# Patient Record
Sex: Female | Born: 1975 | Race: White | Hispanic: No | Smoking: Former smoker
Health system: Southern US, Community
[De-identification: ages and names within clinical notes are randomized; demographics above are authoritative.]

## PROBLEM LIST (undated history)

## (undated) DIAGNOSIS — F3130 Bipolar disorder, current episode depressed, mild or moderate severity, unspecified: Secondary | ICD-10-CM

## (undated) DIAGNOSIS — F329 Major depressive disorder, single episode, unspecified: Secondary | ICD-10-CM

## (undated) DIAGNOSIS — F419 Anxiety disorder, unspecified: Secondary | ICD-10-CM

## (undated) DIAGNOSIS — F32A Depression, unspecified: Secondary | ICD-10-CM

## (undated) HISTORY — PX: LAPAROSCOPIC GASTRIC SLEEVE RESECTION: SHX5895

## (undated) HISTORY — PX: TONSILLECTOMY: SUR1361

## (undated) HISTORY — PX: CHOLECYSTECTOMY: SHX55

---

## 2005-01-30 ENCOUNTER — Ambulatory Visit: Payer: Self-pay | Admitting: Family Medicine

## 2005-02-28 ENCOUNTER — Ambulatory Visit: Payer: Self-pay | Admitting: Family Medicine

## 2006-12-25 ENCOUNTER — Ambulatory Visit: Payer: Self-pay | Admitting: Family Medicine

## 2010-11-09 ENCOUNTER — Ambulatory Visit (HOSPITAL_COMMUNITY)
Admission: RE | Admit: 2010-11-09 | Discharge: 2010-11-09 | Disposition: A | Discharge status: Home / Self Care | Payer: BLUE CROSS/BLUE SHIELD | Source: Ambulatory Visit | Attending: Family Medicine | Admitting: Family Medicine

## 2010-11-09 ENCOUNTER — Other Ambulatory Visit (HOSPITAL_COMMUNITY): Payer: Self-pay | Admitting: Family Medicine

## 2010-11-09 DIAGNOSIS — R1032 Left lower quadrant pain: Secondary | ICD-10-CM

## 2010-11-09 DIAGNOSIS — K7689 Other specified diseases of liver: Secondary | ICD-10-CM | POA: Insufficient documentation

## 2010-11-09 DIAGNOSIS — K838 Other specified diseases of biliary tract: Secondary | ICD-10-CM | POA: Insufficient documentation

## 2010-11-12 ENCOUNTER — Other Ambulatory Visit (HOSPITAL_COMMUNITY): Payer: Self-pay | Admitting: Family Medicine

## 2010-11-12 DIAGNOSIS — K921 Melena: Secondary | ICD-10-CM

## 2010-11-20 ENCOUNTER — Encounter (HOSPITAL_COMMUNITY)
Admission: RE | Admit: 2010-11-20 | Discharge: 2010-11-20 | Disposition: A | Discharge status: Home / Self Care | Payer: BLUE CROSS/BLUE SHIELD | Source: Ambulatory Visit | Attending: Family Medicine | Admitting: Family Medicine

## 2010-11-20 DIAGNOSIS — K921 Melena: Secondary | ICD-10-CM

## 2010-11-20 DIAGNOSIS — K838 Other specified diseases of biliary tract: Secondary | ICD-10-CM | POA: Insufficient documentation

## 2010-11-20 DIAGNOSIS — R109 Unspecified abdominal pain: Secondary | ICD-10-CM | POA: Insufficient documentation

## 2010-11-20 MED ORDER — SINCALIDE 5 MCG IJ SOLR: 0.0200 ug/kg | Freq: Once | INTRAMUSCULAR | Status: DC

## 2010-11-20 MED ORDER — TECHNETIUM TC 99M MEBROFENIN IV KIT: 5.5000 | PACK | Freq: Once | INTRAVENOUS | Status: AC | PRN

## 2012-08-17 ENCOUNTER — Emergency Department: Payer: Self-pay | Admitting: Emergency Medicine

## 2012-08-17 LAB — COMPREHENSIVE METABOLIC PANEL
Albumin: 3 g/dL — ABNORMAL LOW (ref 3.4–5.0)
Bilirubin,Total: 0.8 mg/dL (ref 0.2–1.0)
Co2: 26 mmol/L (ref 21–32)
Creatinine: 0.69 mg/dL (ref 0.60–1.30)
EGFR (African American): >60
EGFR (Non-African Amer.): >60
Glucose: 97 mg/dL (ref 65–99)
Osmolality: 280 (ref 275–301)
SGOT(AST): 40 U/L — ABNORMAL HIGH (ref 15–37)
Sodium: 141 mmol/L (ref 136–145)
Total Protein: 7.3 g/dL (ref 6.4–8.2)

## 2012-08-17 LAB — URINALYSIS, COMPLETE
Bacteria: NONE SEEN
Bilirubin,UR: NEGATIVE
Ketone: NEGATIVE
Leukocyte Esterase: NEGATIVE
Nitrite: NEGATIVE
Protein: NEGATIVE
RBC,UR: 15 /HPF (ref 0–5)
Squamous Epithelial: <1

## 2012-08-17 LAB — CBC
HCT: 38.5 % (ref 35.0–47.0)
MCV: 87 fL (ref 80–100)
RDW: 12.9 % (ref 11.5–14.5)

## 2012-09-22 ENCOUNTER — Emergency Department: Payer: Self-pay

## 2012-09-22 LAB — CBC
HGB: 13.1 g/dL (ref 12.0–16.0)
MCH: 28.8 pg (ref 26.0–34.0)
MCHC: 32.5 g/dL (ref 32.0–36.0)
MCV: 89 fL (ref 80–100)
RBC: 4.56 10*6/uL (ref 3.80–5.20)
WBC: 7.2 10*3/uL (ref 3.6–11.0)

## 2012-09-22 LAB — COMPREHENSIVE METABOLIC PANEL
Albumin: 3.5 g/dL (ref 3.4–5.0)
Anion Gap: 5 — ABNORMAL LOW (ref 7–16)
BUN: 13 mg/dL (ref 7–18)
Bilirubin,Total: 1.3 mg/dL — ABNORMAL HIGH (ref 0.2–1.0)
Calcium, Total: 8.7 mg/dL (ref 8.5–10.1)
Co2: 29 mmol/L (ref 21–32)
EGFR (African American): >60
EGFR (Non-African Amer.): >60
Glucose: 132 mg/dL — ABNORMAL HIGH (ref 65–99)
Potassium: 3.4 mmol/L — ABNORMAL LOW (ref 3.5–5.1)
Sodium: 140 mmol/L (ref 136–145)

## 2012-09-22 LAB — URINALYSIS, COMPLETE
Glucose,UR: NEGATIVE mg/dL (ref 0–75)
Ketone: NEGATIVE
Nitrite: NEGATIVE
Ph: 5 (ref 4.5–8.0)
Protein: NEGATIVE
RBC,UR: 2 /HPF (ref 0–5)
Squamous Epithelial: 9

## 2012-09-22 LAB — LIPASE, BLOOD: Lipase: 84 U/L (ref 73–393)

## 2014-02-22 ENCOUNTER — Encounter: Payer: BC Managed Care – PPO | Admitting: Family Medicine

## 2014-09-19 ENCOUNTER — Emergency Department: Payer: Self-pay | Admitting: Emergency Medicine

## 2015-04-04 ENCOUNTER — Encounter: Payer: Self-pay | Admitting: Emergency Medicine

## 2015-04-04 ENCOUNTER — Emergency Department
Admission: EM | Admit: 2015-04-04 | Discharge: 2015-04-04 | Disposition: A | Discharge status: Home / Self Care | Payer: Self-pay | Attending: Emergency Medicine | Admitting: Emergency Medicine

## 2015-04-04 DIAGNOSIS — F1994 Other psychoactive substance use, unspecified with psychoactive substance-induced mood disorder: Secondary | ICD-10-CM

## 2015-04-04 DIAGNOSIS — F32A Depression, unspecified: Secondary | ICD-10-CM

## 2015-04-04 DIAGNOSIS — T424X1A Poisoning by benzodiazepines, accidental (unintentional), initial encounter: Secondary | ICD-10-CM

## 2015-04-04 DIAGNOSIS — F1310 Sedative, hypnotic or anxiolytic abuse, uncomplicated: Secondary | ICD-10-CM | POA: Insufficient documentation

## 2015-04-04 DIAGNOSIS — F10920 Alcohol use, unspecified with intoxication, uncomplicated: Secondary | ICD-10-CM

## 2015-04-04 DIAGNOSIS — F10120 Alcohol abuse with intoxication, uncomplicated: Secondary | ICD-10-CM | POA: Insufficient documentation

## 2015-04-04 DIAGNOSIS — Y998 Other external cause status: Secondary | ICD-10-CM | POA: Insufficient documentation

## 2015-04-04 DIAGNOSIS — Y9389 Activity, other specified: Secondary | ICD-10-CM | POA: Insufficient documentation

## 2015-04-04 DIAGNOSIS — N390 Urinary tract infection, site not specified: Secondary | ICD-10-CM | POA: Insufficient documentation

## 2015-04-04 DIAGNOSIS — F329 Major depressive disorder, single episode, unspecified: Secondary | ICD-10-CM

## 2015-04-04 DIAGNOSIS — Z3202 Encounter for pregnancy test, result negative: Secondary | ICD-10-CM | POA: Insufficient documentation

## 2015-04-04 DIAGNOSIS — F3130 Bipolar disorder, current episode depressed, mild or moderate severity, unspecified: Secondary | ICD-10-CM | POA: Insufficient documentation

## 2015-04-04 DIAGNOSIS — Z720 Tobacco use: Secondary | ICD-10-CM | POA: Insufficient documentation

## 2015-04-04 DIAGNOSIS — Z046 Encounter for general psychiatric examination, requested by authority: Secondary | ICD-10-CM

## 2015-04-04 DIAGNOSIS — Y9289 Other specified places as the place of occurrence of the external cause: Secondary | ICD-10-CM | POA: Insufficient documentation

## 2015-04-04 DIAGNOSIS — F1010 Alcohol abuse, uncomplicated: Secondary | ICD-10-CM

## 2015-04-04 DIAGNOSIS — T424X2A Poisoning by benzodiazepines, intentional self-harm, initial encounter: Secondary | ICD-10-CM | POA: Insufficient documentation

## 2015-04-04 HISTORY — DX: Major depressive disorder, single episode, unspecified: F32.9

## 2015-04-04 HISTORY — DX: Depression, unspecified: F32.A

## 2015-04-04 HISTORY — DX: Bipolar disorder, current episode depressed, mild or moderate severity, unspecified: F31.30

## 2015-04-04 HISTORY — DX: Anxiety disorder, unspecified: F41.9

## 2015-04-04 LAB — CBC WITH DIFFERENTIAL/PLATELET
Basophils Absolute: 0 10*3/uL (ref 0–0.1)
Basophils Relative: 1 %
EOS ABS: 0.1 10*3/uL (ref 0–0.7)
EOS PCT: 3 %
HCT: 42.3 % (ref 35.0–47.0)
Hemoglobin: 14.3 g/dL (ref 12.0–16.0)
LYMPHS ABS: 1.9 10*3/uL (ref 1.0–3.6)
Lymphocytes Relative: 42 %
MCH: 29.6 pg (ref 26.0–34.0)
MCHC: 33.7 g/dL (ref 32.0–36.0)
MCV: 87.9 fL (ref 80.0–100.0)
MONO ABS: 0.3 10*3/uL (ref 0.2–0.9)
MONOS PCT: 7 %
Neutro Abs: 2.1 10*3/uL (ref 1.4–6.5)
Neutrophils Relative %: 47 %
PLATELETS: 127 10*3/uL — AB (ref 150–440)
RBC: 4.81 MIL/uL (ref 3.80–5.20)
RDW: 15.4 % — ABNORMAL HIGH (ref 11.5–14.5)
WBC: 4.5 10*3/uL (ref 3.6–11.0)

## 2015-04-04 LAB — URINE DRUG SCREEN, QUALITATIVE (ARMC ONLY)
Amphetamines, Ur Screen: NOT DETECTED
Barbiturates, Ur Screen: NOT DETECTED
Benzodiazepine, Ur Scrn: POSITIVE — AB
Cannabinoid 50 Ng, Ur ~~LOC~~: NOT DETECTED
Cocaine Metabolite,Ur ~~LOC~~: NOT DETECTED
MDMA (Ecstasy)Ur Screen: NOT DETECTED
Methadone Scn, Ur: NOT DETECTED
Opiate, Ur Screen: NOT DETECTED
Phencyclidine (PCP) Ur S: NOT DETECTED
Tricyclic, Ur Screen: NOT DETECTED

## 2015-04-04 LAB — URINALYSIS COMPLETE WITH MICROSCOPIC (ARMC ONLY)
Bacteria, UA: NONE SEEN
Bilirubin Urine: NEGATIVE
Glucose, UA: NEGATIVE mg/dL
Ketones, ur: NEGATIVE mg/dL
Nitrite: NEGATIVE
Protein, ur: NEGATIVE mg/dL
Specific Gravity, Urine: 1.003 — ABNORMAL LOW (ref 1.005–1.030)
pH: 6 (ref 5.0–8.0)

## 2015-04-04 LAB — COMPREHENSIVE METABOLIC PANEL
ALT: 16 U/L (ref 14–54)
AST: 24 U/L (ref 15–41)
Albumin: 3.7 g/dL (ref 3.5–5.0)
Alkaline Phosphatase: 76 U/L (ref 38–126)
Anion gap: 6 (ref 5–15)
BUN: 9 mg/dL (ref 6–20)
CHLORIDE: 107 mmol/L (ref 101–111)
CO2: 27 mmol/L (ref 22–32)
CREATININE: 0.72 mg/dL (ref 0.44–1.00)
Calcium: 9.1 mg/dL (ref 8.9–10.3)
GFR calc Af Amer: >60 mL/min (ref >60)
GLUCOSE: 96 mg/dL (ref 65–99)
Potassium: 3.9 mmol/L (ref 3.5–5.1)
Sodium: 140 mmol/L (ref 135–145)
Total Bilirubin: 0.6 mg/dL (ref 0.3–1.2)
Total Protein: 7 g/dL (ref 6.5–8.1)

## 2015-04-04 LAB — PREGNANCY, URINE: Preg Test, Ur: NEGATIVE

## 2015-04-04 LAB — ACETAMINOPHEN LEVEL: Acetaminophen (Tylenol), Serum: <10 ug/mL — ABNORMAL LOW (ref 10–30)

## 2015-04-04 LAB — SALICYLATE LEVEL: Salicylate Lvl: <4 mg/dL (ref 2.8–30.0)

## 2015-04-04 LAB — ETHANOL: ALCOHOL ETHYL (B): 194 mg/dL — AB (ref <5)

## 2015-04-04 MED ORDER — SODIUM CHLORIDE 0.9 % IV SOLN
Freq: Once | INTRAVENOUS | Status: AC
  Administered 2015-04-04: 09:00:00 via INTRAVENOUS

## 2015-04-04 MED ORDER — SODIUM CHLORIDE 0.9 % IV BOLUS (SEPSIS)
1000.0000 mL | INTRAVENOUS | Status: AC
  Administered 2015-04-04: 1000 mL via INTRAVENOUS

## 2015-04-04 MED ORDER — CEPHALEXIN 500 MG PO CAPS: 500.0000 mg | ORAL_CAPSULE | Freq: Two times a day (BID) | ORAL | Status: AC

## 2015-04-04 MED ORDER — CEPHALEXIN 500 MG PO CAPS
500.0000 mg | ORAL_CAPSULE | Freq: Two times a day (BID) | ORAL | Status: DC
  Administered 2015-04-04: 500 mg via ORAL
  Filled 2015-04-04 (×2): qty 1

## 2015-04-04 NOTE — ED Notes (Signed)
Ok per dr Darnelle Catalan to remove pt from monitor and place in [place].

## 2015-04-04 NOTE — ED Notes (Signed)
BEHAVIORAL HEALTH ROUNDING Patient sleeping: No. Patient alert and oriented: yes Behavior appropriate: Yes.  ;  Nutrition and fluids offered: Yes  Toileting and hygiene offered: Yes  Sitter present: yes Law enforcement present: Yes  

## 2015-04-04 NOTE — ED Notes (Signed)
Pt resting quietly in stretcher. NAD noted. RR Even and nonlabored. Pt assisted on bedpan. Will continue to monitor.

## 2015-04-04 NOTE — ED Notes (Signed)
Pt resting in bed quietly in no distress 

## 2015-04-04 NOTE — BH Specialist Note (Signed)
TTS unable to complete triage assessment at this time. Patient is unable to stay awake to complete assessment.

## 2015-04-04 NOTE — ED Notes (Signed)
BEHAVIORAL HEALTH ROUNDING Patient sleeping: No. Patient alert and oriented: yes Behavior appropriate: Yes.   Nutrition and fluids offered: Yes  Toileting and hygiene offered: Yes  Sitter present: yes Law enforcement present: Yes   Pt resting in bed quietly, pt awake and alert  ENVIRONMENTAL ASSESSMENT Potentially harmful objects out of patient reach: Yes.   Personal belongings secured: Yes.   Patient dressed in hospital provided attire only: Yes.   Plastic bags out of patient reach: Yes.   Patient care equipment (cords, cables, call bells, lines, and drains) shortened, removed, or accounted for: Yes.   Equipment and supplies removed from bottom of stretcher: Yes.   Potentially toxic materials out of patient reach: Yes.   Sharps container removed or out of patient reach: Yes.

## 2015-04-04 NOTE — ED Notes (Signed)
Pt's husband, Genevie Cheshire, can be reached at [phone] or [phone].

## 2015-04-04 NOTE — ED Notes (Signed)
BEHAVIORAL HEALTH ROUNDING Patient sleeping: No. Patient alert and oriented: yes Behavior appropriate: Yes.  ; If no, describe:  Nutrition and fluids offered: Yes  Toileting and hygiene offered: No Sitter present: no Law enforcement present: Yes  

## 2015-04-04 NOTE — ED Notes (Signed)
Husband- Milady Fleener [phone]

## 2015-04-04 NOTE — ED Notes (Signed)
Report given to Mary, rN. 

## 2015-04-04 NOTE — Discharge Instructions (Signed)
Alcohol Use Disorder °Alcohol use disorder is a mental disorder. It is not a one-time incident of heavy drinking. Alcohol use disorder is the excessive and uncontrollable use of alcohol over time that leads to problems with functioning in one or more areas of daily living. People with this disorder risk harming themselves and others when they drink to excess. Alcohol use disorder also can cause other mental disorders, such as mood and anxiety disorders, and serious physical problems. People with alcohol use disorder often misuse other drugs.  °Alcohol use disorder is common and widespread. Some people with this disorder drink alcohol to cope with or escape from negative life events. Others drink to relieve chronic pain or symptoms of mental illness. People with a family history of alcohol use disorder are at higher risk of losing control and using alcohol to excess.  °SYMPTOMS  °Signs and symptoms of alcohol use disorder may include the following:  °· Consumption of alcohol in larger amounts or over a longer period of time than intended. °· Multiple unsuccessful attempts to cut down or control alcohol use.   °· A great deal of time spent obtaining alcohol, using alcohol, or recovering from the effects of alcohol (hangover). °· A strong desire or urge to use alcohol (cravings).   °· Continued use of alcohol despite problems at work, school, or home because of alcohol use.   °· Continued use of alcohol despite problems in relationships because of alcohol use. °· Continued use of alcohol in situations when it is physically hazardous, such as driving a car. °· Continued use of alcohol despite awareness of a physical or psychological problem that is likely related to alcohol use. Physical problems related to alcohol use can involve the brain, heart, liver, stomach, and intestines. Psychological problems related to alcohol use include intoxication, depression, anxiety, psychosis, delirium, and dementia.   °· The need for  increased amounts of alcohol to achieve the same desired effect, or a decreased effect from the consumption of the same amount of alcohol (tolerance). °· Withdrawal symptoms upon reducing or stopping alcohol use, or alcohol use to reduce or avoid withdrawal symptoms. Withdrawal symptoms include: °· Racing heart. °· Hand tremor. °· Difficulty sleeping. °· Nausea. °· Vomiting. °· Hallucinations. °· Restlessness. °· Seizures. °DIAGNOSIS °Alcohol use disorder is diagnosed through an assessment by your health care provider. Your health care provider may start by asking three or four questions to screen for excessive or problematic alcohol use. To confirm a diagnosis of alcohol use disorder, at least two symptoms must be present within a 12-month period. The severity of alcohol use disorder depends on the number of symptoms: °· Mild--two or three. °· Moderate--four or five. °· Severe--six or more. °Your health care provider may perform a physical exam or use results from lab tests to see if you have physical problems resulting from alcohol use. Your health care provider may refer you to a mental health professional for evaluation. °TREATMENT  °Some people with alcohol use disorder are able to reduce their alcohol use to low-risk levels. Some people with alcohol use disorder need to quit drinking alcohol. When necessary, mental health professionals with specialized training in substance use treatment can help. Your health care provider can help you decide how severe your alcohol use disorder is and what type of treatment you need. The following forms of treatment are available:  °· Detoxification. Detoxification involves the use of prescription medicines to prevent alcohol withdrawal symptoms in the first week after quitting. This is important for people with a history of symptoms   of withdrawal and for heavy drinkers who are likely to have withdrawal symptoms. Alcohol withdrawal can be dangerous and, in severe cases, cause  death. Detoxification is usually provided in a hospital or in-patient substance use treatment facility.  Counseling or talk therapy. Talk therapy is provided by substance use treatment counselors. It addresses the reasons people use alcohol and ways to keep them from drinking again. The goals of talk therapy are to help people with alcohol use disorder find healthy activities and ways to cope with life stress, to identify and avoid triggers for alcohol use, and to handle cravings, which can cause relapse.  Medicines.Different medicines can help treat alcohol use disorder through the following actions:  Decrease alcohol cravings.  Decrease the positive reward response felt from alcohol use.  Produce an uncomfortable physical reaction when alcohol is used (aversion therapy).  Support groups. Support groups are run by people who have quit drinking. They provide emotional support, advice, and guidance. These forms of treatment are often combined. Some people with alcohol use disorder benefit from intensive combination treatment provided by specialized substance use treatment centers. Both inpatient and outpatient treatment programs are available. Document Released: 08/21/2004 Document Revised: 11/28/2013 Document Reviewed: 10/21/2012 [facility] Patient Information 2015 [place], [place]. This information is not intended to replace advice given to you by your health care provider. Make sure you discuss any questions you have with your health care provider.  Depression Depression refers to feeling sad, low, down in the dumps, blue, gloomy, or empty. In general, there are two kinds of depression:  Normal sadness or normal grief. This kind of depression is one that we all feel from time to time after upsetting life experiences, such as the loss of a job or the ending of a relationship. This kind of depression is considered normal, is short lived, and resolves within a few days to 2 weeks. Depression  experienced after the loss of a loved one (bereavement) often lasts longer than 2 weeks but normally gets better with time.  Clinical depression. This kind of depression lasts longer than normal sadness or normal grief or interferes with your ability to function at home, at work, and in school. It also interferes with your personal relationships. It affects almost every aspect of your life. Clinical depression is an illness. Symptoms of depression can also be caused by conditions other than those mentioned above, such as:  Physical illness. Some physical illnesses, including underactive thyroid gland (hypothyroidism), severe anemia, specific types of cancer, diabetes, uncontrolled seizures, heart and lung problems, strokes, and chronic pain are commonly associated with symptoms of depression.  Side effects of some prescription medicine. In some people, certain types of medicine can cause symptoms of depression.  Substance abuse. Abuse of alcohol and illicit drugs can cause symptoms of depression. SYMPTOMS Symptoms of normal sadness and normal grief include the following:  Feeling sad or crying for short periods of time.  Not caring about anything (apathy).  Difficulty sleeping or sleeping too much.  No longer able to enjoy the things you used to enjoy.  Desire to be by oneself all the time (social isolation).  Lack of energy or motivation.  Difficulty concentrating or remembering.  Change in appetite or weight.  Restlessness or agitation. Symptoms of clinical depression include the same symptoms of normal sadness or normal grief and also the following symptoms:  Feeling sad or crying all the time.  Feelings of guilt or worthlessness.  Feelings of hopelessness or helplessness.  Thoughts of suicide or  the desire to harm yourself (suicidal ideation).  Loss of touch with reality (psychotic symptoms). Seeing or hearing things that are not real (hallucinations) or having false  beliefs about your life or the people around you (delusions and paranoia). DIAGNOSIS  The diagnosis of clinical depression is usually based on how bad the symptoms are and how long they have lasted. Your health care provider will also ask you questions about your medical history and substance use to find out if physical illness, use of prescription medicine, or substance abuse is causing your depression. Your health care provider may also order blood tests. TREATMENT  Often, normal sadness and normal grief do not require treatment. However, sometimes antidepressant medicine is given for bereavement to ease the depressive symptoms until they resolve. The treatment for clinical depression depends on how bad the symptoms are but often includes antidepressant medicine, counseling with a mental health professional, or both. Your health care provider will help to determine what treatment is best for you. Depression caused by physical illness usually goes away with appropriate medical treatment of the illness. If prescription medicine is causing depression, talk with your health care provider about stopping the medicine, decreasing the dose, or changing to another medicine. Depression caused by the abuse of alcohol or illicit drugs goes away when you stop using these substances. Some adults need professional help in order to stop drinking or using drugs. SEEK IMMEDIATE MEDICAL CARE IF:  You have thoughts about hurting yourself or others.  You lose touch with reality (have psychotic symptoms).  You are taking medicine for depression and have a serious side effect. FOR MORE INFORMATION  National Alliance on Mental Illness: www.nami.[employer] of Mental Health: http://www.maynard.net/ Document Released: 07/11/2000 Document Revised: 11/28/2013 Document Reviewed: 10/13/2011 [facility] Patient Information 2015 [place], [place]. This information is not intended to replace advice given to you by your  health care provider. Make sure you discuss any questions you have with your health care provider.  Urinary Tract Infection Urinary tract infections (UTIs) can develop anywhere along your urinary tract. Your urinary tract is your body's drainage system for removing wastes and extra water. Your urinary tract includes two kidneys, two ureters, a bladder, and a urethra. Your kidneys are a pair of bean-shaped organs. Each kidney is about the size of your fist. They are located below your ribs, one on each side of your spine. CAUSES Infections are caused by microbes, which are microscopic organisms, including fungi, viruses, and bacteria. These organisms are so small that they can only be seen through a microscope. Bacteria are the microbes that most commonly cause UTIs. SYMPTOMS  Symptoms of UTIs may vary by age and gender of the patient and by the location of the infection. Symptoms in young women typically include a frequent and intense urge to urinate and a painful, burning feeling in the bladder or urethra during urination. Older women and men are more likely to be tired, shaky, and weak and have muscle aches and abdominal pain. A fever may mean the infection is in your kidneys. Other symptoms of a kidney infection include pain in your back or sides below the ribs, nausea, and vomiting. DIAGNOSIS To diagnose a UTI, your caregiver will ask you about your symptoms. Your caregiver also will ask to provide a urine sample. The urine sample will be tested for bacteria and white blood cells. White blood cells are made by your body to help fight infection. TREATMENT  Typically, UTIs can be treated with medication.  Because most UTIs are caused by a bacterial infection, they usually can be treated with the use of antibiotics. The choice of antibiotic and length of treatment depend on your symptoms and the type of bacteria causing your infection. HOME CARE INSTRUCTIONS  If you were prescribed antibiotics, take  them exactly as your caregiver instructs you. Finish the medication even if you feel better after you have only taken some of the medication.  Drink enough water and fluids to keep your urine clear or pale yellow.  Avoid caffeine, tea, and carbonated beverages. They tend to irritate your bladder.  Empty your bladder often. Avoid holding urine for long periods of time.  Empty your bladder before and after sexual intercourse.  After a bowel movement, women should cleanse from front to back. Use each tissue only once. SEEK MEDICAL CARE IF:   You have back pain.  You develop a fever.  Your symptoms do not begin to resolve within 3 days. SEEK IMMEDIATE MEDICAL CARE IF:   You have severe back pain or lower abdominal pain.  You develop chills.  You have nausea or vomiting.  You have continued burning or discomfort with urination. MAKE SURE YOU:   Understand these instructions.  Will watch your condition.  Will get help right away if you are not doing well or get worse. Document Released: 04/23/2005 Document Revised: 01/13/2012 Document Reviewed: 08/22/2011 [facility] Patient Information 2015 [place], [place]. This information is not intended to replace advice given to you by your health care provider. Make sure you discuss any questions you have with your health care provider.

## 2015-04-04 NOTE — ED Notes (Signed)
BEHAVIORAL HEALTH ROUNDING Patient sleeping: Yes.   Patient alert and oriented: not applicable Behavior appropriate: Yes.  ; If no, describe:  Nutrition and fluids offered: Yes  and No Toileting and hygiene offered: No Sitter present: no Law enforcement present: Yes

## 2015-04-04 NOTE — ED Notes (Addendum)

## 2015-04-04 NOTE — ED Notes (Signed)
BEHAVIORAL HEALTH ROUNDING Patient sleeping: No. Patient alert and oriented: yes Behavior appropriate: Yes.  ; If no, describe:  Nutrition and fluids offered: Yes  Toileting and hygiene offered: Yes  Sitter present: no Law enforcement present: Yes  

## 2015-04-04 NOTE — ED Notes (Signed)
Pt given phone for husband's phone call.

## 2015-04-04 NOTE — ED Notes (Signed)
IV not removed; wrong pt

## 2015-04-04 NOTE — ED Notes (Signed)
Pt given phone to call her workplace.

## 2015-04-04 NOTE — ED Notes (Signed)
BEHAVIORAL HEALTH ROUNDING Patient sleeping: Yes.   Patient alert and oriented: not applicable Behavior appropriate: Yes.  ; If no, describe:  Nutrition and fluids offered: Yes  Toileting and hygiene offered: No Sitter present: no Law enforcement present: Yes  

## 2015-04-04 NOTE — Consult Note (Signed)
[facility] Consult   Reason for Consult:  This is a consult for this 39 year old woman who came in to the hospital after overdosing on Xanax Referring Physician:  Cinda Quest Patient Identification: Brandy Cooper MRN:  [id number] Principal Diagnosis: Substance induced mood disorder Diagnosis:   Patient Active Problem List   Diagnosis Date Noted  . Substance induced mood disorder [F19.94] 04/04/2015  . Alcohol abuse [F10.10] 04/04/2015  . Benzodiazepine overdose [T42.4X1A] 04/04/2015    Total Time spent with patient: 1 hour  Subjective:   Brandy Cooper is a 39 y.o. female patient admitted with "I just did something stupid last night".  HPI:  Patient came into the hospital after overdosing on Xanax. She states that she and her husband were drinking and he "dared her" to take her entire bottle of Xanax. She says that she thinks that he was just kidding about it but she was so drunk she impulsively agreed to do it. She says she swallowed her entire bottle of Xanax tablets and then went to try and throw them up. She was unable to get herself to vomit and eventually she passed out in the bathroom at which point it sounds like her husband called 911. Patient says that she drinks about one or 2 times a week and minimizes the amount of it. Claims that she only had 2 drinks last night. She denies that she abuses her Xanax or abuses any other drugs. She says that she has not been feeling depressed. Denies any suicidal thoughts. Denies any psychotic symptoms.  Past psychiatric history: No previous psychiatric hospitalization. No history of suicide attempts or violence. She says that she is treated for depression and anxiety by her primary care doctor. She takes Wellbutrin during the day and Xanax as needed at night to help her sleep. History of psychotic symptoms.  Substance abuse history: Patient minimizes the degree to this but obviously this degree of drinking last night was  more than what she thought. She had a blood alcohol level almost 200 even presumably a couple hours after stopping drinking. Patient denies any history of DTs or seizures. Denies any other substance abuse. Denies ever being in any kind of rehabilitation program.  Medical history: Takes metformin for hyperglycemia or mild diabetes. Denies any other active medical problems.  Social history: Patient is married lives with her husband. She works daytime hours. Does not feel like her life is particularly stressful. She feels like she has a good relationship with her husband.  Current medications Wellbutrin Xanax and metformin by her report  Family history: Denies any family history of substance abuse or mental health problems. HPI Elements:   Quality:  Overdose on Xanax and alcohol. Severity:  The overdoses potentially fatal in the right circumstance. Timing:  Happened last night. Duration:  Seems to be resolved at this point. Context:  Alcohol abuse and intoxication..  Past Medical History:  Past Medical History  Diagnosis Date  . Anxiety   . Depression   . Bipolar affect, depressed     Past Surgical History  Procedure Laterality Date  . Laparoscopic gastric sleeve resection     Family History: No family history on file. Social History:  History  Alcohol Use  . Yes     History  Drug Use No    Social History   Social History  . Marital Status: Married    Spouse Name: N/A  . Number of Children: N/A  . Years of Education: N/A   Social  History Main Topics  . Smoking status: Current Some Day Smoker    Types: Cigarettes  . Smokeless tobacco: None  . Alcohol Use: Yes  . Drug Use: No  . Sexual Activity: Not Asked   Other Topics Concern  . None   Social History Narrative  . None   Additional Social History:    Pain Medications: None Reported Prescriptions: None Reported Over the Counter: None Reported History of alcohol / drug use?: Yes Longest period of sobriety  (when/how long):  (Unknown) Negative Consequences of Use: Personal relationships Withdrawal Symptoms:  (None Reported) Name of Substance 1: Alcohol 1 - Age of First Use: Teenager 1 - Amount (size/oz): 5th of liquor 1 - Frequency: Once every other week 1 - Duration: Several Years 1 - Last Use / Amount: 04/04/2015                   Allergies:  No Known Allergies  Labs:  Results for orders placed or performed during the hospital encounter of 04/04/15 (from the past 48 hour(s))  Acetaminophen level     Status: Abnormal   Collection Time: 04/04/15 12:36 AM  Result Value Ref Range   Acetaminophen (Tylenol), Serum <10 (L) 10 - 30 ug/mL    Comment:        THERAPEUTIC CONCENTRATIONS VARY SIGNIFICANTLY. A RANGE OF 10-30 ug/mL MAY BE AN EFFECTIVE CONCENTRATION FOR MANY PATIENTS. HOWEVER, SOME ARE BEST TREATED AT CONCENTRATIONS OUTSIDE THIS RANGE. ACETAMINOPHEN CONCENTRATIONS >150 ug/mL AT 4 HOURS AFTER INGESTION AND >50 ug/mL AT 12 HOURS AFTER INGESTION ARE OFTEN ASSOCIATED WITH TOXIC REACTIONS.   Comprehensive metabolic panel     Status: None   Collection Time: 04/04/15 12:36 AM  Result Value Ref Range   Sodium 140 135 - 145 mmol/L   Potassium 3.9 3.5 - 5.1 mmol/L   Chloride 107 101 - 111 mmol/L   CO2 27 22 - 32 mmol/L   Glucose, Bld 96 65 - 99 mg/dL   BUN 9 6 - 20 mg/dL   Creatinine, Ser 0.72 0.44 - 1.00 mg/dL   Calcium 9.1 8.9 - 10.3 mg/dL   Total Protein 7.0 6.5 - 8.1 g/dL   Albumin 3.7 3.5 - 5.0 g/dL   AST 24 15 - 41 U/L   ALT 16 14 - 54 U/L   Alkaline Phosphatase 76 38 - 126 U/L   Total Bilirubin 0.6 0.3 - 1.2 mg/dL   GFR calc non Af Amer >60 >60 mL/min   GFR calc Af Amer >60 >60 mL/min    Comment: (NOTE) The eGFR has been calculated using the CKD EPI equation. This calculation has not been validated in all clinical situations. eGFR's persistently <60 mL/min signify possible Chronic Kidney Disease.    Anion gap 6 5 - 15  CBC WITH DIFFERENTIAL     Status:  Abnormal   Collection Time: 04/04/15 12:36 AM  Result Value Ref Range   WBC 4.5 3.6 - 11.0 K/uL   RBC 4.81 3.80 - 5.20 MIL/uL   Hemoglobin 14.3 12.0 - 16.0 g/dL   HCT 42.3 35.0 - 47.0 %   MCV 87.9 80.0 - 100.0 fL   MCH 29.6 26.0 - 34.0 pg   MCHC 33.7 32.0 - 36.0 g/dL   RDW 15.4 (H) 11.5 - 14.5 %   Platelets 127 (L) 150 - 440 K/uL   Neutrophils Relative % 47 %   Neutro Abs 2.1 1.4 - 6.5 K/uL   Lymphocytes Relative 42 %   Lymphs Abs 1.9  1.0 - 3.6 K/uL   Monocytes Relative 7 %   Monocytes Absolute 0.3 0.2 - 0.9 K/uL   Eosinophils Relative 3 %   Eosinophils Absolute 0.1 0 - 0.7 K/uL   Basophils Relative 1 %   Basophils Absolute 0.0 0 - 0.1 K/uL  Ethanol     Status: Abnormal   Collection Time: 04/04/15 12:36 AM  Result Value Ref Range   Alcohol, Ethyl (B) 194 (H) <5 mg/dL    Comment:        LOWEST DETECTABLE LIMIT FOR SERUM ALCOHOL IS 5 mg/dL FOR MEDICAL PURPOSES ONLY   Salicylate level     Status: None   Collection Time: 04/04/15 12:36 AM  Result Value Ref Range   Salicylate Lvl <9.7 2.8 - 30.0 mg/dL  Urine Drug Screen, Qualitative ([facility] only)     Status: Abnormal   Collection Time: 04/04/15  3:02 AM  Result Value Ref Range   Tricyclic, Ur Screen NONE DETECTED NONE DETECTED   Amphetamines, Ur Screen NONE DETECTED NONE DETECTED   MDMA (Ecstasy)Ur Screen NONE DETECTED NONE DETECTED   Cocaine Metabolite,Ur [place] NONE DETECTED NONE DETECTED   Opiate, Ur Screen NONE DETECTED NONE DETECTED   Phencyclidine (PCP) Ur S NONE DETECTED NONE DETECTED   Cannabinoid 50 Ng, Ur [place] NONE DETECTED NONE DETECTED   Barbiturates, Ur Screen NONE DETECTED NONE DETECTED   Benzodiazepine, Ur Scrn POSITIVE (A) NONE DETECTED   Methadone Scn, Ur NONE DETECTED NONE DETECTED    Comment: (NOTE) 948  Tricyclics, urine               Cutoff 1000 ng/mL 200  Amphetamines, urine             Cutoff 1000 ng/mL 300  MDMA (Ecstasy), urine           Cutoff 500 ng/mL 400  Cocaine Metabolite, urine       Cutoff 300  ng/mL 500  Opiate, urine                   Cutoff 300 ng/mL 600  Phencyclidine (PCP), urine      Cutoff 25 ng/mL 700  Cannabinoid, urine              Cutoff 50 ng/mL 800  Barbiturates, urine             Cutoff 200 ng/mL 900  Benzodiazepine, urine           Cutoff 200 ng/mL 1000 Methadone, urine                Cutoff 300 ng/mL 1100 1200 The urine drug screen provides only a preliminary, unconfirmed 1300 analytical test result and should not be used for non-medical 1400 purposes. Clinical consideration and professional judgment should 1500 be applied to any positive drug screen result due to possible 1600 interfering substances. A more specific alternate chemical method 1700 must be used in order to obtain a confirmed analytical result.  1800 Gas chromato graphy / mass spectrometry (GC/MS) is the preferred 1900 confirmatory method.   Urinalysis complete, with microscopic ([facility] only)     Status: Abnormal   Collection Time: 04/04/15  3:02 AM  Result Value Ref Range   Color, Urine STRAW (A) YELLOW   APPearance CLEAR (A) CLEAR   Glucose, UA NEGATIVE NEGATIVE mg/dL   Bilirubin Urine NEGATIVE NEGATIVE   Ketones, ur NEGATIVE NEGATIVE mg/dL   Specific Gravity, Urine 1.003 (L) 1.005 - 1.030   Hgb urine dipstick 2+ (A)  NEGATIVE   pH 6.0 5.0 - 8.0   Protein, ur NEGATIVE NEGATIVE mg/dL   Nitrite NEGATIVE NEGATIVE   Leukocytes, UA 2+ (A) NEGATIVE   RBC / HPF 0-5 0 - 5 RBC/hpf   WBC, UA 6-30 0 - 5 WBC/hpf   Bacteria, UA NONE SEEN NONE SEEN   Squamous Epithelial / LPF 0-5 (A) NONE SEEN  Pregnancy, urine     Status: None   Collection Time: 04/04/15  3:02 AM  Result Value Ref Range   Preg Test, Ur NEGATIVE NEGATIVE    Vitals: Blood pressure 101/48, pulse 98, temperature 97.8 F (36.6 C), temperature source Oral, resp. rate 18, height '5\' 6"'  (1.676 m), weight 131.543 kg (290 lb), last menstrual period 03/21/2015, SpO2 100 %.  Risk to Self: Suicidal Ideation: No (Currently denies any  SI) Suicidal Intent: No (pt. denies but she was an intentional overdose.) Is patient at risk for suicide?: Yes Suicidal Plan?: Yes-Currently Present Specify Current Suicidal Plan: Overdose of medications Access to Means: Yes Specify Access to Suicidal Means: Have Medications What has been your use of drugs/alcohol within the last 12 months?: Alcohol How many times?: 1 Other Self Harm Risks: None Reported Triggers for Past Attempts: Family contact, Spouse contact, Other (Comment) Intentional Self Injurious Behavior: None Risk to Others: Homicidal Ideation: No Thoughts of Harm to Others: No Current Homicidal Intent: No Current Homicidal Plan: No Access to Homicidal Means: No Identified Victim: None Reported History of harm to others?: No Assessment of Violence: None Noted Violent Behavior Description: None Reported Does patient have access to weapons?: No Criminal Charges Pending?: No Does patient have a court date: No Prior Inpatient Therapy: Prior Inpatient Therapy: No Prior Therapy Dates: n/a Prior Therapy Facilty/Provider(s): n/a Reason for Treatment: n/a Prior Outpatient Therapy: Prior Outpatient Therapy: Yes Prior Therapy Dates: 2012 Prior Therapy Facilty/Provider(s): [facility] Reason for Treatment: Was seen by Psych MD in order to get gastric bypass Does patient have an ACCT team?: No Does patient have Intensive In-House Services?  : No Does patient have [facility] services? : No Does patient have P4CC services?: No  Current Facility-Administered Medications  Medication Dose Route Frequency Provider Last Rate Last Dose  . cephALEXin (KEFLEX) capsule 500 mg  500 mg Oral Q12H Hinda Kehr, MD   500 mg at 04/04/15 1012   Current Outpatient Prescriptions  Medication Sig Dispense Refill  . ALPRAZolam (XANAX) 1 MG tablet Take 1 mg by mouth at bedtime.    Marland Kitchen buPROPion (WELLBUTRIN XL) 300 MG 24 hr tablet Take 300 mg by mouth daily.    . metFORMIN (GLUCOPHAGE) 500 MG  tablet Take 1,500 mg by mouth daily.     . valACYclovir (VALTREX) 500 MG tablet Take 500 mg by mouth once. As needed      Musculoskeletal: Strength & Muscle Tone: within normal limits Gait & Station: normal Patient leans: N/A  Psychiatric Specialty Exam: Physical Exam  Nursing note and vitals reviewed. Constitutional: She appears well-developed and well-nourished.  HENT:  Head: Normocephalic and atraumatic.  Eyes: Conjunctivae are normal. Pupils are equal, round, and reactive to light.  Neck: Normal range of motion.  Cardiovascular: Normal heart sounds.   Respiratory: Effort normal.  GI: Soft.  Musculoskeletal: Normal range of motion.  Neurological: She is alert.  Skin: Skin is warm and dry.  Psychiatric: She has a normal mood and affect. Her speech is normal and behavior is normal. Thought content normal. Cognition and memory are normal. She expresses impulsivity.    Review  of Systems  Constitutional: Negative.   HENT: Negative.   Eyes: Negative.   Respiratory: Negative.   Cardiovascular: Negative.   Gastrointestinal: Negative.   Musculoskeletal: Negative.   Skin: Negative.   Neurological: Negative.   Psychiatric/Behavioral: Positive for substance abuse. Negative for depression, suicidal ideas, hallucinations and memory loss. The patient is nervous/anxious. The patient does not have insomnia.     Blood pressure 101/48, pulse 98, temperature 97.8 F (36.6 C), temperature source Oral, resp. rate 18, height '5\' 6"'  (1.676 m), weight 131.543 kg (290 lb), last menstrual period 03/21/2015, SpO2 100 %.Body mass index is 46.83 kg/(m^2).  General Appearance: Disheveled  Eye Sport and exercise psychologist::  Fair  Speech:  Clear and Coherent  Volume:  Normal  Mood:  Euthymic  Affect:  Congruent  Thought Process:  Goal Directed  Orientation:  Full (Time, Place, and Person)  Thought Content:  Negative  Suicidal Thoughts:  No  Homicidal Thoughts:  No  Memory:  Immediate;   Good Recent;   Good Remote;    Fair  Judgement:  Impaired  Insight:  Shallow  Psychomotor Activity:  Normal  Concentration:  Fair  Recall:  [place]  Language: Fair  Akathisia:  No  Handed:  Right  AIMS (if indicated):     Assets:  Agricultural consultant Housing Physical Health Social Support  ADL's:  Intact  Cognition: WNL  Sleep:      Medical Decision Making: New problem, with additional work up planned, Review of Psycho-Social Stressors (1), Review or order clinical lab tests (1) and Review of Medication Regimen & Side Effects (2)  Treatment Plan Summary: Plan Patient currently appears to be euthymic and denies any suicidal thoughts. She will be taken off involuntary commitment. She has received some detox medicine but otherwise needs no new prescriptions. Counseling completed regarding the dangers of abuse of Xanax and alcohol together. She is strongly encouraged to consider whether her drinking may be a greater problem than she realizes. Follow-up mental health care otherwise with her primary care doctor. Case discussed with emergency room doctor.  Plan:  No evidence of imminent risk to self or others at present.   Patient does not meet criteria for psychiatric inpatient admission. Supportive therapy provided about ongoing stressors. Discussed crisis plan, support from social network, calling 911, coming to the Emergency Department, and calling Suicide Hotline. Disposition: Discharge at the discretion of emergency room  [place] 04/04/2015 3:53 PM

## 2015-04-04 NOTE — ED Notes (Signed)
Pt changed into burgandy paper scrubs and pt's tshirt, undergarments, bra, necklace and wedding rings placed in labeled belongings bag and placed at nurses station.

## 2015-04-04 NOTE — ED Notes (Signed)
Pt given her belongings, clothes and necklace

## 2015-04-04 NOTE — ED Notes (Signed)
When asked about SI or HI pt states "I cant sleep well at night, that's why I take xanax, and I haven't slept in several nights so my husband said just take them all, I am not a person  Who takes pills or xanax, I only started taking xanax a few years ago when I lost 200 lbs and my ankles hurt at night", pt states she wants to go home, pt denies SI or HI

## 2015-04-04 NOTE — ED Provider Notes (Signed)
-----------------------------------------   4:00 PM on 04/04/2015 -----------------------------------------  Patient has been seen by psychiatry and they believe the patient is safe for discharge home with primary care follow-up with whom she already follows regarding her depression. We will discharge the patient home at this time.  Minna Antis, MD 04/04/15 1600

## 2015-04-04 NOTE — ED Notes (Signed)
Pt arrives via EMS from home. Per EMS report, pt's husband stated pt just had Alprazolam  , 30 pills filled on the 1st. Pt states, "I have been drinking tonight and took some pills to sleep. After I took the pills, I went to the bathroom and threw them up." Pt drowsy and answering questions at this time. NAD noted. RR even and nonlabored.

## 2015-04-04 NOTE — ED Notes (Signed)
Pt assisted to bedside commode. Pt with unsteady gait. Pt assisted back to ED stretcher. Warm blanket provided to patient. Bed remains in lowest position and call bell left within reach.

## 2015-04-04 NOTE — ED Notes (Signed)
Pt with unwitnessed fall. Per staff member, heard a "thump" and saw patient sitting on the floor. NAD noted.

## 2015-04-04 NOTE — ED Notes (Signed)
POC Urine Pregnancy NEGATIVE

## 2015-04-04 NOTE — ED Provider Notes (Signed)
[facility] Emergency Department Provider Note  ____________________________________________  Time seen: Approximately 2:12 AM  I have reviewed the triage vital signs and the nursing notes.   HISTORY  Chief Complaint Drug Overdose  Limited by obtundation  HPI Brandy Cooper is a 39 y.o. female with a history of bipolar disorder/depression who presents by EMS from home.  The husband called EMS because the patient was not very responsive.  She arrived very somnolent but able to talk.  She states that she has been drinking and then took some pills to sleep.  However, during my evaluation of her, she states that she took an entire bottle of Xanax because "my husband wants me dead."  She is not able to provide any additional history due to her obtundation.   Past Medical History  Diagnosis Date  . Anxiety   . Depression   . Bipolar affect, depressed     There are no active problems to display for this patient.   Past Surgical History  Procedure Laterality Date  . Laparoscopic gastric sleeve resection      No current outpatient prescriptions on file.  Allergies Review of patient's allergies indicates no known allergies.  No family history on file.  Social History Social History  Substance Use Topics  . Smoking status: Current Some Day Smoker    Types: Cigarettes  . Smokeless tobacco: None  . Alcohol Use: Yes    Review of Systems Unable to obtain due to obtundation  ____________________________________________   PHYSICAL EXAM:  VITAL SIGNS: ED Triage Vitals  Enc Vitals Group     BP --      Pulse Rate 04/04/15 0040 81     Resp 04/04/15 0040 16     Temp 04/04/15 0040 97.8 F (36.6 C)     Temp Source 04/04/15 0040 Oral     SpO2 04/04/15 0040 97 %     Weight 04/04/15 0036 290 lb (131.543 kg)     Height 04/04/15 0036 5\' 6"  (1.676 m)     Head Cir --      Peak Flow --      Pain Score --      Pain Loc --      Pain Edu? --       Excl. in GC? --     Constitutional: Obtunded. Awakens and localizes to pain, GCS 10 Eyes: Conjunctivae are normal. PERRL. EOMI. Head: Atraumatic. Nose: No congestion/rhinnorhea. Mouth/Throat: Mucous membranes are moist.  Oropharynx non-erythematous. Neck: No stridor.  No cervical spine tenderness to palpation. Protecting airway. Cardiovascular: Normal rate, regular rhythm. Grossly normal heart sounds.  Good peripheral circulation. Respiratory: Normal respiratory effort.  No retractions. Lungs CTAB. Gastrointestinal: Soft and nontender. No distention. No abdominal bruits. No CVA tenderness. Musculoskeletal: No lower extremity tenderness nor edema.  No joint effusions. Neurologic:  Normal speech and language. No gross focal neurologic deficits are appreciated.  Skin:  Skin is warm, dry and intact. No rash noted. Psychiatric: Obtunded, but when she does "come around", she clearly stated that she intentionally took the entire bottle of Xanax in a suicide attempt  ____________________________________________   LABS (all labs ordered are listed, but only abnormal results are displayed)  Labs Reviewed  ACETAMINOPHEN LEVEL - Abnormal; Notable for the following:    Acetaminophen (Tylenol), Serum <10 (*)    All other components within normal limits  CBC WITH DIFFERENTIAL/PLATELET - Abnormal; Notable for the following:    RDW 15.4 (*)    Platelets 127 (*)  All other components within normal limits  URINE DRUG SCREEN, QUALITATIVE ([facility] ONLY) - Abnormal; Notable for the following:    Benzodiazepine, Ur Scrn POSITIVE (*)    All other components within normal limits  ETHANOL - Abnormal; Notable for the following:    Alcohol, Ethyl (B) 194 (*)    All other components within normal limits  URINALYSIS COMPLETEWITH MICROSCOPIC ([facility] ONLY) - Abnormal; Notable for the following:    Color, Urine STRAW (*)    APPearance CLEAR (*)    Specific Gravity, Urine 1.003 (*)    Hgb urine dipstick 2+ (*)     Leukocytes, UA 2+ (*)    Squamous Epithelial / LPF 0-5 (*)    All other components within normal limits  URINE CULTURE  COMPREHENSIVE METABOLIC PANEL  SALICYLATE LEVEL  PREGNANCY, URINE   ____________________________________________  EKG  ED ECG REPORT I, Chloie Loney, the attending physician, personally viewed and interpreted this ECG.  Date: 04/04/2015 EKG Time: 00:37 Rate: 82 Rhythm: normal sinus rhythm QRS Axis: normal Intervals: normal ST/T Wave abnormalities: normal Conduction Disutrbances: none Narrative Interpretation: unremarkable  ____________________________________________  RADIOLOGY   No results found.  ____________________________________________   PROCEDURES  Procedure(s) performed: None  Critical Care performed: No ____________________________________________   INITIAL IMPRESSION / ASSESSMENT AND PLAN / ED COURSE  Pertinent labs & imaging results that were available during my care of the patient were reviewed by me and considered in my medical decision making (see chart for details).  GCS 10, protecting airway, heavily intoxicated.  Will continue to monitor on the cardiac monitor and continuous pulse ox.   ----------------------------------------- 2:39 AM on 04/04/2015 -----------------------------------------  Both rails were up on the patient's bed.  I had just evaluated her and placed her on a nasal cannula.  She was protecting her airway and was localizing to pain, but is extremely obtunded.  I then was called in by her nurse, Luis.  Apparently the patient crawled out of the bed at the foot of the bed and then fell to the floor.  She is awake but not particularly alert.  She had urinated on the floor and was not able to get her legs under her due to the puddle of urine.  Luis and I assisted her back into the bed.  She has no acute complaints at this point and no deformities to her extremities.  She is moving all of her extremities  independently.  She has no obvious trauma to her head.  We will continue to monitor.  I do not feel that intubation is necessary at this time.  Once again purposefully endorsed taking an overdose of benzodiazepines.  IVC, needs psych consult. ____________________________________________  FINAL CLINICAL IMPRESSION(S) / ED DIAGNOSES  Final diagnoses:  Overdose of benzodiazepine, intentional self-harm, initial encounter  Alcohol intoxication, uncomplicated  Involuntary commitment  UTI (urinary tract infection), uncomplicated      NEW MEDICATIONS STARTED DURING THIS VISIT:  New Prescriptions   No medications on file     Loleta Rose, MD 04/04/15 [phone]

## 2015-04-04 NOTE — BH Assessment (Signed)
Assessment Note  Brandy Cooper is an 39 y.o. female who presents to the ER via EMS, due to ingesting approximately 30 pills of Xanax. According to the patient, she was drinking alcohol and her husband "dared me" to take the pills. Thus, she took the medications. She then attmpted to make her self vomit but was unsuccessful. That is when her husband call 911 and she was brought to the ER.  The main stressor the patient identified is problems in her marriage. She's been married for approximately one year but they have been together for two years. She states, the conflict is due to him not working and not helping with different chores around the house. She states, they both love each other and want the best for each other. She blames herself for the current state of the marriage and reports of having started the process of getting the help she needs to repair the marriage. When asked why she believes she is the cause of the problems, she states, she is "it's because of the way I say things." She further explains, she arguing too much and can be verbally abusive.  Concerning the overdose on medications, patient states, she was never suicidal and never had any intent to harm herself. She states, "this was really dumb. I got drunk and done something stupid."  She denies any past mental health and substance abuse treatment. 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  Upon arrival to the ER, the patient BAC was 194. Her UDS was positive for Benzo's.  Patient currently denies SI/HI and AV/H.   Axis I: Alcohol Abuse and Depressive Disorder NOS Axis III:  Past Medical History  Diagnosis Date  . Anxiety   . Depression   . Bipolar affect, depressed    Axis IV: economic problems, problems related to social environment and problems with primary support group  Past Medical History:  Past Medical History  Diagnosis Date  . Anxiety   .  Depression   . Bipolar affect, depressed     Past Surgical History  Procedure Laterality Date  . Laparoscopic gastric sleeve resection      Family History: No family history on file.  Social History:  reports that she has been smoking Cigarettes.  She does not have any smokeless tobacco history on file. She reports that she drinks alcohol. She reports that she does not use illicit drugs.  Additional Social History:  Alcohol / Drug Use Pain Medications: None Reported Prescriptions: None Reported Over the Counter: None Reported History of alcohol / drug use?: Yes Longest period of sobriety (when/how long):  (Unknown) Negative Consequences of Use: Personal relationships Withdrawal Symptoms:  (None Reported) Substance #1 Name of Substance 1: Alcohol 1 - Age of First Use: Teenager 1 - Amount (size/oz): 5th of liquor 1 - Frequency: Once every other week 1 - Duration: Several Years 1 - Last Use / Amount: 04/04/2015  CIWA: CIWA-Ar BP: (!) 101/48 mmHg Pulse Rate: 98 COWS:    Allergies: No Known Allergies  Home Medications:  (Not in a hospital admission)  OB/GYN Status:  Patient's last menstrual period was 03/21/2015.  General Assessment Data Location of Assessment: [facility] ED TTS Assessment: In system Is this a Tele or Face-to-Face Assessment?: Face-to-Face Is this an Initial Assessment or a Re-assessment for this encounter?: Initial Assessment Marital status: Single Maiden name: n/a Is patient pregnant?: No Pregnancy Status: No Living Arrangements: Spouse/significant  other Can pt return to current living arrangement?: No Admission Status: Involuntary Is patient capable of signing voluntary admission?: No Referral Source: Other Insurance type: Scientist, research (physical sciences) Exam [facility] Walk-in ONLY) Medical Exam completed: Yes  Crisis Care Plan Living Arrangements: Spouse/significant other Name of Psychiatrist: n/a Name of Therapist: n/a  Education Status Is patient  currently in school?: No Current Grade: n/a Highest grade of school patient has completed: Some [employer] Name of school: n/a Contact person: n/a  Risk to self with the past 6 months Suicidal Ideation: No (Currently denies any SI) Has patient been a risk to self within the past 6 months prior to admission? : Yes Suicidal Intent: No (pt. denies but she was an intentional overdose.) Has patient had any suicidal intent within the past 6 months prior to admission? : Yes Is patient at risk for suicide?: Yes Suicidal Plan?: Yes-Currently Present Has patient had any suicidal plan within the past 6 months prior to admission? : Other (comment) Specify Current Suicidal Plan: Overdose of medications Access to Means: Yes Specify Access to Suicidal Means: Have Medications What has been your use of drugs/alcohol within the last 12 months?: Alcohol Previous Attempts/Gestures: Yes How many times?: 1 Other Self Harm Risks: None Reported Triggers for Past Attempts: Family contact, Spouse contact, Other (Comment) Intentional Self Injurious Behavior: None Family Suicide History: Unknown Recent stressful life event(s): Other (Comment), Financial Problems, Conflict (Comment) (Martial Problems) Persecutory voices/beliefs?: No Depression: Yes Depression Symptoms: Feeling worthless/self pity Substance abuse history and/or treatment for substance abuse?: Yes Suicide prevention information given to non-admitted patients: Not applicable  Risk to Others within the past 6 months Homicidal Ideation: No Does patient have any lifetime risk of violence toward others beyond the six months prior to admission? : No Thoughts of Harm to Others: No Current Homicidal Intent: No Current Homicidal Plan: No Access to Homicidal Means: No Identified Victim: None Reported History of harm to others?: No Assessment of Violence: None Noted Violent Behavior Description: None Reported Does patient have access to weapons?:  No Criminal Charges Pending?: No Does patient have a court date: No Is patient on probation?: No  Psychosis Hallucinations: None noted Delusions: None noted  Mental Status Report Appearance/Hygiene: In scrubs, In hospital gown Eye Contact: Poor Motor Activity: Freedom of movement Speech: Logical/coherent, Unremarkable Level of Consciousness: Alert Mood: Depressed, Anxious, Pleasant Affect: Anxious, Appropriate to circumstance, Sad Anxiety Level: Minimal Thought Processes: Coherent, Relevant Judgement: Impaired Orientation: Person, Place, Time, Situation, Appropriate for developmental age Obsessive Compulsive Thoughts/Behaviors: Minimal  Cognitive Functioning Concentration: Normal Memory: Recent Impaired, Remote Intact IQ: Average Insight: Poor Impulse Control: Poor Appetite: Fair Weight Loss: 200 (Intentional Lost weight, Has had gastric bypass, 2012.) Weight Gain: 0 Sleep: No Change Total Hours of Sleep: 8 Vegetative Symptoms: None  ADLScreening [facility] Assessment Services) Patient's cognitive ability adequate to safely complete daily activities?: Yes Patient able to express need for assistance with ADLs?: Yes Independently performs ADLs?: Yes (appropriate for developmental age)  Prior Inpatient Therapy Prior Inpatient Therapy: No Prior Therapy Dates: n/a Prior Therapy Facilty/Provider(s): n/a Reason for Treatment: n/a  Prior Outpatient Therapy Prior Outpatient Therapy: Yes Prior Therapy Dates: 2012 Prior Therapy Facilty/Provider(s): [employer] [facility] Reason for Treatment: Was seen by Psych MD in order to get gastric bypass Does patient have an ACCT team?: No Does patient have Intensive In-House Services?  : No Does patient have [facility] services? : No Does patient have P4CC services?: No  ADL Screening (condition at time of admission) Patient's cognitive ability  adequate to safely complete daily activities?: Yes Patient able to express need for assistance  with ADLs?: Yes Independently performs ADLs?: Yes (appropriate for developmental age)       Abuse/Neglect Assessment (Assessment to be complete while patient is alone) Physical Abuse: Denies Verbal Abuse: Denies Sexual Abuse: Denies Exploitation of patient/patient's resources: Denies Self-Neglect: Denies Values / Beliefs Cultural Requests During Hospitalization: None Spiritual Requests During Hospitalization: None Consults Spiritual Care Consult Needed: No Social Work Consult Needed: No Merchant navy officer (For Healthcare) Does patient have an advance directive?: No Would patient like information on creating an advanced directive?: No - patient declined information    Additional Information 1:1 In Past 12 Months?: No CIRT Risk: No Elopement Risk: No Does patient have medical clearance?: Yes  Child/Adolescent Assessment Running Away Risk: Denies (Patient is an adult)  Disposition:  Disposition Initial Assessment Completed for this Encounter: Yes Disposition of Patient: Other dispositions (Psych MD to see) Other disposition(s): Other (Comment) (Psych MD to see)  On Site Evaluation by:   Reviewed with Physician:    Lilyan Gilford, MS, LCAS, LPC, NCC, CCSI 04/04/2015 1:15 PM

## 2015-04-05 LAB — URINE CULTURE: Culture: 4000

## 2015-05-14 ENCOUNTER — Other Ambulatory Visit: Payer: Self-pay | Admitting: Physician Assistant

## 2015-05-14 DIAGNOSIS — Z9884 Bariatric surgery status: Secondary | ICD-10-CM

## 2015-05-18 ENCOUNTER — Other Ambulatory Visit: Payer: Self-pay | Admitting: Physician Assistant

## 2015-05-18 ENCOUNTER — Ambulatory Visit
Admission: RE | Admit: 2015-05-18 | Discharge: 2015-05-18 | Disposition: A | Discharge status: Home / Self Care | Payer: BLUE CROSS/BLUE SHIELD | Source: Ambulatory Visit | Attending: Physician Assistant | Admitting: Physician Assistant

## 2015-05-18 DIAGNOSIS — Z9884 Bariatric surgery status: Secondary | ICD-10-CM

## 2015-07-09 ENCOUNTER — Other Ambulatory Visit: Payer: Self-pay | Admitting: Family Medicine

## 2015-07-09 ENCOUNTER — Ambulatory Visit
Admission: RE | Admit: 2015-07-09 | Discharge: 2015-07-09 | Disposition: A | Discharge status: Home / Self Care | Payer: BLUE CROSS/BLUE SHIELD | Source: Ambulatory Visit | Attending: Family Medicine | Admitting: Family Medicine

## 2015-07-09 DIAGNOSIS — M19072 Primary osteoarthritis, left ankle and foot: Secondary | ICD-10-CM | POA: Insufficient documentation

## 2015-07-09 DIAGNOSIS — S93402A Sprain of unspecified ligament of left ankle, initial encounter: Secondary | ICD-10-CM | POA: Insufficient documentation

## 2015-07-09 DIAGNOSIS — W19XXXA Unspecified fall, initial encounter: Secondary | ICD-10-CM | POA: Insufficient documentation

## 2017-05-20 ENCOUNTER — Emergency Department: Payer: BLUE CROSS/BLUE SHIELD

## 2017-05-20 ENCOUNTER — Encounter: Payer: Self-pay | Admitting: Emergency Medicine

## 2017-05-20 ENCOUNTER — Emergency Department
Admission: EM | Admit: 2017-05-20 | Discharge: 2017-05-20 | Disposition: A | Discharge status: Home / Self Care | Payer: BLUE CROSS/BLUE SHIELD | Attending: Emergency Medicine | Admitting: Emergency Medicine

## 2017-05-20 DIAGNOSIS — Z5321 Procedure and treatment not carried out due to patient leaving prior to being seen by health care provider: Secondary | ICD-10-CM | POA: Diagnosis not present

## 2017-05-20 DIAGNOSIS — R05 Cough: Secondary | ICD-10-CM | POA: Insufficient documentation

## 2017-05-20 DIAGNOSIS — R079 Chest pain, unspecified: Secondary | ICD-10-CM | POA: Diagnosis not present

## 2017-05-20 DIAGNOSIS — R071 Chest pain on breathing: Secondary | ICD-10-CM | POA: Diagnosis not present

## 2017-05-20 LAB — CBC
HCT: 45.8 % (ref 35.0–47.0)
HEMOGLOBIN: 15.4 g/dL (ref 12.0–16.0)
MCH: 34.8 pg — AB (ref 26.0–34.0)
MCHC: 33.7 g/dL (ref 32.0–36.0)
MCV: 103.3 fL — ABNORMAL HIGH (ref 80.0–100.0)
PLATELETS: 111 10*3/uL — AB (ref 150–440)
RBC: 4.44 MIL/uL (ref 3.80–5.20)
RDW: 18.1 % — AB (ref 11.5–14.5)
WBC: 3.7 10*3/uL (ref 3.6–11.0)

## 2017-05-20 LAB — BASIC METABOLIC PANEL
Anion gap: 9 (ref 5–15)
BUN: 12 mg/dL (ref 6–20)
CO2: 27 mmol/L (ref 22–32)
CREATININE: 0.68 mg/dL (ref 0.44–1.00)
Calcium: 9.1 mg/dL (ref 8.9–10.3)
Chloride: 101 mmol/L (ref 101–111)
GFR calc Af Amer: >60 mL/min (ref >60)
GFR calc non Af Amer: >60 mL/min (ref >60)
GLUCOSE: 104 mg/dL — AB (ref 65–99)
Potassium: 4.9 mmol/L (ref 3.5–5.1)
Sodium: 137 mmol/L (ref 135–145)

## 2017-05-20 LAB — TROPONIN I: Troponin I: <0.03 ng/mL (ref <0.03)

## 2017-05-20 NOTE — ED Triage Notes (Signed)
Pt to ED c/o CP x1 week with SOB.  States fell 1 week ago.  Pain is to mid right chest, sharp, worse with palpation and moving and deep breaths.  States productive cough, no color to sputum.

## 2017-05-20 NOTE — ED Notes (Addendum)
Pt up to first nurse desk asking how much longer for her xray.  This nurse called radiology and informed patient that she would be seen shortly.  Patient states I'm not staying and walked out.  Patient not listening to this nurse as she walked out.

## 2017-05-21 ENCOUNTER — Telehealth: Payer: Self-pay | Admitting: Emergency Medicine

## 2017-05-21 NOTE — Telephone Encounter (Signed)
Called patient due to lwot to inquire about condition and follow up plans. Number not in service. 

## 2017-10-20 ENCOUNTER — Other Ambulatory Visit: Payer: Self-pay

## 2017-10-20 ENCOUNTER — Emergency Department
Admission: EM | Admit: 2017-10-20 | Discharge: 2017-10-20 | Disposition: A | Discharge status: Home / Self Care | Payer: BLUE CROSS/BLUE SHIELD | Attending: Emergency Medicine | Admitting: Emergency Medicine

## 2017-10-20 ENCOUNTER — Encounter: Payer: Self-pay | Admitting: Emergency Medicine

## 2017-10-20 ENCOUNTER — Emergency Department: Payer: BLUE CROSS/BLUE SHIELD

## 2017-10-20 DIAGNOSIS — S7001XA Contusion of right hip, initial encounter: Secondary | ICD-10-CM | POA: Diagnosis not present

## 2017-10-20 DIAGNOSIS — Y998 Other external cause status: Secondary | ICD-10-CM | POA: Insufficient documentation

## 2017-10-20 DIAGNOSIS — Y9389 Activity, other specified: Secondary | ICD-10-CM | POA: Diagnosis not present

## 2017-10-20 DIAGNOSIS — Z87891 Personal history of nicotine dependence: Secondary | ICD-10-CM | POA: Diagnosis not present

## 2017-10-20 DIAGNOSIS — F419 Anxiety disorder, unspecified: Secondary | ICD-10-CM | POA: Insufficient documentation

## 2017-10-20 DIAGNOSIS — W0110XA Fall on same level from slipping, tripping and stumbling with subsequent striking against unspecified object, initial encounter: Secondary | ICD-10-CM | POA: Diagnosis not present

## 2017-10-20 DIAGNOSIS — Z9884 Bariatric surgery status: Secondary | ICD-10-CM | POA: Insufficient documentation

## 2017-10-20 DIAGNOSIS — Z9049 Acquired absence of other specified parts of digestive tract: Secondary | ICD-10-CM | POA: Diagnosis not present

## 2017-10-20 DIAGNOSIS — Y929 Unspecified place or not applicable: Secondary | ICD-10-CM | POA: Insufficient documentation

## 2017-10-20 DIAGNOSIS — S79911A Unspecified injury of right hip, initial encounter: Secondary | ICD-10-CM | POA: Diagnosis present

## 2017-10-20 DIAGNOSIS — S301XXA Contusion of abdominal wall, initial encounter: Secondary | ICD-10-CM

## 2017-10-20 DIAGNOSIS — F319 Bipolar disorder, unspecified: Secondary | ICD-10-CM | POA: Diagnosis not present

## 2017-10-20 MED ORDER — DIAZEPAM 5 MG PO TABS: 5.0000 mg | ORAL_TABLET | Freq: Three times a day (TID) | ORAL | 0 refills | Status: AC | PRN

## 2017-10-20 NOTE — ED Provider Notes (Signed)
[facility] Emergency Department Provider Note       Time seen: ----------------------------------------- 9:10 AM on 10/20/2017 -----------------------------------------   I have reviewed the triage vital signs and the nursing notes.  HISTORY   Chief Complaint Fall    HPI Brandy Cooper is a 42 y.o. female with a history of anxiety, bipolar, depression, alcohol abuse, substance abuse who presents to the ED for a fall.  Patient states she fell in December and has been having some difficulty walking with back pain.  She then fell again on Saturday and is having pain in the right hip area.  She also states she hit her head but denies any loss of consciousness at that time.  She was seen by her primary care doctor after the initial fall and has been referred to neurology but she missed that appointment.  Pain is moderate in the right hip.  Past Medical History:  Diagnosis Date  . Anxiety   . Bipolar affect, depressed (HCC)   . Depression     Patient Active Problem List   Diagnosis Date Noted  . Substance induced mood disorder (HCC) 04/04/2015  . Alcohol abuse 04/04/2015  . Benzodiazepine overdose 04/04/2015    Past Surgical History:  Procedure Laterality Date  . CHOLECYSTECTOMY    . LAPAROSCOPIC GASTRIC SLEEVE RESECTION    . TONSILLECTOMY      Allergies Celebrex [celecoxib] and Morphine and related  Social History Social History   Tobacco Use  . Smoking status: Former Smoker    Types: Cigarettes  . Smokeless tobacco: Never Used  Substance Use Topics  . Alcohol use: No  . Drug use: No   Review of Systems Constitutional: Negative for fever. Cardiovascular: Negative for chest pain. Respiratory: Negative for shortness of breath. Gastrointestinal: Negative for abdominal pain, vomiting and diarrhea. Genitourinary: Negative for dysuria. Musculoskeletal: Positive right hip pain Skin: Negative for rash. Neurological: Negative for headaches,  focal weakness or numbness.  All systems negative/normal/unremarkable except as stated in the HPI  ____________________________________________   PHYSICAL EXAM:  VITAL SIGNS: ED Triage Vitals  Enc Vitals Group     BP 10/20/17 0823 (!) 110/49     Pulse Rate 10/20/17 0823 (!) 112     Resp 10/20/17 0823 20     Temp 10/20/17 0823 98.4 F (36.9 C)     Temp Source 10/20/17 0823 Oral     SpO2 10/20/17 0823 100 %     Weight 10/20/17 0818 245 lb (111.1 kg)     Height 10/20/17 0818 5\' 6"  (1.676 m)     Head Circumference --      Peak Flow --      Pain Score 10/20/17 0817 10     Pain Loc --      Pain Edu? --      Excl. in GC? --    Constitutional: Alert and oriented. Well appearing and in no distress. Cardiovascular: Normal rate, regular rhythm. No murmurs, rubs, or gallops. Respiratory: Normal respiratory effort without tachypnea nor retractions. Breath sounds are clear and equal bilaterally. No wheezes/rales/rhonchi. Gastrointestinal: Soft and nontender. Normal bowel sounds Musculoskeletal: Pain with range of motion of the right hip.  Tenderness over the right iliac crest Neurologic:  Normal speech and language. No gross focal neurologic deficits are appreciated.  Skin:  Skin is warm, dry and intact. No rash noted. Psychiatric: Mood and affect are normal. Speech and behavior are normal.  ____________________________________________  ED COURSE:  As part of my medical decision  making, I reviewed the following data within the electronic MEDICAL RECORD NUMBER History obtained from family if available, nursing notes, old chart and ekg, as well as notes from prior ED visits. Patient presented for right hip pain after a fall, we will assess with imaging as indicated at this time.   Procedures ____________________________________________   RADIOLOGY Images were viewed by me  Right hip x-rays Are unremarkable ____________________________________________  DIFFERENTIAL DIAGNOSIS    Contusion, fracture, muscle strain  FINAL ASSESSMENT AND PLAN  Right hip contusion   Plan: The patient had presented for right hip pain after a fall. Patient's imaging was negative for any acute process.  All of her tenderness seems to be located over the right iliac crest suggesting a hip pointer type injury.  She will be discharged with pain medicine and is cleared for outpatient follow-up.   Ulice DashJohnathan E Eliga Arvie, MD   Note: This note was generated in part or whole with voice recognition software. Voice recognition is usually quite accurate but there are transcription errors that can and very often do occur. I apologize for any typographical errors that were not detected and corrected.     Emily FilbertWilliams, Rashi Granier E, MD 10/20/17 [phone]

## 2017-10-20 NOTE — ED Notes (Signed)
See triage note  States she fell in Dec and has been having some unsteady gait and back pain  Then fell again on Saturday   Having pain to right lateral hip area. Also states she hit her head and denies any LOC at that time  Has been seen by her PCP concerning the initial fall and has been referred to neuro in [place]  States she missed that appt  Also having some issues with her bowels

## 2017-10-20 NOTE — ED Notes (Signed)

## 2017-10-20 NOTE — ED Notes (Signed)
[facility]   Moved to room 11

## 2017-10-20 NOTE — ED Triage Notes (Signed)
Pt in via POV; reports mechanical fall from deck on Saturday, landing on back, reports hitting head on concrete, denies LOC.  Pt with complaints of lower back and right hip pain.  NAD noted at this time.

## 2017-11-06 ENCOUNTER — Emergency Department: Payer: BLUE CROSS/BLUE SHIELD

## 2017-11-06 ENCOUNTER — Inpatient Hospital Stay
Admission: EM | Admit: 2017-11-06 | Discharge: 2017-11-25 | DRG: 432 | Disposition: E | Payer: BLUE CROSS/BLUE SHIELD | Attending: Internal Medicine | Admitting: Internal Medicine

## 2017-11-06 ENCOUNTER — Other Ambulatory Visit: Payer: Self-pay

## 2017-11-06 ENCOUNTER — Encounter: Payer: Self-pay | Admitting: Emergency Medicine

## 2017-11-06 DIAGNOSIS — Z7189 Other specified counseling: Secondary | ICD-10-CM | POA: Diagnosis not present

## 2017-11-06 DIAGNOSIS — Z66 Do not resuscitate: Secondary | ICD-10-CM | POA: Diagnosis present

## 2017-11-06 DIAGNOSIS — I130 Hypertensive heart and chronic kidney disease with heart failure and stage 1 through stage 4 chronic kidney disease, or unspecified chronic kidney disease: Secondary | ICD-10-CM | POA: Diagnosis present

## 2017-11-06 DIAGNOSIS — F22 Delusional disorders: Secondary | ICD-10-CM | POA: Diagnosis not present

## 2017-11-06 DIAGNOSIS — R55 Syncope and collapse: Secondary | ICD-10-CM

## 2017-11-06 DIAGNOSIS — R05 Cough: Secondary | ICD-10-CM

## 2017-11-06 DIAGNOSIS — D688 Other specified coagulation defects: Secondary | ICD-10-CM | POA: Diagnosis not present

## 2017-11-06 DIAGNOSIS — K838 Other specified diseases of biliary tract: Secondary | ICD-10-CM | POA: Diagnosis present

## 2017-11-06 DIAGNOSIS — K7682 Hepatic encephalopathy: Secondary | ICD-10-CM

## 2017-11-06 DIAGNOSIS — N179 Acute kidney failure, unspecified: Secondary | ICD-10-CM | POA: Diagnosis present

## 2017-11-06 DIAGNOSIS — K5900 Constipation, unspecified: Secondary | ICD-10-CM

## 2017-11-06 DIAGNOSIS — Z5329 Procedure and treatment not carried out because of patient's decision for other reasons: Secondary | ICD-10-CM | POA: Diagnosis not present

## 2017-11-06 DIAGNOSIS — Z6841 Body Mass Index (BMI) 40.0 and over, adult: Secondary | ICD-10-CM | POA: Diagnosis not present

## 2017-11-06 DIAGNOSIS — L304 Erythema intertrigo: Secondary | ICD-10-CM | POA: Diagnosis not present

## 2017-11-06 DIAGNOSIS — B251 Cytomegaloviral hepatitis: Secondary | ICD-10-CM | POA: Diagnosis present

## 2017-11-06 DIAGNOSIS — F19950 Other psychoactive substance use, unspecified with psychoactive substance-induced psychotic disorder with delusions: Secondary | ICD-10-CM | POA: Diagnosis not present

## 2017-11-06 DIAGNOSIS — N760 Acute vaginitis: Secondary | ICD-10-CM | POA: Diagnosis present

## 2017-11-06 DIAGNOSIS — E876 Hypokalemia: Secondary | ICD-10-CM | POA: Diagnosis present

## 2017-11-06 DIAGNOSIS — F1010 Alcohol abuse, uncomplicated: Secondary | ICD-10-CM | POA: Diagnosis not present

## 2017-11-06 DIAGNOSIS — Z9049 Acquired absence of other specified parts of digestive tract: Secondary | ICD-10-CM

## 2017-11-06 DIAGNOSIS — K7040 Alcoholic hepatic failure without coma: Secondary | ICD-10-CM | POA: Diagnosis present

## 2017-11-06 DIAGNOSIS — K7031 Alcoholic cirrhosis of liver with ascites: Principal | ICD-10-CM | POA: Diagnosis present

## 2017-11-06 DIAGNOSIS — Y92230 Patient room in hospital as the place of occurrence of the external cause: Secondary | ICD-10-CM

## 2017-11-06 DIAGNOSIS — R748 Abnormal levels of other serum enzymes: Secondary | ICD-10-CM | POA: Diagnosis not present

## 2017-11-06 DIAGNOSIS — Z7984 Long term (current) use of oral hypoglycemic drugs: Secondary | ICD-10-CM

## 2017-11-06 DIAGNOSIS — Z9089 Acquired absence of other organs: Secondary | ICD-10-CM

## 2017-11-06 DIAGNOSIS — Z886 Allergy status to analgesic agent status: Secondary | ICD-10-CM

## 2017-11-06 DIAGNOSIS — K7460 Unspecified cirrhosis of liver: Secondary | ICD-10-CM

## 2017-11-06 DIAGNOSIS — R410 Disorientation, unspecified: Secondary | ICD-10-CM

## 2017-11-06 DIAGNOSIS — R627 Adult failure to thrive: Secondary | ICD-10-CM | POA: Diagnosis present

## 2017-11-06 DIAGNOSIS — K7011 Alcoholic hepatitis with ascites: Secondary | ICD-10-CM | POA: Diagnosis present

## 2017-11-06 DIAGNOSIS — N189 Chronic kidney disease, unspecified: Secondary | ICD-10-CM | POA: Diagnosis present

## 2017-11-06 DIAGNOSIS — E1122 Type 2 diabetes mellitus with diabetic chronic kidney disease: Secondary | ICD-10-CM | POA: Diagnosis present

## 2017-11-06 DIAGNOSIS — D520 Dietary folate deficiency anemia: Secondary | ICD-10-CM | POA: Diagnosis present

## 2017-11-06 DIAGNOSIS — D696 Thrombocytopenia, unspecified: Secondary | ICD-10-CM | POA: Diagnosis not present

## 2017-11-06 DIAGNOSIS — Z885 Allergy status to narcotic agent status: Secondary | ICD-10-CM

## 2017-11-06 DIAGNOSIS — Z9884 Bariatric surgery status: Secondary | ICD-10-CM

## 2017-11-06 DIAGNOSIS — T380X5A Adverse effect of glucocorticoids and synthetic analogues, initial encounter: Secondary | ICD-10-CM | POA: Diagnosis not present

## 2017-11-06 DIAGNOSIS — I5033 Acute on chronic diastolic (congestive) heart failure: Secondary | ICD-10-CM | POA: Diagnosis present

## 2017-11-06 DIAGNOSIS — J9601 Acute respiratory failure with hypoxia: Secondary | ICD-10-CM | POA: Diagnosis not present

## 2017-11-06 DIAGNOSIS — R7989 Other specified abnormal findings of blood chemistry: Secondary | ICD-10-CM

## 2017-11-06 DIAGNOSIS — R059 Cough, unspecified: Secondary | ICD-10-CM

## 2017-11-06 DIAGNOSIS — K7290 Hepatic failure, unspecified without coma: Secondary | ICD-10-CM | POA: Diagnosis not present

## 2017-11-06 DIAGNOSIS — R2681 Unsteadiness on feet: Secondary | ICD-10-CM | POA: Diagnosis present

## 2017-11-06 DIAGNOSIS — R319 Hematuria, unspecified: Secondary | ICD-10-CM | POA: Diagnosis present

## 2017-11-06 DIAGNOSIS — E282 Polycystic ovarian syndrome: Secondary | ICD-10-CM | POA: Diagnosis present

## 2017-11-06 DIAGNOSIS — K7030 Alcoholic cirrhosis of liver without ascites: Secondary | ICD-10-CM | POA: Diagnosis not present

## 2017-11-06 DIAGNOSIS — E669 Obesity, unspecified: Secondary | ICD-10-CM | POA: Diagnosis present

## 2017-11-06 DIAGNOSIS — Z975 Presence of (intrauterine) contraceptive device: Secondary | ICD-10-CM

## 2017-11-06 DIAGNOSIS — N939 Abnormal uterine and vaginal bleeding, unspecified: Secondary | ICD-10-CM | POA: Diagnosis not present

## 2017-11-06 DIAGNOSIS — B9689 Other specified bacterial agents as the cause of diseases classified elsewhere: Secondary | ICD-10-CM

## 2017-11-06 DIAGNOSIS — I959 Hypotension, unspecified: Secondary | ICD-10-CM | POA: Diagnosis present

## 2017-11-06 DIAGNOSIS — K7200 Acute and subacute hepatic failure without coma: Secondary | ICD-10-CM | POA: Diagnosis not present

## 2017-11-06 DIAGNOSIS — F319 Bipolar disorder, unspecified: Secondary | ICD-10-CM | POA: Diagnosis present

## 2017-11-06 DIAGNOSIS — Z560 Unemployment, unspecified: Secondary | ICD-10-CM

## 2017-11-06 DIAGNOSIS — M545 Low back pain: Secondary | ICD-10-CM | POA: Diagnosis present

## 2017-11-06 DIAGNOSIS — F05 Delirium due to known physiological condition: Secondary | ICD-10-CM

## 2017-11-06 DIAGNOSIS — Z515 Encounter for palliative care: Secondary | ICD-10-CM | POA: Diagnosis present

## 2017-11-06 DIAGNOSIS — Z79899 Other long term (current) drug therapy: Secondary | ICD-10-CM

## 2017-11-06 DIAGNOSIS — M549 Dorsalgia, unspecified: Secondary | ICD-10-CM

## 2017-11-06 DIAGNOSIS — G4700 Insomnia, unspecified: Secondary | ICD-10-CM | POA: Diagnosis present

## 2017-11-06 DIAGNOSIS — E60 Dietary zinc deficiency: Secondary | ICD-10-CM | POA: Diagnosis present

## 2017-11-06 DIAGNOSIS — R945 Abnormal results of liver function studies: Secondary | ICD-10-CM

## 2017-11-06 DIAGNOSIS — Z635 Disruption of family by separation and divorce: Secondary | ICD-10-CM

## 2017-11-06 DIAGNOSIS — F17211 Nicotine dependence, cigarettes, in remission: Secondary | ICD-10-CM | POA: Diagnosis present

## 2017-11-06 DIAGNOSIS — D539 Nutritional anemia, unspecified: Secondary | ICD-10-CM | POA: Diagnosis present

## 2017-11-06 DIAGNOSIS — Z9181 History of falling: Secondary | ICD-10-CM

## 2017-11-06 DIAGNOSIS — F418 Other specified anxiety disorders: Secondary | ICD-10-CM | POA: Diagnosis present

## 2017-11-06 DIAGNOSIS — R000 Tachycardia, unspecified: Secondary | ICD-10-CM | POA: Diagnosis present

## 2017-11-06 DIAGNOSIS — K7010 Alcoholic hepatitis without ascites: Secondary | ICD-10-CM | POA: Diagnosis not present

## 2017-11-06 DIAGNOSIS — E559 Vitamin D deficiency, unspecified: Secondary | ICD-10-CM | POA: Diagnosis present

## 2017-11-06 DIAGNOSIS — R0602 Shortness of breath: Secondary | ICD-10-CM

## 2017-11-06 DIAGNOSIS — E610 Copper deficiency: Secondary | ICD-10-CM | POA: Diagnosis not present

## 2017-11-06 DIAGNOSIS — K709 Alcoholic liver disease, unspecified: Secondary | ICD-10-CM | POA: Diagnosis not present

## 2017-11-06 LAB — URINALYSIS, COMPLETE (UACMP) WITH MICROSCOPIC
Bacteria, UA: NONE SEEN
GLUCOSE, UA: NEGATIVE mg/dL
HGB URINE DIPSTICK: NEGATIVE
Ketones, ur: NEGATIVE mg/dL
LEUKOCYTES UA: NEGATIVE
NITRITE: NEGATIVE
PH: 5 (ref 5.0–8.0)
Protein, ur: NEGATIVE mg/dL
Specific Gravity, Urine: 1.025 (ref 1.005–1.030)

## 2017-11-06 LAB — CHLAMYDIA/NGC RT PCR (ARMC ONLY)
Chlamydia Tr: NOT DETECTED
N GONORRHOEAE: NOT DETECTED

## 2017-11-06 LAB — COMPREHENSIVE METABOLIC PANEL
ALBUMIN: 2.5 g/dL — AB (ref 3.5–5.0)
ALK PHOS: 88 U/L (ref 38–126)
ALT: 23 U/L (ref 14–54)
ANION GAP: 10 (ref 5–15)
AST: 95 U/L — AB (ref 15–41)
BILIRUBIN TOTAL: 6.6 mg/dL — AB (ref 0.3–1.2)
BUN: 16 mg/dL (ref 6–20)
CALCIUM: 8.1 mg/dL — AB (ref 8.9–10.3)
CO2: 22 mmol/L (ref 22–32)
Chloride: 99 mmol/L — ABNORMAL LOW (ref 101–111)
Creatinine, Ser: 1.75 mg/dL — ABNORMAL HIGH (ref 0.44–1.00)
GFR calc Af Amer: 41 mL/min — ABNORMAL LOW (ref >60)
GFR, EST NON AFRICAN AMERICAN: 35 mL/min — AB (ref >60)
GLUCOSE: 126 mg/dL — AB (ref 65–99)
Potassium: 2.9 mmol/L — ABNORMAL LOW (ref 3.5–5.1)
Sodium: 131 mmol/L — ABNORMAL LOW (ref 135–145)
TOTAL PROTEIN: 6.6 g/dL (ref 6.5–8.1)

## 2017-11-06 LAB — RETICULOCYTES
RBC.: 3.2 MIL/uL — AB (ref 3.80–5.20)
Retic Count, Absolute: 73.6 10*3/uL (ref 19.0–183.0)
Retic Ct Pct: 2.3 % (ref 0.4–3.1)

## 2017-11-06 LAB — CBC
HCT: 32.9 % — ABNORMAL LOW (ref 35.0–47.0)
Hemoglobin: 11.7 g/dL — ABNORMAL LOW (ref 12.0–16.0)
MCH: 36.5 pg — ABNORMAL HIGH (ref 26.0–34.0)
MCHC: 35.5 g/dL (ref 32.0–36.0)
MCV: 102.9 fL — ABNORMAL HIGH (ref 80.0–100.0)
Platelets: 124 10*3/uL — ABNORMAL LOW (ref 150–440)
RBC: 3.2 MIL/uL — ABNORMAL LOW (ref 3.80–5.20)
RDW: 25.8 % — AB (ref 11.5–14.5)
WBC: 4.3 10*3/uL (ref 3.6–11.0)

## 2017-11-06 LAB — FERRITIN: Ferritin: 718 ng/mL — ABNORMAL HIGH (ref 11–307)

## 2017-11-06 LAB — IRON AND TIBC
IRON: 94 ug/dL (ref 28–170)
SATURATION RATIOS: 92 % — AB (ref 10.4–31.8)
TIBC: 102 ug/dL — AB (ref 250–450)
UIBC: 9 ug/dL

## 2017-11-06 LAB — VITAMIN B12: Vitamin B-12: 3810 pg/mL — ABNORMAL HIGH (ref 180–914)

## 2017-11-06 LAB — WET PREP, GENITAL
SPERM: NONE SEEN
TRICH WET PREP: NONE SEEN
YEAST WET PREP: NONE SEEN

## 2017-11-06 LAB — HCG, QUANTITATIVE, PREGNANCY: hCG, Beta Chain, Quant, S: <1 m[IU]/mL (ref <5)

## 2017-11-06 LAB — FOLATE: Folate: 2.5 ng/mL — ABNORMAL LOW (ref >5.9)

## 2017-11-06 LAB — POCT PREGNANCY, URINE: Preg Test, Ur: NEGATIVE

## 2017-11-06 MED ORDER — BUPROPION HCL ER (XL) 300 MG PO TB24
300.0000 mg | ORAL_TABLET | Freq: Every day | ORAL | Status: DC
  Administered 2017-11-08 – 2017-11-21 (×14): 300 mg via ORAL
  Filled 2017-11-06: qty 2
  Filled 2017-11-06: qty 1
  Filled 2017-11-06 (×15): qty 2

## 2017-11-06 MED ORDER — OXYCODONE-ACETAMINOPHEN 5-325 MG PO TABS
1.0000 | ORAL_TABLET | Freq: Four times a day (QID) | ORAL | Status: DC | PRN
  Administered 2017-11-06: 1 via ORAL
  Administered 2017-11-06 – 2017-11-08 (×3): 2 via ORAL
  Filled 2017-11-06 (×3): qty 2
  Filled 2017-11-06: qty 1

## 2017-11-06 MED ORDER — DIAZEPAM 5 MG PO TABS
5.0000 mg | ORAL_TABLET | Freq: Three times a day (TID) | ORAL | Status: DC | PRN
  Administered 2017-11-09 – 2017-11-14 (×4): 5 mg via ORAL
  Filled 2017-11-06 (×5): qty 1

## 2017-11-06 MED ORDER — SODIUM CHLORIDE 0.9 % IV BOLUS
1000.0000 mL | Freq: Once | INTRAVENOUS | Status: AC
  Administered 2017-11-06: 1000 mL via INTRAVENOUS

## 2017-11-06 MED ORDER — ACETAMINOPHEN 650 MG RE SUPP
650.0000 mg | Freq: Four times a day (QID) | RECTAL | Status: DC | PRN
Start: 2017-11-06 — End: 2017-11-13

## 2017-11-06 MED ORDER — METRONIDAZOLE 500 MG PO TABS
500.0000 mg | ORAL_TABLET | Freq: Two times a day (BID) | ORAL | Status: AC
  Administered 2017-11-06 – 2017-11-12 (×13): 500 mg via ORAL
  Filled 2017-11-06 (×14): qty 1

## 2017-11-06 MED ORDER — ONDANSETRON HCL 4 MG/2ML IJ SOLN: 4.0000 mg | Freq: Four times a day (QID) | INTRAMUSCULAR | Status: DC | PRN

## 2017-11-06 MED ORDER — POTASSIUM CHLORIDE CRYS ER 20 MEQ PO TBCR
40.0000 meq | EXTENDED_RELEASE_TABLET | Freq: Once | ORAL | Status: AC
  Administered 2017-11-06: 40 meq via ORAL
  Filled 2017-11-06: qty 2

## 2017-11-06 MED ORDER — ALPRAZOLAM 1 MG PO TABS
2.0000 mg | ORAL_TABLET | Freq: Every day | ORAL | Status: DC
  Administered 2017-11-06 – 2017-11-21 (×16): 2 mg via ORAL
  Filled 2017-11-06 (×17): qty 2

## 2017-11-06 MED ORDER — ONDANSETRON HCL 4 MG PO TABS
4.0000 mg | ORAL_TABLET | Freq: Four times a day (QID) | ORAL | Status: DC | PRN
Start: 2017-11-06 — End: 2017-11-22

## 2017-11-06 MED ORDER — METRONIDAZOLE 500 MG PO TABS
500.0000 mg | ORAL_TABLET | Freq: Once | ORAL | Status: AC
  Administered 2017-11-06: 500 mg via ORAL
  Filled 2017-11-06: qty 1

## 2017-11-06 MED ORDER — POTASSIUM CHLORIDE IN NACL 20-0.9 MEQ/L-% IV SOLN
INTRAVENOUS | Status: DC
  Administered 2017-11-06 – 2017-11-07 (×2): via INTRAVENOUS
  Filled 2017-11-06 (×3): qty 1000

## 2017-11-06 MED ORDER — ACETAMINOPHEN 325 MG PO TABS
650.0000 mg | ORAL_TABLET | Freq: Four times a day (QID) | ORAL | Status: DC | PRN
Start: 2017-11-06 — End: 2017-11-13
  Administered 2017-11-08 – 2017-11-12 (×2): 650 mg via ORAL
  Filled 2017-11-06 (×2): qty 2

## 2017-11-06 NOTE — ED Triage Notes (Signed)
Patient reports vaginal bleeding since November and recently reports severe pelvic pain that has caused patient to "double over".  Patient reports soaking through pads faster than once per hour.  Patient appears pale and has a flat affect.  Patient is in no obvious distress at this time.

## 2017-11-06 NOTE — H&P (Addendum)
[employer] - [place] [facility]   PATIENT NAME: Brandy Cooper    MR#:  [id number]  DATE OF BIRTH:  September 30, 1975  DATE OF ADMISSION:  11/24/2017  PRIMARY CARE PHYSICIAN: Dortha KernBliss, Laura K, MD   REQUESTING/REFERRING PHYSICIAN: Dr. Nita Sicklearolina Veronese  CHIEF COMPLAINT:   Chief Complaint  Patient presents with  . Vaginal Bleeding    HISTORY OF PRESENT ILLNESS:  Brandy Cooper  is a 42 y.o. female with a known history of anxiety and depression, status post laparoscopic gastric sleeve resection, dysfunctional uterine bleeding presents to hospital secondary to a fall. Patient has an IUD in place.  For the last 6 months she has been having increased intermittent vaginal bleeding.  Seen at [facility] 3 months ago and supposed to have a follow-up visit last month.  She states she has intermittent episodes of increased bleeding.  Also has PCOS.  She has had a couple of syncopal episodes associated with the bleeding as well.  But today she felt dizzy and lightheaded, has an unsteady gait.  Bleeding was worse over the last couple of days, was hypotensive and brought to the emergency room.  Very flat affect on exam.  Hemoglobin is at 11 compared to 15,  6 months ago.  Labs indicate hypokalemia and acute renal failure.  PAST MEDICAL HISTORY:   Past Medical History:  Diagnosis Date  . Anxiety   . Bipolar affect, depressed (HCC)   . Depression     PAST SURGICAL HISTORY:   Past Surgical History:  Procedure Laterality Date  . CHOLECYSTECTOMY    . LAPAROSCOPIC GASTRIC SLEEVE RESECTION    . TONSILLECTOMY      SOCIAL HISTORY:   Social History   Tobacco Use  . Smoking status: Former Smoker    Types: Cigarettes  . Smokeless tobacco: Never Used  . Tobacco comment: quit since 2 months  Substance Use Topics  . Alcohol use: No    FAMILY HISTORY:   Family History  Family history unknown: Yes    DRUG ALLERGIES:   Allergies  Allergen Reactions  . Celebrex  [Celecoxib] Hives  . Morphine And Related Itching    REVIEW OF SYSTEMS:   Review of Systems  Constitutional: Positive for malaise/fatigue. Negative for chills, fever and weight loss.  HENT: Negative for ear discharge, ear pain, hearing loss and nosebleeds.   Eyes: Negative for blurred vision, double vision and photophobia.  Respiratory: Negative for cough, hemoptysis, shortness of breath and wheezing.   Cardiovascular: Negative for chest pain, palpitations, orthopnea and leg swelling.  Gastrointestinal: Positive for abdominal pain. Negative for constipation, diarrhea, heartburn, melena, nausea and vomiting.  Genitourinary: Negative for dysuria, frequency and urgency.       Vaginal bleeding  Musculoskeletal: Negative for back pain, myalgias and neck pain.  Skin: Negative for rash.  Neurological: Positive for dizziness and weakness. Negative for tingling, tremors, sensory change, speech change, focal weakness and headaches.  Endo/Heme/Allergies: Does not bruise/bleed easily.  Psychiatric/Behavioral: Negative for depression.       Flat affect    MEDICATIONS AT HOME:   Prior to Admission medications   Medication Sig Start Date End Date Taking? Authorizing Provider  ALPRAZolam Prudy Feeler(XANAX) 1 MG tablet Take 2 mg by mouth at bedtime.    Yes [provider]  buPROPion (WELLBUTRIN XL) 300 MG 24 hr tablet Take 300 mg by mouth daily.   Yes [provider]  metFORMIN (GLUCOPHAGE) 500 MG tablet Take 1,500 mg by mouth daily.  Yes [provider]  valACYclovir (VALTREX) 500 MG tablet Take 500 mg by mouth once. As needed   Yes [provider]  cephALEXin (KEFLEX) 500 MG capsule Take 1 capsule (500 mg total) by mouth every 12 (twelve) hours. Patient not taking: Reported on 11/20/17 04/04/15   Minna Antis, MD  diazepam (VALIUM) 5 MG tablet Take 1 tablet (5 mg total) by mouth every 8 (eight) hours as needed for muscle spasms. 10/20/17   Emily Filbert, MD       VITAL SIGNS:  Blood pressure (!) 94/43, pulse (!) 102, temperature 98.1 F (36.7 C), temperature source Oral, resp. rate 18, height 5\' 6"  (1.676 m), weight 113.4 kg (250 lb), SpO2 100 %.  PHYSICAL EXAMINATION:   Physical Exam  GENERAL:  42 y.o.-year-old patient lying in the bed with no acute distress.  EYES: Pupils equal, round, reactive to light and accommodation. No scleral icterus. Extraocular muscles intact.  HEENT: Head atraumatic, normocephalic. Oropharynx and nasopharynx clear.  NECK:  Supple, no jugular venous distention. No thyroid enlargement, no tenderness.  LUNGS: Normal breath sounds bilaterally, no wheezing, rales,rhonchi or crepitation. No use of accessory muscles of respiration. Decreased bibasilar breath sounds CARDIOVASCULAR: S1, S2 normal. No murmurs, rubs, or gallops.  ABDOMEN: Soft, obese, nontender, nondistended. Bowel sounds present. No organomegaly or mass.  EXTREMITIES: No pedal edema, cyanosis, or clubbing.  NEUROLOGIC: Cranial nerves II through XII are intact. Muscle strength 5/5 in all extremities. Sensation intact. Gait not checked.  PSYCHIATRIC: The patient is alert and oriented x 3. FLAT AFFECT SKIN: No obvious rash, lesion, or ulcer.   LABORATORY PANEL:   CBC Recent Labs  Lab 11/20/2017 0756  WBC 4.3  HGB 11.7*  HCT 32.9*  PLT 124*   ------------------------------------------------------------------------------------------------------------------  Chemistries  Recent Labs  Lab 20-Nov-2017 0756  NA 131*  K 2.9*  CL 99*  CO2 22  GLUCOSE 126*  BUN 16  CREATININE 1.75*  CALCIUM 8.1*  AST 95*  ALT 23  ALKPHOS 88  BILITOT 6.6*   ------------------------------------------------------------------------------------------------------------------  Cardiac Enzymes No results for input(s): TROPONINI in the last 168  hours. ------------------------------------------------------------------------------------------------------------------  RADIOLOGY:  US Pelvis Transvanginal Non-ob (tv Only)  Result Date: November 20, 2017 CLINICAL DATA:  Vaginal bleeding for several months EXAM: TRANSABDOMINAL AND TRANSVAGINAL ULTRASOUND OF PELVIS TECHNIQUE: Both transabdominal and transvaginal ultrasound examinations of the pelvis were performed. Transabdominal technique was performed for global imaging of the pelvis including uterus, ovaries, adnexal regions, and pelvic cul-de-sac. It was necessary to proceed with endovaginal exam following the transabdominal exam to visualize the ovaries. COMPARISON:  09/22/2012 CT of the abdomen pelvis. FINDINGS: Uterus Measurements: 7.4 x 4.3 x 5.1 cm. No fibroids or other mass visualized. Nabothian cysts are noted. Endometrium Thickness: 2.8 mm.  IUD is noted in place. Right ovary Measurements: 4.0 x 2.6 x 3.4 cm. Normal appearance/no adnexal mass. Left ovary Measurements: 3.0 x 2.3 x 2.1 cm. Normal appearance/no adnexal mass. Other findings Moderate free pelvic fluid is noted. This may be physiologic in nature. IMPRESSION: IUD in place. Moderate free pelvic fluid. This may be physiologic in nature. Possibility of recent ovarian cyst rupture could not be totally excluded. No findings of ovarian cysts are seen. Electronically Signed   By: Alcide Clever M.D.   On: 11-20-17 10:10   US Pelvis Complete  Result Date: 20-Nov-2017 CLINICAL DATA:  Vaginal bleeding for several months EXAM: TRANSABDOMINAL AND TRANSVAGINAL ULTRASOUND OF PELVIS TECHNIQUE: Both transabdominal and transvaginal ultrasound examinations of the pelvis were performed. Transabdominal technique  was performed for global imaging of the pelvis including uterus, ovaries, adnexal regions, and pelvic cul-de-sac. It was necessary to proceed with endovaginal exam following the transabdominal exam to visualize the ovaries. COMPARISON:  09/22/2012 CT  of the abdomen pelvis. FINDINGS: Uterus Measurements: 7.4 x 4.3 x 5.1 cm. No fibroids or other mass visualized. Nabothian cysts are noted. Endometrium Thickness: 2.8 mm.  IUD is noted in place. Right ovary Measurements: 4.0 x 2.6 x 3.4 cm. Normal appearance/no adnexal mass. Left ovary Measurements: 3.0 x 2.3 x 2.1 cm. Normal appearance/no adnexal mass. Other findings Moderate free pelvic fluid is noted. This may be physiologic in nature. IMPRESSION: IUD in place. Moderate free pelvic fluid. This may be physiologic in nature. Possibility of recent ovarian cyst rupture could not be totally excluded. No findings of ovarian cysts are seen. Electronically Signed   By: Alcide Clever M.D.   On: 11-25-17 10:10    EKG:   Orders placed or performed during the hospital encounter of 25-Nov-2017  . ED EKG  . ED EKG    IMPRESSION AND PLAN:   Shelma Eiben  is a 42 y.o. female with a known history of anxiety and depression, status post laparoscopic gastric sleeve resection, dysfunctional uterine bleeding presents to hospital secondary to a fall.  1.  Acute renal failure-likely prerenal, however her BUN is not significantly increased. -Gentle IV fluids and monitor.  If needed will order renal ultrasound -Hold metformin and other nephrotoxins  2.  Hypokalemia-secondary to poor oral intake.  Being replaced  3.  Dysfunctional uterine bleeding-going on for a few months, had a left ovarian cyst based on pelvic ultrasound 2 months ago. -Missed recent follow-up visits and increased bleeding with worsening anemia. -We will get OB/GYN consult inpatient  4.  Depression anxiety-continue home medications.  Has a flat affect  5.  DVT prophylaxis-teds and SCDs.  Encourage ambulation.  Especially due to vaginal bleeding.  6. Bacterial vaginosis- flagyl 500mg  bid for 7 days   All the records are reviewed and case discussed with ED provider. Management plans discussed with the patient, family and they are in  agreement.  CODE STATUS: Full Code  TOTAL TIME TAKING CARE OF THIS PATIENT: 50 minutes.    Enid Baas M.D on 2017-11-25 at 11:39 AM  Between 7am to 6pm - Pager - [phone]  After 6pm go to [email or web address] - password [email or web address]  [facility]  Office  [phone]  CC: Primary care physician; Dortha Kern, MD

## 2017-11-06 NOTE — ED Notes (Signed)
Pt reports her pain is back, but pt is sleepy and is having to be woken up at this time to answer questions.

## 2017-11-06 NOTE — Consult Note (Signed)
Consult History and Physical   SERVICE: Gynecology   Patient Name: Brandy Cooper Patient MRN:   [id number]  CC: vaginal bleeding  HPI: Brandy Cooper is a 42 y.o. with reports of intermittent vaginal bleeding starting in November. She reports intermittent bleeding on a daily basis. She reports feeling dizzy and having syncopal episodes along with bleeding. She reports worsening bleeding for the past couple of days, soaking 4 pads this morning. She reports soaking through pads faster than once per hour at times. Per other provider notes, which she did not report to me, she has also had episodes of pelvic pain that have caused her to "double over." She reports that she was seen at [facility] for these same concerns as well as by Dr. Quillian Quince in [place]. During our interview, she was a poor historian, with a very flat affect, and difficult to get details from. Please see ED Provider and H&P notes for further details.   Nursing reports no vaginal bleeding since the patient has been in the hospital.   Review of Systems: positives in bold GEN:   fevers, chills, weight changes, appetite changes, fatigue, night sweats CV:   CP, palpitations PULM:  SOB, cough GI:  abd pain, N/V/D/C GU:  dysuria, urgency, frequency, vaginal bleeding MSK:  arthralgias, myalgias, back pain, swelling SKIN:  rashes, color changes, pallor NEURO:  numbness, weakness, tingling, seizures, dizziness, tremors, syncope PSYCH:  depression, anxiety, confusion    Past Obstetrical History: OB History   None     Past Gynecologic History: Past diagnosis of PCOS, Kyleena IUD placed Summer 2017 for contraception  Past Medical History: Past Medical History:  Diagnosis Date  . Anxiety   . Bipolar affect, depressed (HCC)   . Depression     Past Surgical History:   Past Surgical History:  Procedure Laterality Date  . CHOLECYSTECTOMY    . LAPAROSCOPIC GASTRIC SLEEVE RESECTION    . TONSILLECTOMY      Family History:   Family history is unknown by patient.  Social History:  Social History   Socioeconomic History  . Marital status: Legally Separated    Spouse name: Not on file  . Number of children: Not on file  . Years of education: Not on file  . Highest education level: Not on file  Occupational History  . Not on file  Social Needs  . Financial resource strain: Not on file  . Food insecurity:    Worry: Not on file    Inability: Not on file  . Transportation needs:    Medical: Not on file    Non-medical: Not on file  Tobacco Use  . Smoking status: Former Smoker    Types: Cigarettes  . Smokeless tobacco: Never Used  . Tobacco comment: quit since 2 months  Substance and Sexual Activity  . Alcohol use: No  . Drug use: No  . Sexual activity: Not on file  Lifestyle  . Physical activity:    Days per week: Not on file    Minutes per session: Not on file  . Stress: Not on file  Relationships  . Social connections:    Talks on phone: Not on file    Gets together: Not on file    Attends religious service: Not on file    Active member of club or organization: Not on file    Attends meetings of clubs or organizations: Not on file    Relationship status: Not on file  . Intimate partner violence:  Fear of current or ex partner: Not on file    Emotionally abused: Not on file    Physically abused: Not on file    Forced sexual activity: Not on file  Other Topics Concern  . Not on file  Social History Narrative   Lives by herself, unsteady gait, ambulates without any assisted device    Home Medications:  Medications reconciled in EPIC  No current facility-administered medications on file prior to encounter.    Current Outpatient Medications on File Prior to Encounter  Medication Sig Dispense Refill  . ALPRAZolam (XANAX) 1 MG tablet Take 2 mg by mouth at bedtime.     Marland Kitchen. buPROPion (WELLBUTRIN XL) 300 MG 24 hr tablet Take 300 mg by mouth daily.    . metFORMIN (GLUCOPHAGE) 500 MG tablet  Take 1,500 mg by mouth daily.     . valACYclovir (VALTREX) 500 MG tablet Take 500 mg by mouth once. As needed    . cephALEXin (KEFLEX) 500 MG capsule Take 1 capsule (500 mg total) by mouth every 12 (twelve) hours. (Patient not taking: Reported on 01/22/2018) 10 capsule 0  . diazepam (VALIUM) 5 MG tablet Take 1 tablet (5 mg total) by mouth every 8 (eight) hours as needed for muscle spasms. 20 tablet 0    Allergies:  Allergies  Allergen Reactions  . Celebrex [Celecoxib] Hives  . Morphine And Related Itching    Physical Exam:  Temp:  [97.6 F (36.4 C)-98.1 F (36.7 C)] 97.6 F (36.4 C) (04/12 1426) Pulse Rate:  [87-102] 87 (04/12 1426) Resp:  [15-21] 18 (04/12 1426) BP: (82-102)/(33-52) 96/51 (04/12 1426) SpO2:  [100 %] 100 % (04/12 1426) Weight:  [113.4 kg (250 lb)-116 kg (255 lb 11.7 oz)] 116 kg (255 lb 11.7 oz) (04/12 1426)   General Appearance:  Well developed, overweight, no acute distress, alert and oriented, and appears stated age Cardiovascular:  Normal S1/S2, regular rate and rhythm, no murmurs, 2+ distal pulses Pulmonary:  clear to auscultation, no wheezes, rales or rhonchi, symmetric air entry, good air exchange Abdomen:  Bowel sounds present, soft, mild b/l UQ tenderness, nondistended, no abnormal masses or organomegaly, no epigastric pain Back: inspection of back is normal Extremities:  extremities normal, no tenderness, atraumatic, no cyanosis or edema Skin:  normal coloration and turgor, no rashes, no suspicious skin lesions noted  Psychiatric:  Flat affect, appropriate, no AH/VH Pelvic:  Deferred  Labs/Studies:  CBC and Coags:  Lab Results  Component Value Date   WBC 4.3 006/28/2019   NEUTOPHILPCT 47 04/04/2015   EOSPCT 3 04/04/2015   BASOPCT 1 04/04/2015   LYMPHOPCT 42 04/04/2015   HGB 11.7 (L) 006/28/2019   HCT 32.9 (L) 006/28/2019   MCV 102.9 (H) 006/28/2019   PLT 124 (L) 006/28/2019   CMP:  Lab Results  Component Value Date   NA 131 (L) 006/28/2019   K  2.9 (L) 006/28/2019   CL 99 (L) 006/28/2019   CO2 22 006/28/2019   BUN 16 006/28/2019   CREATININE 1.75 (H) 006/28/2019   CREATININE 0.68 05/20/2017   CREATININE 0.72 04/04/2015   PROT 6.6 006/28/2019   BILITOT 6.6 (H) 006/28/2019   ALT 23 006/28/2019   AST 95 (H) 006/28/2019   ALKPHOS 88 006/28/2019   TVUS:  [place] Pelvis Complete  Result Date: 01/22/2018 CLINICAL DATA:  Vaginal bleeding for several months EXAM: TRANSABDOMINAL AND TRANSVAGINAL ULTRASOUND OF PELVIS TECHNIQUE: Both transabdominal and transvaginal ultrasound examinations of the pelvis were performed. Transabdominal technique was performed for global  imaging of the pelvis including uterus, ovaries, adnexal regions, and pelvic cul-de-sac. It was necessary to proceed with endovaginal exam following the transabdominal exam to visualize the ovaries. COMPARISON:  09/22/2012 CT of the abdomen pelvis. FINDINGS: Uterus Measurements: 7.4 x 4.3 x 5.1 cm. No fibroids or other mass visualized. Nabothian cysts are noted. Endometrium Thickness: 2.8 mm.  IUD is noted in place. Right ovary Measurements: 4.0 x 2.6 x 3.4 cm. Normal appearance/no adnexal mass. Left ovary Measurements: 3.0 x 2.3 x 2.1 cm. Normal appearance/no adnexal mass. Other findings Moderate free pelvic fluid is noted. This may be physiologic in nature. IMPRESSION: IUD in place. Moderate free pelvic fluid. This may be physiologic in nature. Possibility of recent ovarian cyst rupture could not be totally excluded. No findings of ovarian cysts are seen. Electronically Signed   By: Brandy Cooper M.D.   On: 11/03/2017 10:10   Dg Hip Unilat  With Pelvis 2-3 Views Right  Result Date: 10/20/2017 CLINICAL DATA:  Fall, hip pain EXAM: DG HIP (WITH OR WITHOUT PELVIS) 2-3V RIGHT COMPARISON:  None. FINDINGS: No fracture or dislocation is seen. Bilateral hip joint spaces are preserved. Visualized bony pelvis appears intact. Degenerative changes at L4-5. IUD overlying the pelvis. IMPRESSION: Negative.  Electronically Signed   By: Charline Bills M.D.   On: 10/20/2017 09:14    Assessment / Plan:   NASTEHO GLANTZ is a 42 y.o.  who presents with vaginal bleeding.   1. TVUS within normal limits. IUD in proper position. No evidence of structural abnormality, thickened endometrial stripe, or any other cause for increased or abnormal uterine bleeding.  2. No bleeding since she has been admitted.  3. No lower abdominal/pelvic tenderness on exam. Pelvic exam deferred due to resolution of bleeding and normal ultrasound.  4. I recommend outpatient follow-up with the gynecologist of the patient's choice. Reviewed my recommendations with the patient, who verbalized understanding. All questions answered to patient satisfaction.   Thank you for the opportunity to be involved with this patient's care.   Brandy Cooper 11/03/2017 9:42 PM  ----- Brandy Cooper, CNM Midwife [facility], Department of OB/GYN [facility]

## 2017-11-06 NOTE — ED Notes (Signed)
Per pt, tramadol is ineffective for her. Admitting MD aware. This RN asked pt was is effective for pain control. Pt replied that she has taken percocet before with success.

## 2017-11-06 NOTE — ED Notes (Signed)
Attempted to call report x 1  

## 2017-11-06 NOTE — Progress Notes (Signed)
Pt is refusing IV fluids at this time. Dr. Nemiah Commanderkalisetti made aware. Per MD educate pt on eating and drinking plenty and try to restart fluids in a few hours if pt allows. Pt is now refuses bed alarm also. Educated pt on safety and continues to refuse.

## 2017-11-06 NOTE — Progress Notes (Signed)
Chaplain received a OR for AD from ED. Pt had been moved to 2C. Pt did not want AD. Prayer was offerd but rejected.    11/05/2017 1400  Clinical Encounter Type  Visited With Patient  Visit Type Initial  Referral From Nurse  Spiritual Encounters  Spiritual Needs Brochure

## 2017-11-06 NOTE — ED Notes (Signed)
Here via EMS with c/o vaginal bleeding for 2 days now, hypotensive at 90/40, IV initiated by EMS, 100cc NS already infused. Pt appears pale, NAD. GYN and primary Dr aware of bleeding issues. Soaked through 4 pads this am already. Intermittent bleeding since Nov.

## 2017-11-06 NOTE — ED Notes (Signed)
Pt c./o vaginal bleeding for the past 3 months, states it stopped and now having it for the past 2 days. No bleeding noted with pelvic exam performed by EDP.Marland Kitchen. Some redness of the skin folds of the thighs noted..Marland Kitchen

## 2017-11-06 NOTE — ED Provider Notes (Signed)
[facility] Emergency Department Provider Note  ____________________________________________  Time seen: Approximately 9:20 AM  I have reviewed the triage vital signs and the nursing notes.   HISTORY  Chief Complaint Vaginal Bleeding  Level 5 caveat:  Portions of the history and physical were unable to be obtained due to poor historian, flat affect   HPI Brandy Cooper is a 42 y.o. female with a history of bipolar disorder, polysubstance abuse, and anxiety who presents for evaluation of vaginal bleeding.  Patient reports that she has had vaginal bleeding since November.  She is bleeding every day.  She reports that she changes her pads twice every hour.  Patient reports that she has been feeling dizzy.  She reports having a syncopal episode 2 days ago.  She denies chest pain or shortness of breath.  She reports that she did hit her head but denies any headaches.  She is also complaining of burning sensation in her vagina that has been present for 3 days constant and nonradiating.  She is not currently sexually active.  She denies any prior history of STDs.  Patient reports that she has seen OB/GYN at [facility] for these complaints however I do not see any visit for this patient on epic.  Patient has a very flat affect and is very slow to answer questions.  Past Medical History:  Diagnosis Date  . Anxiety   . Bipolar affect, depressed (HCC)   . Depression     Patient Active Problem List   Diagnosis Date Noted  . ARF (acute renal failure) (HCC) 11/14/2017  . Substance induced mood disorder (HCC) 04/04/2015  . Alcohol abuse 04/04/2015  . Benzodiazepine overdose 04/04/2015    Past Surgical History:  Procedure Laterality Date  . CHOLECYSTECTOMY    . LAPAROSCOPIC GASTRIC SLEEVE RESECTION    . TONSILLECTOMY      Prior to Admission medications   Medication Sig Start Date End Date Taking? Authorizing Provider  ALPRAZolam Prudy Feeler) 1 MG tablet Take 2 mg by  mouth at bedtime.    Yes [provider]  buPROPion (WELLBUTRIN XL) 300 MG 24 hr tablet Take 300 mg by mouth daily.   Yes [provider]  metFORMIN (GLUCOPHAGE) 500 MG tablet Take 1,500 mg by mouth daily.    Yes [provider]  valACYclovir (VALTREX) 500 MG tablet Take 500 mg by mouth once. As needed   Yes [provider]  cephALEXin (KEFLEX) 500 MG capsule Take 1 capsule (500 mg total) by mouth every 12 (twelve) hours. Patient not taking: Reported on Nov 14, 2017 04/04/15   Minna Antis, MD  diazepam (VALIUM) 5 MG tablet Take 1 tablet (5 mg total) by mouth every 8 (eight) hours as needed for muscle spasms. 10/20/17   Emily Filbert, MD    Allergies Celebrex [celecoxib] and Morphine and related  Family History  Family history unknown: Yes    Social History Social History   Tobacco Use  . Smoking status: Former Smoker    Types: Cigarettes  . Smokeless tobacco: Never Used  . Tobacco comment: quit since 2 months  Substance Use Topics  . Alcohol use: No  . Drug use: No    Review of Systems  Constitutional: Negative for fever. + syncope Eyes: Negative for visual changes. ENT: Negative for sore throat. Neck: No neck pain  Cardiovascular: Negative for chest pain. Respiratory: Negative for shortness of breath. Gastrointestinal: Negative for abdominal pain, vomiting or diarrhea. Genitourinary: Negative for dysuria. + vaginal bleeding  and burning pain in the vagina Musculoskeletal: Negative for back pain. Skin: Negative for rash. Neurological: Negative for headaches, weakness or numbness. Psych: No SI or HI  ____________________________________________   PHYSICAL EXAM:  VITAL SIGNS: ED Triage Vitals  Enc Vitals Group     BP 12-03-17 0750 (!) 94/43     Pulse Rate 2017-12-03 0750 (!) 102     Resp 12-03-17 0750 18     Temp Dec 03, 2017 0750 98.1 F (36.7 C)     Temp Source Dec 03, 2017 0750 Oral     SpO2 12-03-17 0750 100 %     Weight  12/03/2017 0751 250 lb (113.4 kg)     Height Dec 03, 2017 0751 5\' 6"  (1.676 m)     Head Circumference --      Peak Flow --      Pain Score Dec 03, 2017 0750 6     Pain Loc --      Pain Edu? --      Excl. in GC? --     Constitutional: Alert and oriented, flat affect, patient will hardly make eye contact, slow to answer questions, no distress.  HEENT:      Head: Normocephalic and atraumatic.         Eyes: Conjunctivae are normal. Sclera is non-icteric.       Mouth/Throat: Mucous membranes are moist.       Neck: Supple with no signs of meningismus. Cardiovascular: Tachycardic with regular rhythm. No murmurs, gallops, or rubs. 2+ symmetrical distal pulses are present in all extremities. No JVD. Respiratory: Normal respiratory effort. Lungs are clear to auscultation bilaterally. No wheezes, crackles, or rhonchi.  Gastrointestinal: Soft, non tender, and non distended with positive bowel sounds. No rebound or guarding. Pelvic exam: Normal external genitalia, no rashes or lesions. Moderate white discharge, no blood in the vaginal vault. Os closed. No cervical motion tenderness.  No uterine or adnexal tenderness.   Musculoskeletal: Nontender with normal range of motion in all extremities. No edema, cyanosis, or erythema of extremities. Neurologic: Normal speech and language. Face is symmetric. Moving all extremities. No gross focal neurologic deficits are appreciated. Skin: Skin is warm, dry and intact. No rash noted. Psychiatric: Mood and affect are normal. Speech and behavior are normal.  ____________________________________________   LABS (all labs ordered are listed, but only abnormal results are displayed)  Labs Reviewed  WET PREP, GENITAL - Abnormal; Notable for the following components:      Result Value   Clue Cells Wet Prep HPF POC PRESENT (*)    WBC, Wet Prep HPF POC MODERATE (*)    All other components within normal limits  COMPREHENSIVE METABOLIC PANEL - Abnormal; Notable for the  following components:   Sodium 131 (*)    Potassium 2.9 (*)    Chloride 99 (*)    Glucose, Bld 126 (*)    Creatinine, Ser 1.75 (*)    Calcium 8.1 (*)    Albumin 2.5 (*)    AST 95 (*)    Total Bilirubin 6.6 (*)    GFR calc non Af Amer 35 (*)    GFR calc Af Amer 41 (*)    All other components within normal limits  CBC - Abnormal; Notable for the following components:   RBC 3.20 (*)    Hemoglobin 11.7 (*)    HCT 32.9 (*)    MCV 102.9 (*)    MCH 36.5 (*)    RDW 25.8 (*)    Platelets 124 (*)    All other  components within normal limits  URINALYSIS, COMPLETE (UACMP) WITH MICROSCOPIC - Abnormal; Notable for the following components:   Color, Urine AMBER (*)    APPearance CLOUDY (*)    Bilirubin Urine SMALL (*)    Squamous Epithelial / LPF 0-5 (*)    All other components within normal limits  RETICULOCYTES - Abnormal; Notable for the following components:   RBC. 3.20 (*)    All other components within normal limits  CHLAMYDIA/NGC RT PCR ([facility] ONLY)  HCG, QUANTITATIVE, PREGNANCY  VITAMIN B12  FOLATE  IRON AND TIBC  FERRITIN  POCT PREGNANCY, URINE   ____________________________________________  EKG  ED ECG REPORT I, Nita Sicklearolina Jeny Nield, the attending physician, personally viewed and interpreted this ECG.  Normal sinus rhythm, rate of 82, normal intervals, normal axis, low voltage QRS, diffuse T wave flattening, no ST elevations or depressions. ____________________________________________  RADIOLOGY  I have personally reviewed the images performed during this visit and I agree with the Radiologist's read.   Interpretation by Radiologist:  [place] Pelvis Transvanginal Non-ob (tv Only)  Result Date: Apr 07, 2018 CLINICAL DATA:  Vaginal bleeding for several months EXAM: TRANSABDOMINAL AND TRANSVAGINAL ULTRASOUND OF PELVIS TECHNIQUE: Both transabdominal and transvaginal ultrasound examinations of the pelvis were performed. Transabdominal technique was performed for global  imaging of the pelvis including uterus, ovaries, adnexal regions, and pelvic cul-de-sac. It was necessary to proceed with endovaginal exam following the transabdominal exam to visualize the ovaries. COMPARISON:  09/22/2012 CT of the abdomen pelvis. FINDINGS: Uterus Measurements: 7.4 x 4.3 x 5.1 cm. No fibroids or other mass visualized. Nabothian cysts are noted. Endometrium Thickness: 2.8 mm.  IUD is noted in place. Right ovary Measurements: 4.0 x 2.6 x 3.4 cm. Normal appearance/no adnexal mass. Left ovary Measurements: 3.0 x 2.3 x 2.1 cm. Normal appearance/no adnexal mass. Other findings Moderate free pelvic fluid is noted. This may be physiologic in nature. IMPRESSION: IUD in place. Moderate free pelvic fluid. This may be physiologic in nature. Possibility of recent ovarian cyst rupture could not be totally excluded. No findings of ovarian cysts are seen. Electronically Signed   By: Alcide CleverMark  Lukens M.D.   On: 0Sep 11, 2019 10:10   [place] Pelvis Complete  Result Date: Apr 07, 2018 CLINICAL DATA:  Vaginal bleeding for several months EXAM: TRANSABDOMINAL AND TRANSVAGINAL ULTRASOUND OF PELVIS TECHNIQUE: Both transabdominal and transvaginal ultrasound examinations of the pelvis were performed. Transabdominal technique was performed for global imaging of the pelvis including uterus, ovaries, adnexal regions, and pelvic cul-de-sac. It was necessary to proceed with endovaginal exam following the transabdominal exam to visualize the ovaries. COMPARISON:  09/22/2012 CT of the abdomen pelvis. FINDINGS: Uterus Measurements: 7.4 x 4.3 x 5.1 cm. No fibroids or other mass visualized. Nabothian cysts are noted. Endometrium Thickness: 2.8 mm.  IUD is noted in place. Right ovary Measurements: 4.0 x 2.6 x 3.4 cm. Normal appearance/no adnexal mass. Left ovary Measurements: 3.0 x 2.3 x 2.1 cm. Normal appearance/no adnexal mass. Other findings Moderate free pelvic fluid is noted. This may be physiologic in nature. IMPRESSION: IUD in place.  Moderate free pelvic fluid. This may be physiologic in nature. Possibility of recent ovarian cyst rupture could not be totally excluded. No findings of ovarian cysts are seen. Electronically Signed   By: Alcide CleverMark  Lukens M.D.   On: 0Sep 11, 2019 10:10     ____________________________________________   PROCEDURES  Procedure(s) performed: None Procedures Critical Care performed:  None ____________________________________________   INITIAL IMPRESSION / ASSESSMENT AND PLAN / ED COURSE   42 y.o. female with a history  of bipolar disorder, polysubstance abuse, and anxiety who presents for evaluation of vaginal bleeding for 6 months, 3 days of burning in her vagina and a syncopal episode 2 days ago.  Patient has a very flat affect, poor eye contact, slow to answer questions, and sometimes contradicts herself.  Reports that she has been seen for this issue at [facility] however I do not see any notes from OB/GYN on epic for this patient.  She has no evidence of vaginal bleeding on pelvic exam.  She is tachycardic and slightly hypotensive.  Her abdomen is soft.  She has no CMT or adnexal tenderness.  She does have moderate amount of discharge which was sent for wet prep and GC chlamydia.  Pregnancy test is negative.  Urinalysis with no evidence of dehydration or infection. CMP showing acute kidney injury with a creatinine of 1.75. Hgb is 11.7 (15.4 in 04/2017). IVF ordered. TVUS ordered.  Plan for admission for AKI and syncope. No evidence of active vaginal bleeding today. Patient will need Obgyn eval.    _________________________ 11:11 AM on 10/30/2017 -----------------------------------------  [place] with no findings that could justify it abnormal uterine bleeding.  They did see moderate free fluid in the pelvis possibly from a ovarian cyst rupture although no cyst is visualized on this exam.  Patient has no abdominal tenderness on exam.  Will admit to the hospitalist for IV fluids.   As part of my medical  decision making, I reviewed the following data within the electronic MEDICAL RECORD NUMBER Nursing notes reviewed and incorporated, Labs reviewed , EKG interpreted , Radiograph reviewed , Discussed with admitting physician , Notes from prior ED visits and [place] Controlled Substance Database    Pertinent labs & imaging results that were available during my care of the patient were reviewed by me and considered in my medical decision making (see chart for details).    ____________________________________________   FINAL CLINICAL IMPRESSION(S) / ED DIAGNOSES  Final diagnoses:  Vaginal bleeding  AKI (acute kidney injury) (HCC)  Syncope, unspecified syncope type  BV (bacterial vaginosis)      NEW MEDICATIONS STARTED DURING THIS VISIT:  ED Discharge Orders    None       Note:  This document was prepared using Dragon voice recognition software and may include unintentional dictation errors.    Don Perking, [place], MD 11/05/2017 1351

## 2017-11-07 ENCOUNTER — Inpatient Hospital Stay: Payer: BLUE CROSS/BLUE SHIELD

## 2017-11-07 LAB — BASIC METABOLIC PANEL
ANION GAP: 8 (ref 5–15)
BUN: 17 mg/dL (ref 6–20)
CALCIUM: 8.3 mg/dL — AB (ref 8.9–10.3)
CHLORIDE: 99 mmol/L — AB (ref 101–111)
CO2: 24 mmol/L (ref 22–32)
CREATININE: 1.47 mg/dL — AB (ref 0.44–1.00)
GFR calc non Af Amer: 43 mL/min — ABNORMAL LOW (ref >60)
GFR, EST AFRICAN AMERICAN: 50 mL/min — AB (ref >60)
Glucose, Bld: 95 mg/dL (ref 65–99)
Potassium: 2.8 mmol/L — ABNORMAL LOW (ref 3.5–5.1)
Sodium: 131 mmol/L — ABNORMAL LOW (ref 135–145)

## 2017-11-07 LAB — CBC
HCT: 35.7 % (ref 35.0–47.0)
HEMOGLOBIN: 12.6 g/dL (ref 12.0–16.0)
MCH: 36.9 pg — AB (ref 26.0–34.0)
MCHC: 35.2 g/dL (ref 32.0–36.0)
MCV: 104.9 fL — ABNORMAL HIGH (ref 80.0–100.0)
PLATELETS: 115 10*3/uL — AB (ref 150–440)
RBC: 3.4 MIL/uL — AB (ref 3.80–5.20)
RDW: 25.8 % — ABNORMAL HIGH (ref 11.5–14.5)
WBC: 3.8 10*3/uL (ref 3.6–11.0)

## 2017-11-07 MED ORDER — FOLIC ACID 1 MG PO TABS
1.0000 mg | ORAL_TABLET | Freq: Every day | ORAL | Status: DC
  Administered 2017-11-07 – 2017-11-21 (×15): 1 mg via ORAL
  Filled 2017-11-07 (×15): qty 1

## 2017-11-07 MED ORDER — ALUM & MAG HYDROXIDE-SIMETH 200-200-20 MG/5ML PO SUSP
30.0000 mL | Freq: Four times a day (QID) | ORAL | Status: DC | PRN
  Administered 2017-11-07 – 2017-11-08 (×3): 30 mL via ORAL
  Filled 2017-11-07 (×4): qty 30

## 2017-11-07 MED ORDER — SODIUM CHLORIDE 0.9 % IV SOLN: 250.0000 mL | INTRAVENOUS | Status: DC | PRN

## 2017-11-07 MED ORDER — POTASSIUM CHLORIDE CRYS ER 20 MEQ PO TBCR: 40.0000 meq | EXTENDED_RELEASE_TABLET | ORAL | Status: DC

## 2017-11-07 MED ORDER — BISACODYL 10 MG RE SUPP: 10.0000 mg | Freq: Every day | RECTAL | Status: DC | PRN

## 2017-11-07 MED ORDER — SODIUM CHLORIDE 0.9% FLUSH: 3.0000 mL | INTRAVENOUS | Status: DC | PRN

## 2017-11-07 MED ORDER — POTASSIUM CHLORIDE 20 MEQ PO PACK
40.0000 meq | PACK | ORAL | Status: AC
  Administered 2017-11-07 (×2): 40 meq via ORAL
  Filled 2017-11-07 (×2): qty 2

## 2017-11-07 MED ORDER — BISACODYL 10 MG RE SUPP
RECTAL | Status: AC
  Administered 2017-11-07: 10 mg
  Filled 2017-11-07: qty 1

## 2017-11-07 MED ORDER — SODIUM CHLORIDE 0.9% FLUSH
3.0000 mL | Freq: Two times a day (BID) | INTRAVENOUS | Status: DC
  Administered 2017-11-07 – 2017-11-21 (×22): 3 mL via INTRAVENOUS

## 2017-11-07 MED ORDER — POTASSIUM CHLORIDE CRYS ER 20 MEQ PO TBCR
40.0000 meq | EXTENDED_RELEASE_TABLET | ORAL | Status: DC
  Administered 2017-11-07: 40 meq via ORAL
  Filled 2017-11-07: qty 2

## 2017-11-07 NOTE — Progress Notes (Signed)
PT Cancellation Note  Patient Details Name: PAITON BOULTINGHOUSE MRN: [id number] DOB: 27-Oct-1975   Cancelled Treatment:    Reason Eval/Treat Not Completed: Other (comment).  Pt is walking fine per her report and declines PT intervention.  Will ck tomorrow if pt still here to see if she is still confident about mobility.   Ivar Drape 11/07/2017, 4:41 PM   Samul Dada, PT MS Acute Rehab Dept. Number: [facility] [id number] and [facility] [phone]

## 2017-11-07 NOTE — Clinical Social Work Note (Addendum)
CSW received consult for possible needs for transportation in the community. CSW will assess when able.  UPDATE: [facility] should be for imminent transportation for discharge. The patient has supports listed in her demographics. Should the attending RN be unable to reach anyone, then the CSW will become involved. The CSW is signing off. Please consult should needs arise.  Brandy PonderKaren Martha Tayonna Cooper, MSW, Theresia MajorsLCSWA [phone]

## 2017-11-07 NOTE — Care Management (Signed)
RNCM stopped by RN stating that patient does not have transportation to home today.She claimed that she was "told by case manager that we would assist her".  I spoke with CSW and she has  community resources for patient however she has not talked with patient.  I explained to patient that she would need to make arrangements for transportation. She believes her neighbor could help but does not have her contact.

## 2017-11-07 NOTE — Progress Notes (Signed)
Walked in hall to Nurses station independently.   NPO now for Abd US due in  Few hours.

## 2017-11-07 NOTE — Progress Notes (Signed)
Lab stuck pt twice, unable to draw blood, another girl from lab came to try, but pt refused to allow her try "I am not going to let you do me like a pin cushion." Text sent to Dr Nemiah CommanderKalisetti to make her aware.

## 2017-11-07 NOTE — Progress Notes (Signed)
Pt says she doesn't have  A ride today to take her home, just spoke w/Jadira [place], CM and she said she would speak w/pt.

## 2017-11-07 NOTE — Progress Notes (Signed)
[employer] - [place] [facility]   PATIENT NAME: Brandy Cooper    MR#:  [id number]  DATE OF BIRTH:  02/22/1976  SUBJECTIVE:  CHIEF COMPLAINT:   Chief Complaint  Patient presents with  . Vaginal Bleeding   -Complaints of  tiredness.  Did not sleep last night due to low back pain. -Potassium is still low  REVIEW OF SYSTEMS:  Review of Systems  Constitutional: Positive for malaise/fatigue. Negative for chills and fever.  HENT: Negative for congestion, ear discharge, hearing loss and nosebleeds.   Eyes: Negative for blurred vision and double vision.  Respiratory: Negative for cough, shortness of breath and wheezing.   Cardiovascular: Negative for chest pain and palpitations.  Gastrointestinal: Negative for abdominal pain, constipation, diarrhea, nausea and vomiting.  Genitourinary: Negative for dysuria.  Musculoskeletal: Positive for back pain and myalgias.  Neurological: Positive for weakness. Negative for dizziness, speech change, focal weakness, seizures and headaches.    DRUG ALLERGIES:   Allergies  Allergen Reactions  . Celebrex [Celecoxib] Hives  . Morphine And Related Itching    VITALS:  Blood pressure (!) 91/54, pulse 84, temperature 98.3 F (36.8 C), resp. rate 18, height 5\' 6"  (1.676 m), weight 116 kg (255 lb 11.7 oz), SpO2 97 %.  PHYSICAL EXAMINATION:  Physical Exam  GENERAL:  42 y.o.-year-old patient lying in the bed with no acute distress.  EYES: Pupils equal, round, reactive to light and accommodation. No scleral icterus. Extraocular muscles intact. Icteric sclerae HEENT: Head atraumatic, normocephalic. Oropharynx and nasopharynx clear.  NECK:  Supple, no jugular venous distention. No thyroid enlargement, no tenderness.  LUNGS: Normal breath sounds bilaterally, no wheezing, rales,rhonchi or crepitation. No use of accessory muscles of respiration. Decreased bibasilar breath sounds CARDIOVASCULAR: S1, S2 normal. No murmurs, rubs, or  gallops.  ABDOMEN: Soft, obese, nontender, nondistended. Bowel sounds present. No organomegaly or mass.  EXTREMITIES: No pedal edema, cyanosis, or clubbing.  NEUROLOGIC: Cranial nerves II through XII are intact. Muscle strength 5/5 in all extremities. Sensation intact. Gait not checked.  PSYCHIATRIC: The patient is alert and oriented x 3. SKIN: No obvious rash, lesion, or ulcer.    LABORATORY PANEL:   CBC Recent Labs  Lab 11/07/17 0526  WBC 3.8  HGB 12.6  HCT 35.7  PLT 115*   ------------------------------------------------------------------------------------------------------------------  Chemistries  Recent Labs  Lab 12-01-17 0756 11/07/17 0526  NA 131* 131*  K 2.9* 2.8*  CL 99* 99*  CO2 22 24  GLUCOSE 126* 95  BUN 16 17  CREATININE 1.75* 1.47*  CALCIUM 8.1* 8.3*  AST 95*  --   ALT 23  --   ALKPHOS 88  --   BILITOT 6.6*  --    ------------------------------------------------------------------------------------------------------------------  Cardiac Enzymes No results for input(s): TROPONINI in the last 168 hours. ------------------------------------------------------------------------------------------------------------------  RADIOLOGY:  US Pelvis Transvanginal Non-ob (tv Only)  Result Date: 12-01-17 CLINICAL DATA:  Vaginal bleeding for several months EXAM: TRANSABDOMINAL AND TRANSVAGINAL ULTRASOUND OF PELVIS TECHNIQUE: Both transabdominal and transvaginal ultrasound examinations of the pelvis were performed. Transabdominal technique was performed for global imaging of the pelvis including uterus, ovaries, adnexal regions, and pelvic cul-de-sac. It was necessary to proceed with endovaginal exam following the transabdominal exam to visualize the ovaries. COMPARISON:  09/22/2012 CT of the abdomen pelvis. FINDINGS: Uterus Measurements: 7.4 x 4.3 x 5.1 cm. No fibroids or other mass visualized. Nabothian cysts are noted. Endometrium Thickness: 2.8 mm.  IUD is noted in  place. Right ovary Measurements: 4.0 x 2.6 x 3.4  cm. Normal appearance/no adnexal mass. Left ovary Measurements: 3.0 x 2.3 x 2.1 cm. Normal appearance/no adnexal mass. Other findings Moderate free pelvic fluid is noted. This may be physiologic in nature. IMPRESSION: IUD in place. Moderate free pelvic fluid. This may be physiologic in nature. Possibility of recent ovarian cyst rupture could not be totally excluded. No findings of ovarian cysts are seen. Electronically Signed   By: Alcide CleverMark  Lukens M.D.   On: 07/23/2018 10:10   [place] Pelvis Complete  Result Date: 30-Nov-2017 CLINICAL DATA:  Vaginal bleeding for several months EXAM: TRANSABDOMINAL AND TRANSVAGINAL ULTRASOUND OF PELVIS TECHNIQUE: Both transabdominal and transvaginal ultrasound examinations of the pelvis were performed. Transabdominal technique was performed for global imaging of the pelvis including uterus, ovaries, adnexal regions, and pelvic cul-de-sac. It was necessary to proceed with endovaginal exam following the transabdominal exam to visualize the ovaries. COMPARISON:  09/22/2012 CT of the abdomen pelvis. FINDINGS: Uterus Measurements: 7.4 x 4.3 x 5.1 cm. No fibroids or other mass visualized. Nabothian cysts are noted. Endometrium Thickness: 2.8 mm.  IUD is noted in place. Right ovary Measurements: 4.0 x 2.6 x 3.4 cm. Normal appearance/no adnexal mass. Left ovary Measurements: 3.0 x 2.3 x 2.1 cm. Normal appearance/no adnexal mass. Other findings Moderate free pelvic fluid is noted. This may be physiologic in nature. IMPRESSION: IUD in place. Moderate free pelvic fluid. This may be physiologic in nature. Possibility of recent ovarian cyst rupture could not be totally excluded. No findings of ovarian cysts are seen. Electronically Signed   By: Alcide CleverMark  Lukens M.D.   On: 07/23/2018 10:10    EKG:   Orders placed or performed during the hospital encounter of 26-Sep-2017  . ED EKG  . ED EKG  . EKG 12-Lead  . EKG 12-Lead    ASSESSMENT AND PLAN:    Brandy Cooper  is a 42 y.o. female with a known history of anxiety and depression, status post laparoscopic gastric sleeve resection, dysfunctional uterine bleeding presents to hospital secondary to a fall.  1.  Acute renal failure-likely prerenal, however her BUN is not significantly increased. -on IV fluids and monitor-With minimal improvement -If needed we will get a renal ultrasound -Hold metformin and other nephrotoxins  2.  Hypokalemia-secondary to poor oral intake.  Being replaced  3.  Dysfunctional uterine bleeding-going on for a few months, had a left ovarian cyst based on pelvic ultrasound 2 months ago. -Missed recent follow-up visits and increased bleeding with worsening anemia. -Appreciate OB/GYN consult-they have recommended outpatient follow-up since no active bleeding at this time  4.  Depression anxiety-continue home medications.  Has a flat affect  5. Bacterial vaginosis- flagyl 500mg  bid for 7 days  6.  Weakness-looks like she probably has underlying liver disease.  She denies any alcohol use, AST is elevated. -Also has elevated bilirubin.  Will get a right upper quadrant ultrasound and check ammonia level  7.  Encourage ambulation  8.  DVT prophylaxis-teds and SCDs.  Hold off on heparin products due to vaginal bleeding     All the records are reviewed and case discussed with Care Management/Social Workerr. Management plans discussed with the patient, family and they are in agreement.  CODE STATUS: Full Code  TOTAL TIME TAKING CARE OF THIS PATIENT: 39 minutes.   POSSIBLE D/C IN 1-2 DAYS, DEPENDING ON CLINICAL CONDITION.   Enid BaasKALISETTI,Jalisia Puchalski M.D on 11/07/2017 at 11:52 AM  Between 7am to 6pm - Pager - [phone]  After 6pm go to [email or web address] - password [email or web address]  [facility]  Office  [phone]  CC: Primary care physician; Dortha Kern, MD

## 2017-11-07 NOTE — Progress Notes (Signed)
Pt. Refused Bed alarm. Education given regarding safety precautions but insisted not to have turn on bed alarm on.

## 2017-11-07 NOTE — Progress Notes (Signed)
I told pt that she had d/c home orders for later today, aftr abd US. She says she has no ride home. Just spoke

## 2017-11-07 NOTE — Clinical Social Work Note (Signed)
The CSW followed up with the patient's RN to discuss transportation. The RN stated that the patient's family is not able to transport her. The patient is not discharging today. The CSW advised the attending RN that if the patient discharges tomorrow, the CSW will provide a taxi voucher. Should the patient discharge M-F, the CSW department may be able to assist with a [employer] ticket. CSW will follow-up tomorrow for transportation needs.  [place] PonderKaren Martha Gillian Meeuwsen, MSW, Theresia MajorsLCSWA [phone]

## 2017-11-08 LAB — BASIC METABOLIC PANEL
ANION GAP: 6 (ref 5–15)
BUN: 17 mg/dL (ref 6–20)
CHLORIDE: 102 mmol/L (ref 101–111)
CO2: 23 mmol/L (ref 22–32)
Calcium: 8.4 mg/dL — ABNORMAL LOW (ref 8.9–10.3)
Creatinine, Ser: 1.42 mg/dL — ABNORMAL HIGH (ref 0.44–1.00)
GFR calc Af Amer: 52 mL/min — ABNORMAL LOW (ref >60)
GFR, EST NON AFRICAN AMERICAN: 45 mL/min — AB (ref >60)
Glucose, Bld: 92 mg/dL (ref 65–99)
POTASSIUM: 4.4 mmol/L (ref 3.5–5.1)
SODIUM: 131 mmol/L — AB (ref 135–145)

## 2017-11-08 LAB — HEPATIC FUNCTION PANEL
ALT: 26 U/L (ref 14–54)
AST: 108 U/L — ABNORMAL HIGH (ref 15–41)
Albumin: 2.5 g/dL — ABNORMAL LOW (ref 3.5–5.0)
Alkaline Phosphatase: 97 U/L (ref 38–126)
BILIRUBIN DIRECT: 3.6 mg/dL — AB (ref 0.1–0.5)
BILIRUBIN INDIRECT: 3.8 mg/dL — AB (ref 0.3–0.9)
BILIRUBIN TOTAL: 7.4 mg/dL — AB (ref 0.3–1.2)
Total Protein: 6.9 g/dL (ref 6.5–8.1)

## 2017-11-08 LAB — AMMONIA: Ammonia: 113 umol/L — ABNORMAL HIGH (ref 9–35)

## 2017-11-08 MED ORDER — RIFAXIMIN 550 MG PO TABS
550.0000 mg | ORAL_TABLET | Freq: Two times a day (BID) | ORAL | Status: DC
  Administered 2017-11-08 – 2017-11-21 (×26): 550 mg via ORAL
  Filled 2017-11-08 (×28): qty 1

## 2017-11-08 MED ORDER — LACTULOSE 10 GM/15ML PO SOLN: 20.0000 g | Freq: Three times a day (TID) | ORAL | Status: DC

## 2017-11-08 MED ORDER — ALBUMIN HUMAN 25 % IV SOLN
25.0000 g | Freq: Every day | INTRAVENOUS | Status: AC
  Administered 2017-11-08 – 2017-11-10 (×3): 25 g via INTRAVENOUS
  Filled 2017-11-08 (×3): qty 100

## 2017-11-08 MED ORDER — OXYCODONE-ACETAMINOPHEN 5-325 MG PO TABS
1.0000 | ORAL_TABLET | Freq: Four times a day (QID) | ORAL | Status: DC | PRN
  Administered 2017-11-08 – 2017-11-10 (×5): 1 via ORAL
  Filled 2017-11-08 (×5): qty 1

## 2017-11-08 MED ORDER — LIDOCAINE 5 % EX PTCH
1.0000 | MEDICATED_PATCH | CUTANEOUS | Status: DC
  Administered 2017-11-08 – 2017-11-21 (×14): 1 via TRANSDERMAL
  Filled 2017-11-08 (×15): qty 1

## 2017-11-08 MED ORDER — LACTULOSE 10 GM/15ML PO SOLN
20.0000 g | Freq: Every day | ORAL | Status: DC
  Administered 2017-11-08: 20 g via ORAL
  Filled 2017-11-08: qty 30

## 2017-11-08 MED ORDER — LACTULOSE 10 GM/15ML PO SOLN
20.0000 g | Freq: Three times a day (TID) | ORAL | Status: DC
  Administered 2017-11-08 (×2): 20 g via ORAL
  Filled 2017-11-08 (×2): qty 30

## 2017-11-08 NOTE — Progress Notes (Signed)
PT Cancellation Note  Patient Details Name: Brandy Cooper MRN: [id number] DOB: 1976-01-19   Cancelled Treatment:    Reason Eval/Treat Not Completed: Other (comment). PT entered room and explained role to patient. Patient appeared confused and stated she was waiting for something to be placed. Per RN staff she fell in the ED, likely from history of alcohol abuse, she has been ambulatory on the floors and has refused bed alarms. PT will  re-attempt at a later date/time.   Alva GarnetPatrick Armando Bukhari PT, DPT, CSCS    11/08/2017, 12:11 PM

## 2017-11-08 NOTE — Progress Notes (Signed)
[employer] -  at [facility]   PATIENT NAME: Brandy Cooper    MR#:  [id number]  DATE OF BIRTH:  [birth year]  SUBJECTIVE:  CHIEF COMPLAINT:   Chief Complaint  Patient presents with  . Vaginal Bleeding   -Due to elevated bilirubin and AST, liver ultrasound ordered.  Shows liver cirrhosis.  When confronted, patient says that she has been drinking lots of alcohol almost  fifth every day for 5 years now. -ammonia level ordered and that came up elevated   REVIEW OF SYSTEMS:  Review of Systems  Constitutional: Positive for malaise/fatigue. Negative for chills and fever.  HENT: Negative for congestion, ear discharge, hearing loss and nosebleeds.   Eyes: Negative for blurred vision and double vision.  Respiratory: Negative for cough, shortness of breath and wheezing.   Cardiovascular: Negative for chest pain and palpitations.  Gastrointestinal: Negative for abdominal pain, constipation, diarrhea, nausea and vomiting.  Genitourinary: Negative for dysuria.  Musculoskeletal: Positive for back pain and myalgias.  Neurological: Positive for weakness. Negative for dizziness, speech change, focal weakness, seizures and headaches.    DRUG ALLERGIES:   Allergies  Allergen Reactions  . Celebrex [Celecoxib] Hives  . Morphine And Related Itching    VITALS:  Blood pressure (!) 98/52, pulse (!) 109, temperature 98.2 F (36.8 C), temperature source Oral, resp. rate 18, height 5\' 6"  (1.676 m), weight 116 kg (255 lb 11.7 oz), SpO2 100 %.  PHYSICAL EXAMINATION:  Physical Exam  GENERAL:  42 y.o.-year-old patient lying in the bed with no acute distress.  EYES: Pupils equal, round, reactive to light and accommodation. No scleral icterus. Extraocular muscles intact. Icteric sclerae HEENT: Head atraumatic, normocephalic. Oropharynx and nasopharynx clear.  NECK:  Supple, no jugular venous distention. No thyroid enlargement, no tenderness.  LUNGS: Normal breath sounds  bilaterally, no wheezing, rales,rhonchi or crepitation. No use of accessory muscles of respiration. Decreased bibasilar breath sounds CARDIOVASCULAR: S1, S2 normal. No murmurs, rubs, or gallops.  ABDOMEN: Soft, obese, nontender, nondistended. Bowel sounds present. No organomegaly or mass.  EXTREMITIES: No pedal edema, cyanosis, or clubbing.  NEUROLOGIC: Cranial nerves II through XII are intact. Muscle strength 5/5 in all extremities. Sensation intact. Gait not checked.  Global weakness noted PSYCHIATRIC: The patient is alert and oriented x 2-3.  Sleepy SKIN: No obvious rash, lesion, or ulcer.    LABORATORY PANEL:   CBC Recent Labs  Lab 11/07/17 0526  WBC 3.8  HGB 12.6  HCT 35.7  PLT 115*   ------------------------------------------------------------------------------------------------------------------  Chemistries  Recent Labs  Lab 11/08/17 0517 11/08/17 0742  NA 131*  --   K 4.4  --   CL 102  --   CO2 23  --   GLUCOSE 92  --   BUN 17  --   CREATININE 1.42*  --   CALCIUM 8.4*  --   AST  --  108*  ALT  --  26  ALKPHOS  --  97  BILITOT  --  7.4*   ------------------------------------------------------------------------------------------------------------------  Cardiac Enzymes No results for input(s): TROPONINI in the last 168 hours. ------------------------------------------------------------------------------------------------------------------  RADIOLOGY:  US Abdomen Limited Ruq  Result Date: 11/07/2017 CLINICAL DATA:  Hepatic cirrhosis EXAM: ULTRASOUND ABDOMEN LIMITED RIGHT UPPER QUADRANT COMPARISON:  CT abdomen and pelvis September 22, 2012 FINDINGS: Gallbladder: Surgically absent. Common bile duct: Diameter: 12 mm proximally. Distal common bile duct could not be visualized due to overlying gas. Liver: No focal lesion identified. Liver echogenicity is diffusely increased. Portal vein is patent on  color Doppler imaging with normal direction of blood flow towards the  liver. Other: There is trace ascites. IMPRESSION: 1. Gallbladder absent. Visualized common bile duct is dilated to 12 mm. More distal common bile duct could not be visualized due to overlying gas. This finding may warrant MRCP to further evaluate given the dilatation proximally. 2. Diffuse increase in liver echogenicity, a finding indicative of hepatic steatosis, potentially with underlying parenchymal liver disease. While no focal liver lesions are identified on this study, it must be cautioned that the sensitivity of ultrasound for detection of focal liver lesions is diminished significantly in this circumstance. 3.  Trace ascites. Electronically Signed   By: Bretta BangWilliam  Woodruff III M.D.   On: 11/07/2017 17:09    EKG:   Orders placed or performed during the hospital encounter of 2017-09-09  . ED EKG  . ED EKG  . EKG 12-Lead  . EKG 12-Lead  . EKG    ASSESSMENT AND PLAN:   Aniceto Bossngela Kallenbach  is a 42 y.o. female with a known history of anxiety and depression, status post laparoscopic gastric sleeve resection, dysfunctional uterine bleeding presents to hospital secondary to a fall.  1.  Acute renal failure-likely prerenal, however her BUN is not significantly increased. -Discontinue IV fluids and monitor.  Improved to 1.4 and stable -Hold metformin and other nephrotoxins  2.  Hypokalemia-secondary to poor oral intake.  replaced  3.  Dysfunctional uterine bleeding-going on for a few months, had a left ovarian cyst based on pelvic ultrasound 2 months ago. -Missed recent follow-up visits and increased bleeding with worsening anemia. -Appreciate OB/GYN consult-they have recommended outpatient follow-up since no active bleeding at this time  4.  Depression anxiety-continue home medications.     5. Bacterial vaginosis- flagyl 500mg  bid for 7 days  6.  Altered mental status-likely hepatic encephalopathy. 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 -Started  on lactulose.  Monitor ammonia in a.m.  7.  Encourage ambulation  8.  DVT prophylaxis-teds and SCDs.  Hold off on heparin products due to vaginal bleeding     All the records are reviewed and case discussed with Care Management/Social Workerr. Management plans discussed with the patient, family and they are in agreement.  CODE STATUS: Full Code  TOTAL TIME TAKING CARE OF THIS PATIENT: 42 minutes.   POSSIBLE D/C IN 1-2 DAYS, DEPENDING ON CLINICAL CONDITION.   Enid BaasKALISETTI,Embrie Mikkelsen M.D on 11/08/2017 at 10:55 AM  Between 7am to 6pm - Pager - [phone]  After 6pm go to [email or web address] - password [email or web address] [email or web address]  [facility] [place] [facility]  Office  [phone]  CC: Primary care physician; Dortha KernBliss, Laura K, MD

## 2017-11-09 ENCOUNTER — Inpatient Hospital Stay: Payer: BLUE CROSS/BLUE SHIELD

## 2017-11-09 DIAGNOSIS — R748 Abnormal levels of other serum enzymes: Secondary | ICD-10-CM

## 2017-11-09 DIAGNOSIS — K7031 Alcoholic cirrhosis of liver with ascites: Principal | ICD-10-CM

## 2017-11-09 LAB — BASIC METABOLIC PANEL
Anion gap: 8 (ref 5–15)
BUN: 19 mg/dL (ref 6–20)
CALCIUM: 8.4 mg/dL — AB (ref 8.9–10.3)
CO2: 21 mmol/L — AB (ref 22–32)
CREATININE: 1.7 mg/dL — AB (ref 0.44–1.00)
Chloride: 101 mmol/L (ref 101–111)
GFR calc Af Amer: 42 mL/min — ABNORMAL LOW (ref >60)
GFR calc non Af Amer: 36 mL/min — ABNORMAL LOW (ref >60)
GLUCOSE: 111 mg/dL — AB (ref 65–99)
Potassium: 4.2 mmol/L (ref 3.5–5.1)
Sodium: 130 mmol/L — ABNORMAL LOW (ref 135–145)

## 2017-11-09 LAB — AMMONIA: AMMONIA: 114 umol/L — AB (ref 9–35)

## 2017-11-09 LAB — HIV ANTIBODY (ROUTINE TESTING W REFLEX): HIV SCREEN 4TH GENERATION: NONREACTIVE

## 2017-11-09 LAB — PROTIME-INR
INR: 2.33
PROTHROMBIN TIME: 25.4 s — AB (ref 11.4–15.2)

## 2017-11-09 MED ORDER — LACTULOSE 10 GM/15ML PO SOLN
30.0000 g | Freq: Three times a day (TID) | ORAL | Status: DC
  Administered 2017-11-09 – 2017-11-14 (×16): 30 g via ORAL
  Filled 2017-11-09 (×17): qty 60

## 2017-11-09 NOTE — Progress Notes (Signed)
Patient sent to US.

## 2017-11-09 NOTE — Evaluation (Signed)
Physical Therapy Evaluation Patient Details Name: CYDNEE FUQUAY MRN: [id number] DOB: 08/28/1975 Today's Date: 11/09/2017   History of Present Illness  presented to ER secondary to vaginal bleeding, dizziness, lightheadedness and fall; admitted wiht acute renal failure, dysfunctional uterine bleeding (non-acute) and AMS (likely related to elevated ammonia, hepatic encephalopathy).  Clinical Impression  Upon evaluation, patient alert and oriented to basic information; follows simple commands, but with noted decrease in processing and initiation.  Generally disinterested in PT evaluation and recommendations, appears to simply "go through the motions" throughout session.  Bilat UE/LE strength and ROM grossly symmetrical and WFL for basic transfers and gait (4-/5); formal MMT difficult due to limited cooperation.  Currently able to complete bed mobility, sit/stand, basic transfers and gait (100') without assist device, sup.  Increased sway with short, choppy steps, but no overt buckling or LOB.  Limited receptiveness to safety cuing, mobility assessment. Will plan to see 1-2 additional visits to ensure continued mobility and balance improvement throughout remaining hospitalization.  May benefit from HHPT at discharge for home safety assessment and general balance/mobility progression.    Follow Up Recommendations Home health PT    Equipment Recommendations       Recommendations for Other Services       Precautions / Restrictions Precautions Precautions: Fall Restrictions Weight Bearing Restrictions: No      Mobility  Bed Mobility Overal bed mobility: Modified Independent             General bed mobility comments: prefers to 'crawl' into bed when approaching after gait trial  Transfers Overall transfer level: Needs assistance   Transfers: Sit to/from Stand Sit to Stand: Supervision            Ambulation/Gait Ambulation/Gait assistance: Supervision Ambulation Distance  (Feet): 100 Feet Assistive device: None       General Gait Details: broad BOS, increased lateral sway (likely due to body habitus); slightly staggered, but no buckling or LOB.  Broad turning radius with decreased higher-level balance reactions  Stairs            Wheelchair Mobility    Modified Rankin (Stroke Patients Only)       Balance Overall balance assessment: Needs assistance Sitting-balance support: No upper extremity supported;Feet supported Sitting balance-Leahy Scale: Good     Standing balance support: No upper extremity supported Standing balance-Leahy Scale: Fair                               Pertinent Vitals/Pain Pain Assessment: No/denies pain    Home Living Family/patient expects to be discharged to:: Private residence Living Arrangements: Alone   Type of Home: House Home Access: Stairs to enter   [employer] of Steps: 1 step onto deck Home Layout: One level Home Equipment: None      Prior Function Level of Independence: Independent         Comments: Indep with ADLs, household and community mobilization; endorses at least 3 falls within previous 6 months (attributes to ETOH consumption)     Hand Dominance        Extremity/Trunk Assessment   Upper Extremity Assessment Upper Extremity Assessment: Overall WFL for tasks assessed    Lower Extremity Assessment Lower Extremity Assessment: Overall WFL for tasks assessed(grossly at least 4-/5 throughout; limited participation with formal MMT)       Communication   Communication: No difficulties  Cognition Arousal/Alertness: Awake/alert Behavior During Therapy: Flat affect Overall Cognitive Status: Difficult  to assess                                 General Comments: delayed processing, initiation and overall interaction; limited interest, engagement with session      General Comments      Exercises     Assessment/Plan    PT Assessment  Patient needs continued PT services  PT Problem List Decreased strength;Decreased activity tolerance;Decreased balance;Decreased mobility;Decreased cognition;Cardiopulmonary status limiting activity;Obesity       PT Treatment Interventions DME instruction;Gait training;Stair training;Functional mobility training;Balance training;Therapeutic exercise;Therapeutic activities;Patient/family education    PT Goals (Current goals can be found in the Care Plan section)  Acute Rehab PT Goals Patient Stated Goal: to eat my breakfast and get my bath taken care of PT Goal Formulation: With patient Time For Goal Achievement: 11/23/17 Potential to Achieve Goals: Good    Frequency Min 2X/week   Barriers to discharge Decreased caregiver support      Co-evaluation               AM-PAC PT "6 Clicks" Daily Activity  Outcome Measure Difficulty turning over in bed (including adjusting bedclothes, sheets and blankets)?: None Difficulty moving from lying on back to sitting on the side of the bed? : None Difficulty sitting down on and standing up from a chair with arms (e.g., wheelchair, bedside commode, etc,.)?: None Help needed moving to and from a bed to chair (including a wheelchair)?: A Little Help needed walking in hospital room?: A Little Help needed climbing 3-5 steps with a railing? : A Little 6 Click Score: 21    End of Session   Activity Tolerance: Patient tolerated treatment well Patient left: in bed;with call bell/phone within reach Nurse Communication: Mobility status PT Visit Diagnosis: Difficulty in walking, not elsewhere classified (R26.2);Muscle weakness (generalized) (M62.81)    Time: [id number] PT Time Calculation (min) (ACUTE ONLY): 12 min   Charges:   PT Evaluation $PT Eval Low Complexity: 1 Low     PT G Codes:       Casara Perrier H. Manson PasseyBrown, PT, DPT, NCS 11/09/17, 10:06 AM [phone]

## 2017-11-09 NOTE — Progress Notes (Signed)
[employer] - [place] at [facility]   PATIENT NAME: Brandy Cooper    MR#:  [id number]  DATE OF BIRTH:  [birth year]  SUBJECTIVE:  CHIEF COMPLAINT:   Chief Complaint  Patient presents with  . Vaginal Bleeding   - sleepy today - ammonia is still elevated, hasn't had a bowel movement yet - complains of back pain  REVIEW OF SYSTEMS:  Review of Systems  Constitutional: Positive for malaise/fatigue. Negative for chills and fever.  HENT: Negative for congestion, ear discharge, hearing loss and nosebleeds.   Eyes: Negative for blurred vision and double vision.  Respiratory: Negative for cough, shortness of breath and wheezing.   Cardiovascular: Negative for chest pain and palpitations.  Gastrointestinal: Negative for abdominal pain, constipation, diarrhea, nausea and vomiting.  Genitourinary: Negative for dysuria.  Musculoskeletal: Positive for back pain and myalgias.  Neurological: Positive for weakness. Negative for dizziness, speech change, focal weakness, seizures and headaches.    DRUG ALLERGIES:   Allergies  Allergen Reactions  . Celebrex [Celecoxib] Hives  . Morphine And Related Itching    VITALS:  Blood pressure (!) 105/50, pulse (!) 101, temperature 98.1 F (36.7 C), resp. rate (!) 22, height 5\' 6"  (1.676 m), weight 116 kg (255 lb 11.7 oz), SpO2 100 %.  PHYSICAL EXAMINATION:  Physical Exam  GENERAL:  42 y.o.-year-old patient lying in the bed with no acute distress.  EYES: Pupils equal, round, reactive to light and accommodation. No scleral icterus. Extraocular muscles intact. Icteric sclerae HEENT: Head atraumatic, normocephalic. Oropharynx and nasopharynx clear.  NECK:  Supple, no jugular venous distention. No thyroid enlargement, no tenderness.  LUNGS: Normal breath sounds bilaterally, no wheezing, rales,rhonchi or crepitation. No use of accessory muscles of respiration. Decreased bibasilar breath sounds CARDIOVASCULAR: S1, S2 normal. No murmurs,  rubs, or gallops.  ABDOMEN: Soft, obese, nontender, nondistended. Bowel sounds present. No organomegaly or mass.  EXTREMITIES: No pedal edema, cyanosis, or clubbing.  NEUROLOGIC: Cranial nerves II through XII are intact. Muscle strength 5/5 in all extremities. Sensation intact. Gait not checked.  Global weakness noted PSYCHIATRIC: The patient is alert and oriented x 2-3.  Sleepy SKIN: No obvious rash, lesion, or ulcer.    LABORATORY PANEL:   CBC Recent Labs  Lab 11/07/17 0526  WBC 3.8  HGB 12.6  HCT 35.7  PLT 115*   ------------------------------------------------------------------------------------------------------------------  Chemistries  Recent Labs  Lab 11/08/17 0742 11/09/17 0603  NA  --  130*  K  --  4.2  CL  --  101  CO2  --  21*  GLUCOSE  --  111*  BUN  --  19  CREATININE  --  1.70*  CALCIUM  --  8.4*  AST 108*  --   ALT 26  --   ALKPHOS 97  --   BILITOT 7.4*  --    ------------------------------------------------------------------------------------------------------------------  Cardiac Enzymes No results for input(s): TROPONINI in the last 168 hours. ------------------------------------------------------------------------------------------------------------------  RADIOLOGY:  US Abdomen Limited Ruq  Result Date: 11/07/2017 CLINICAL DATA:  Hepatic cirrhosis EXAM: ULTRASOUND ABDOMEN LIMITED RIGHT UPPER QUADRANT COMPARISON:  CT abdomen and pelvis September 22, 2012 FINDINGS: Gallbladder: Surgically absent. Common bile duct: Diameter: 12 mm proximally. Distal common bile duct could not be visualized due to overlying gas. Liver: No focal lesion identified. Liver echogenicity is diffusely increased. Portal vein is patent on color Doppler imaging with normal direction of blood flow towards the liver. Other: There is trace ascites. IMPRESSION: 1. Gallbladder absent. Visualized common bile duct is dilated  to 12 mm. More distal common bile duct could not be visualized  due to overlying gas. This finding may warrant MRCP to further evaluate given the dilatation proximally. 2. Diffuse increase in liver echogenicity, a finding indicative of hepatic steatosis, potentially with underlying parenchymal liver disease. While no focal liver lesions are identified on this study, it must be cautioned that the sensitivity of ultrasound for detection of focal liver lesions is diminished significantly in this circumstance. 3.  Trace ascites. Electronically Signed   By: Bretta BangWilliam  Cooper III M.D.   On: 11/07/2017 17:09    EKG:   Orders placed or performed during the hospital encounter of 11/11/2017  . ED EKG  . ED EKG  . EKG 12-Lead  . EKG 12-Lead  . EKG    ASSESSMENT AND PLAN:   Brandy Cooper  is a 42 y.o. female with a known history of anxiety and depression, status post laparoscopic gastric sleeve resection, dysfunctional uterine bleeding presents to hospital secondary to a fall.  1.  Acute renal failure- initially thought prerenal, however her BUN is not significantly increased. - could be hepatorenal, with new diagnosis of cirrhosis -Discontinued IV fluids and monitor. Will need lasix and aldactone once BP improves - renal US -Hold metformin and other nephrotoxins  2. 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 -Started on lactulose and xifaxan - ammonia still elevated, still no bowel movement- dose increased - GI consulted - will EGD at some point for varices - BP is low to add any lasix or aldactone  3.  Dysfunctional uterine bleeding-going on for a few months, had a left ovarian cyst based on pelvic ultrasound 2 months ago. -Missed recent follow-up visits and increased bleeding with worsening anemia. -Appreciate OB/GYN consult-they have recommended outpatient follow-up since no active bleeding at this time  4.  Depression anxiety-continue home medications.     5. Bacterial vaginosis-  flagyl 500mg  bid for 7 days  6.  Encourage ambulation  7.  DVT prophylaxis-teds and SCDs.  Hold off on heparin products due to bleeding     All the records are reviewed and case discussed with Care Management/Social Workerr. Management plans discussed with the patient, family and they are in agreement.  CODE STATUS: Full Code  TOTAL TIME TAKING CARE OF THIS PATIENT: 38 minutes.   POSSIBLE D/C IN 1-2 DAYS, DEPENDING ON CLINICAL CONDITION.   Enid BaasKALISETTI,Jadarian Mckay M.D on 11/09/2017 at 9:19 AM  Between 7am to 6pm - Pager - [phone]  After 6pm go to [email or web address] - password [email or web address] [email or web address]  [facility] [place] [facility]  Office  [phone]  CC: Primary care physician; Dortha KernBliss, Laura K, MD

## 2017-11-09 NOTE — Progress Notes (Signed)
Vonda Antigua, MD [address] [place], [place], [place], [address] [address], [place], [place], [place], [address] Phone: [phone]  Fax: [phone]  Consultation  Referring Provider:     Dr. Tressia Miners Primary Care Physician:  Lynnell Jude, MD Reason for Consultation:     Cirrhosis  Date of Admission:  10/30/2017 Date of Consultation:  11/09/2017         HPI:   Brandy Cooper is a 42 y.o. female presented post fall.  Patient has history of daily alcohol use.  She has consulted for evaluation of cirrhosis.  Patient denies knowing that she has had cirrhosis, or liver problems prior to this admission.  Also describes having vaginal bleeding at home prior to presentation.  Reportedly had intermittent vaginal bleeding for the last 6 months, has history of dysfunctional uterine bleeding, and has an IUD in place.  States she felt dizzy and fell at home.  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  Cholecystectomy in 2014.  Patient's bilirubin is acutely elevated to 7.4 on this admission.  Baseline last known is 2016 and was normal.  Alkaline phosphatase is normal.  AST of 108, ALT of 26.  Albumin of 2.5.  Platelets are low at 115.  Ethyl alcohol level elevated to 194 in 2016.  Was not on done on this admission.  Ultrasound showed an absent gallbladder.  Visualized common bile duct dilated to 12 mm.  MRCP recommended.  Diffuse increase in liver echogenicity, hepatic steatosis, potentially underlying parenchymal liver disease.  Trace ascites.  Past Medical History:  Diagnosis Date  . Anxiety   . Bipolar affect, depressed ([place])   . Depression     Past Surgical History:  Procedure Laterality Date  . CHOLECYSTECTOMY    . LAPAROSCOPIC GASTRIC SLEEVE RESECTION    . TONSILLECTOMY      Prior to Admission medications   Medication Sig Start Date End Date Taking? Authorizing Provider  ALPRAZolam Duanne Moron) 1 MG tablet Take 2 mg by mouth at  bedtime.    Yes [provider]  buPROPion (WELLBUTRIN XL) 300 MG 24 hr tablet Take 300 mg by mouth daily.   Yes [provider]  metFORMIN (GLUCOPHAGE) 500 MG tablet Take 1,500 mg by mouth daily.    Yes [provider]  valACYclovir (VALTREX) 500 MG tablet Take 500 mg by mouth once. As needed   Yes [provider]  cephALEXin (KEFLEX) 500 MG capsule Take 1 capsule (500 mg total) by mouth every 12 (twelve) hours. Patient not taking: Reported on 11/04/2017 04/04/15   Harvest Dark, MD  diazepam (VALIUM) 5 MG tablet Take 1 tablet (5 mg total) by mouth every 8 (eight) hours as needed for muscle spasms. 10/20/17   Earleen Newport, MD    Family History  Family history unknown: Yes     Social History   Tobacco Use  . Smoking status: Former Smoker    Types: Cigarettes  . Smokeless tobacco: Never Used  . Tobacco comment: quit since 2 months  Substance Use Topics  . Alcohol use: No  . Drug use: No    Allergies as of 11/10/2017 - Review Complete 10/29/2017  Allergen Reaction Noted  . Celebrex [celecoxib] Hives 05/20/2017  . Morphine and related Itching 05/20/2017    Review of Systems:    All systems reviewed and negative except where noted in HPI.   Physical Exam:  Vital signs in last 24 hours: Vitals:  11/08/17 2028 11/09/17 0539 11/09/17 0541 11/09/17 1157  BP: (!) 100/54 (!) 73/49 (!) 105/50 107/64  Pulse: (!) 106 (!) 108 (!) 101 (!) 101  Resp: 20 (!) 22    Temp: 98.1 F (36.7 C) 98.1 F (36.7 C)  98 F (36.7 C)  TempSrc: Oral   Oral  SpO2: 100% 100%  100%  Weight:      Height:       Last BM Date: 11/09/17 General:   Pleasant, cooperative in NAD Head:  Normocephalic and atraumatic. Eyes:   No icterus.   Conjunctiva pink. PERRLA. Ears:  Normal auditory acuity. Neck:  Supple; no masses or thyroidomegaly Lungs: Respirations even and unlabored. Lungs clear to auscultation bilaterally.   No wheezes, crackles, or rhonchi.    Abdomen:  Soft, nondistended, nontender. Normal bowel sounds. No appreciable masses or hepatomegaly.  No rebound or guarding.  Neurologic:  Alert and oriented x3;  grossly normal neurologically. Skin:  Intact without significant lesions or rashes. Cervical Nodes:  No significant cervical adenopathy. Psych:  Alert and cooperative. Normal affect.  LAB RESULTS: Recent Labs    11/07/17 0526  WBC 3.8  HGB 12.6  HCT 35.7  PLT 115*   BMET Recent Labs    11/07/17 0526 11/08/17 0517 11/09/17 0603  NA 131* 131* 130*  K 2.8* 4.4 4.2  CL 99* 102 101  CO2 24 23 21*  GLUCOSE 95 92 111*  BUN '17 17 19  ' CREATININE 1.47* 1.42* 1.70*  CALCIUM 8.3* 8.4* 8.4*   LFT Recent Labs    11/08/17 0742  PROT 6.9  ALBUMIN 2.5*  AST 108*  ALT 26  ALKPHOS 97  BILITOT 7.4*  BILIDIR 3.6*  IBILI 3.8*   PT/INR Recent Labs    11/09/17 0603  LABPROT 25.4*  INR 2.33    STUDIES: US Renal  Result Date: 11/09/2017 CLINICAL DATA:  Acute renal failure EXAM: RENAL ULTRASOUND COMPARISON:  CT abdomen and pelvis September 22, 2012 FINDINGS: Right Kidney: Length: 10.2 cm. Echogenicity and renal cortical thickness are within normal limits. No mass, perinephric fluid, or hydronephrosis visualized. No sonographically demonstrable calculus or ureterectasis. Left Kidney: Length: 11.3 cm. Echogenicity within normal limits. There is renal cortical thinning on the left. No perinephric fluid or hydronephrosis visualized. There is a cyst arising from the lower pole left kidney measuring 2.9 x 2.7 x 3.0 cm. No sonographically demonstrable calculus or ureterectasis. Bladder: Empty and could not be visualized. IMPRESSION: Renal cortical thinning on the left, a finding that may be associated with medical renal disease. Renal cortical thickness on the right is within normal limits. Renal echogenicity normal bilaterally. No obstructing focus in either kidney. There is a cyst in the lower pole left kidney region. Electronically  Signed   By: Lowella Grip III M.D.   On: 11/09/2017 11:28   US Abdomen Limited Ruq  Result Date: 11/07/2017 CLINICAL DATA:  Hepatic cirrhosis EXAM: ULTRASOUND ABDOMEN LIMITED RIGHT UPPER QUADRANT COMPARISON:  CT abdomen and pelvis September 22, 2012 FINDINGS: Gallbladder: Surgically absent. Common bile duct: Diameter: 12 mm proximally. Distal common bile duct could not be visualized due to overlying gas. Liver: No focal lesion identified. Liver echogenicity is diffusely increased. Portal vein is patent on color Doppler imaging with normal direction of blood flow towards the liver. Other: There is trace ascites. IMPRESSION: 1. Gallbladder absent. Visualized common bile duct is dilated to 12 mm. More distal common bile duct could not be visualized due to overlying gas. This finding may warrant  MRCP to further evaluate given the dilatation proximally. 2. Diffuse increase in liver echogenicity, a finding indicative of hepatic steatosis, potentially with underlying parenchymal liver disease. While no focal liver lesions are identified on this study, it must be cautioned that the sensitivity of ultrasound for detection of focal liver lesions is diminished significantly in this circumstance. 3.  Trace ascites. Electronically Signed   By: Lowella Grip III M.D.   On: 11/07/2017 17:09      Impression / Plan:   Brandy Cooper is a 42 y.o. y/o female admitted with vaginal bleeding at home, status post fall, history of daily alcohol use, and GI consulted for evaluation of cirrhosis  Underlying cirrhosis likely due to patient's alcohol abuse Bilirubin is acutely elevated, but alk phos is normal However, a ultrasound shows dilated common bile duct, and MRCP is indicated to rule out common bile duct obstruction MRCP ordered  Avoid hepatotoxic drugs Repeat CMP in a.m. Folate, thiamine, CIWA protocol  No episodes of recent or active GI bleeding present No indication for urgent EGD at this time  If  MRCP is negative for choledocholithiasis, can obtain further workup of cirrhosis, and elevated liver enzymes with morning labs tomorrow  Ammonia is elevated Continue rifaximin Titrate lactulose to 2-3 bowel movements a day.  Increase in dose if bowel movements not at goal. As per nurse, patient's encephalopathy is improving  Would recommend nephrology consult for elevated creatinine.  Thank you for involving me in the care of this patient.      LOS: 3 days   Virgel Manifold, MD  11/09/2017, 3:43 PM

## 2017-11-10 ENCOUNTER — Inpatient Hospital Stay: Payer: BLUE CROSS/BLUE SHIELD

## 2017-11-10 LAB — HEPATIC FUNCTION PANEL
ALT: 23 U/L (ref 14–54)
AST: 84 U/L — ABNORMAL HIGH (ref 15–41)
Albumin: 2.6 g/dL — ABNORMAL LOW (ref 3.5–5.0)
Alkaline Phosphatase: 72 U/L (ref 38–126)
BILIRUBIN DIRECT: 3.7 mg/dL — AB (ref 0.1–0.5)
BILIRUBIN TOTAL: 7.9 mg/dL — AB (ref 0.3–1.2)
Indirect Bilirubin: 4.2 mg/dL — ABNORMAL HIGH (ref 0.3–0.9)
Total Protein: 6.2 g/dL — ABNORMAL LOW (ref 6.5–8.1)

## 2017-11-10 LAB — BASIC METABOLIC PANEL
ANION GAP: 7 (ref 5–15)
BUN: 20 mg/dL (ref 6–20)
CALCIUM: 8.8 mg/dL — AB (ref 8.9–10.3)
CO2: 21 mmol/L — ABNORMAL LOW (ref 22–32)
Chloride: 104 mmol/L (ref 101–111)
Creatinine, Ser: 1.52 mg/dL — ABNORMAL HIGH (ref 0.44–1.00)
GFR calc Af Amer: 48 mL/min — ABNORMAL LOW (ref >60)
GFR, EST NON AFRICAN AMERICAN: 42 mL/min — AB (ref >60)
Glucose, Bld: 116 mg/dL — ABNORMAL HIGH (ref 65–99)
Potassium: 4.3 mmol/L (ref 3.5–5.1)
Sodium: 132 mmol/L — ABNORMAL LOW (ref 135–145)

## 2017-11-10 LAB — AMMONIA: Ammonia: 139 umol/L — ABNORMAL HIGH (ref 9–35)

## 2017-11-10 MED ORDER — FLEET ENEMA 7-19 GM/118ML RE ENEM: 1.0000 | ENEMA | Freq: Once | RECTAL | Status: DC

## 2017-11-10 MED ORDER — OXYCODONE-ACETAMINOPHEN 5-325 MG PO TABS
1.0000 | ORAL_TABLET | Freq: Four times a day (QID) | ORAL | Status: DC | PRN
  Administered 2017-11-10 – 2017-11-21 (×12): 1 via ORAL
  Filled 2017-11-10 (×13): qty 1

## 2017-11-10 NOTE — Progress Notes (Signed)
[facility] [place] [facility]   PATIENT NAME: Brandy Cooper    MR#:  [id number]  DATE OF BIRTH:  Feb 13, 1976  SUBJECTIVE:  CHIEF COMPLAINT:   Chief Complaint  Patient presents with  . Vaginal Bleeding   - has back pain, more alert and oriented - ammonia still elevated - having BM since last night  REVIEW OF SYSTEMS:  Review of Systems  Constitutional: Positive for malaise/fatigue. Negative for chills and fever.  HENT: Negative for congestion, ear discharge, hearing loss and nosebleeds.   Eyes: Negative for blurred vision and double vision.  Respiratory: Negative for cough, shortness of breath and wheezing.   Cardiovascular: Negative for chest pain and palpitations.  Gastrointestinal: Negative for abdominal pain, constipation, diarrhea, nausea and vomiting.  Genitourinary: Negative for dysuria.  Musculoskeletal: Positive for back pain and myalgias.  Neurological: Positive for weakness. Negative for dizziness, speech change, focal weakness, seizures and headaches.    DRUG ALLERGIES:   Allergies  Allergen Reactions  . Celebrex [Celecoxib] Hives  . Morphine And Related Itching    VITALS:  Blood pressure (!) 99/56, pulse (!) 110, temperature 98.1 F (36.7 C), resp. rate 19, height 5\' 6"  (1.676 m), weight 116 kg (255 lb 11.7 oz), SpO2 96 %.  PHYSICAL EXAMINATION:  Physical Exam  GENERAL:  42 y.o.-year-old patient lying in the bed with no acute distress.  EYES: Pupils equal, round, reactive to light and accommodation. No scleral icterus. Extraocular muscles intact. Icteric sclerae HEENT: Head atraumatic, normocephalic. Oropharynx and nasopharynx clear.  NECK:  Supple, no jugular venous distention. No thyroid enlargement, no tenderness.  LUNGS: Normal breath sounds bilaterally, no wheezing, rales,rhonchi or crepitation. No use of accessory muscles of respiration. Decreased bibasilar breath sounds CARDIOVASCULAR: S1, S2 normal. No murmurs, rubs, or  gallops.  ABDOMEN: Soft, obese, nontender, nondistended. Bowel sounds present. No organomegaly or mass.  EXTREMITIES: No pedal edema, cyanosis, or clubbing.  NEUROLOGIC: Cranial nerves II through XII are intact. Muscle strength 5/5 in all extremities. Sensation intact. Gait not checked.  Global weakness noted PSYCHIATRIC: The patient is alert and oriented x 3 SKIN: No obvious rash, lesion, or ulcer.    LABORATORY PANEL:   CBC Recent Labs  Lab 11/07/17 0526  WBC 3.8  HGB 12.6  HCT 35.7  PLT 115*   ------------------------------------------------------------------------------------------------------------------  Chemistries  Recent Labs  Lab 11/10/17 0506  NA 132*  K 4.3  CL 104  CO2 21*  GLUCOSE 116*  BUN 20  CREATININE 1.52*  CALCIUM 8.8*  AST 84*  ALT 23  ALKPHOS 72  BILITOT 7.9*   ------------------------------------------------------------------------------------------------------------------  Cardiac Enzymes No results for input(s): TROPONINI in the last 168 hours. ------------------------------------------------------------------------------------------------------------------  RADIOLOGY:  Dg Lumbar Spine 2-3 Views  Result Date: 11/10/2017 CLINICAL DATA:  Low back pain for 2 years, recent fall with increased pain, initial encounter EXAM: LUMBAR SPINE - 3 VIEW COMPARISON:  None. FINDINGS: Five lumbar type vertebral bodies are well visualized. Disc space narrowing is noted at L3-4, L4-5 and L5-S1. Mild osteophytic changes are seen. No anterolisthesis is noted. No soft tissue changes are noted. IMPRESSION: Degenerative change without acute abnormality. Electronically Signed   By: Alcide CleverMark  Lukens M.D.   On: 11/10/2017 08:30   Dg Abd 1 View  Result Date: 11/10/2017 CLINICAL DATA:  Constipation for 2-3 days, history of cirrhosis EXAM: ABDOMEN - 1 VIEW COMPARISON:  Lumbar spine films of 11/10/2017 FINDINGS: Supine views of the abdomen show no evidence of bowel  obstruction. Surgical clips are  present in the right upper quadrant from prior cholecystectomy and sutures are noted in the left upper quadrant possibly due to prior gastric surgery. IUD is noted in the mid pelvis. No opaque calculi are seen. There are degenerative changes in the lower lumbar spine. IMPRESSION: 1. No bowel obstruction. 2. No opaque calculi. 3. Degenerative change in the mid to lower lumbar spine Electronically Signed   By: Dwyane Dee M.D.   On: 11/10/2017 11:57   US Renal  Result Date: 11/09/2017 CLINICAL DATA:  Acute renal failure EXAM: RENAL ULTRASOUND COMPARISON:  CT abdomen and pelvis September 22, 2012 FINDINGS: Right Kidney: Length: 10.2 cm. Echogenicity and renal cortical thickness are within normal limits. No mass, perinephric fluid, or hydronephrosis visualized. No sonographically demonstrable calculus or ureterectasis. Left Kidney: Length: 11.3 cm. Echogenicity within normal limits. There is renal cortical thinning on the left. No perinephric fluid or hydronephrosis visualized. There is a cyst arising from the lower pole left kidney measuring 2.9 x 2.7 x 3.0 cm. No sonographically demonstrable calculus or ureterectasis. Bladder: Empty and could not be visualized. IMPRESSION: Renal cortical thinning on the left, a finding that may be associated with medical renal disease. Renal cortical thickness on the right is within normal limits. Renal echogenicity normal bilaterally. No obstructing focus in either kidney. There is a cyst in the lower pole left kidney region. Electronically Signed   By: Bretta Bang III M.D.   On: 11/09/2017 11:28   Mr Abdomen Mrcp Wo Contrast  Result Date: 11/09/2017 CLINICAL DATA:  Cirrhosis. Surgical history of gastric sleeve and cholecystectomy. Hyperbilirubinemia. EXAM: MRI ABDOMEN WITHOUT CONTRAST  (INCLUDING MRCP) TECHNIQUE: Multiplanar multisequence MR imaging of the abdomen was performed. Heavily T2-weighted images of the biliary and pancreatic  ducts were obtained, and three-dimensional MRCP images were rendered by post processing. COMPARISON:  CT 09/22/2012 FINDINGS: Exam is degraded by patient respiratory motion. Patient encephalopathic and could not follow breath hold commands. Free breathing technique use. Lower chest:  Lung bases are clear. Hepatobiliary: Marked loss of signal intensity on opposed phase imaging consistent with hepatic steatosis (series 18). No intrahepatic biliary duct dilatation. The common bile duct is mildly dilated to 9 mm following cholecystectomy. No choledocholithiasis. No IV contrast administered. Pancreas: Normal pancreatic parenchymal intensity. No ductal dilatation or inflammation. Spleen: Moderate splenomegaly with the spleen measuring 16 cm in craniocaudad dimension Adrenals/urinary tract: Adrenal glands and kidneys are normal. Stomach/Bowel: Stomach limited view of the bowel is unremarkable. Vascular/Lymphatic: Normal caliber Musculoskeletal: No aggressive osseous lesion IMPRESSION: 1. No evidence of biliary obstruction. Mild extrahepatic duct dilatation typical following cholecystectomy. 2. Severe hepatic steatosis. 3. No discrete lesion liver identified on noncontrast exam. 4. Mild splenomegaly. 5. No ascites. Electronically Signed   By: Genevive Bi M.D.   On: 11/09/2017 23:37    EKG:   Orders placed or performed during the hospital encounter of 11/17/2017  . ED EKG  . ED EKG  . EKG 12-Lead  . EKG 12-Lead  . EKG    ASSESSMENT AND PLAN:   Kiarah Eckstein  is a 42 y.o. female with a known history of anxiety and depression, status post laparoscopic gastric sleeve resection, dysfunctional uterine bleeding presents to hospital secondary to a fall.  1. 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 -Started on lactulose and xifaxan - ammonia still elevated,  - dose increased - GI consulted - will EGD as outpt - BP is low to add any lasix or  aldactone  2.  Acute renal failure- initially thought prerenal, however her BUN is not significantly increased. - could be hepatorenal, with new diagnosis of cirrhosis -Discontinued IV fluids and monitor. Will need lasix and aldactone once BP improves -Hold metformin and other nephrotoxins  3.  Dysfunctional uterine bleeding-going on for a few months, had a left ovarian cyst based on pelvic ultrasound 2 months ago. -Missed recent follow-up visits and increased bleeding with worsening anemia. -Appreciate OB/GYN consult-they have recommended outpatient follow-up since no active bleeding at this time  4.  Depression anxiety-continue home medications.     5. Bacterial vaginosis- flagyl 500mg  bid for 7 days  6.  Encourage ambulation  7.  DVT prophylaxis-teds and SCDs.  Hold off on heparin products due to bleeding  Patient requested transfer to [facility], however they do not have any beds available at this time.  Conveyed to patient   All the records are reviewed and case discussed with Care Management/Social Workerr. Management plans discussed with the patient, family and they are in agreement.  CODE STATUS: Full Code  TOTAL TIME TAKING CARE OF THIS PATIENT: 38 minutes.   POSSIBLE D/C IN 1-2 DAYS, DEPENDING ON CLINICAL CONDITION.   Enid Baas M.D on 11/10/2017 at 4:04 PM  Between 7am to 6pm - Pager - [phone]  After 6pm go to [email or web address] - password [email or web address]  [facility]  Office  [phone]  CC: Primary care physician; Dortha Kern, MD

## 2017-11-10 NOTE — Progress Notes (Signed)
Melodie Bouillon, MD [address], [address], [place], [place], [address] [address], [address], [place], [place], [address] Phone: [phone]  Fax: [phone]   Subjective: Patient sitting up in chair.  Denies any abdominal pain.   Denies any nausea or vomiting.   Objective: Exam: Vital signs in last 24 hours: Vitals:   11/09/17 2024 11/09/17 2026 11/10/17 0527 11/10/17 1220  BP: (!) 78/34 (!) 94/57 (!) 101/56 (!) 99/56  Pulse: (!) 112 (!) 113 (!) 104 (!) 110  Resp: 20  18 19   Temp: 98.5 F (36.9 C)  98.4 F (36.9 C) 98.1 F (36.7 C)  TempSrc: Oral  Oral   SpO2: 100% 100% 96% 96%  Weight:      Height:       Weight change:   Intake/Output Summary (Last 24 hours) at 11/10/2017 1355 Last data filed at 11/10/2017 0500 Gross per 24 hour  Intake 120 ml  Output 300 ml  Net -180 ml    General: No acute distress, AAO x3 Abd: Soft, NT/ND, No HSM Skin: Warm, no rashes Neck: Supple, Trachea midline   Lab Results: Lab Results  Component Value Date   WBC 3.8 11/07/2017   HGB 12.6 11/07/2017   HCT 35.7 11/07/2017   MCV 104.9 (H) 11/07/2017   PLT 115 (L) 11/07/2017   Micro Results: Recent Results (from the past 240 hour(s))  Wet prep, genital     Status: Abnormal   Collection Time: 12/01/17  8:31 AM  Result Value Ref Range Status   Yeast Wet Prep HPF POC NONE SEEN NONE SEEN Final   Trich, Wet Prep NONE SEEN NONE SEEN Final   Clue Cells Wet Prep HPF POC PRESENT (A) NONE SEEN Final   WBC, Wet Prep HPF POC MODERATE (A) NONE SEEN Final   Sperm NONE SEEN  Final    Comment: Performed at [facility], [address] [place], [place] [address]  Chlamydia/NGC rt PCR [facility] only)     Status: None   Collection Time: 12-01-2017  8:31 AM  Result Value Ref Range Status   Specimen source GC/Chlam CHLAMYDIA SPECIES  Final   Chlamydia Tr NOT DETECTED NOT DETECTED Final   N gonorrhoeae NOT DETECTED NOT DETECTED Final    Comment: (NOTE) 100  This methodology has  not been evaluated in pregnant women or in 200  patients with a history of hysterectomy. 300 400  This methodology will not be performed on patients less than 59  years of age. Performed at [facility], [address] [place], [place] [address]    Studies/Results: Dg Lumbar Spine 2-3 Views  Result Date: 11/10/2017 CLINICAL DATA:  Low back pain for 2 years, recent fall with increased pain, initial encounter EXAM: LUMBAR SPINE - 3 VIEW COMPARISON:  None. FINDINGS: Five lumbar type vertebral bodies are well visualized. Disc space narrowing is noted at L3-4, L4-5 and L5-S1. Mild osteophytic changes are seen. No anterolisthesis is noted. No soft tissue changes are noted. IMPRESSION: Degenerative change without acute abnormality. Electronically Signed   By: Alcide Clever M.D.   On: 11/10/2017 08:30   Dg Abd 1 View  Result Date: 11/10/2017 CLINICAL DATA:  Constipation for 2-3 days, history of cirrhosis EXAM: ABDOMEN - 1 VIEW COMPARISON:  Lumbar spine films of 11/10/2017 FINDINGS: Supine views of the abdomen show no evidence of bowel obstruction. Surgical clips are present in the right upper quadrant from prior cholecystectomy and sutures are noted in the left upper quadrant possibly  due to prior gastric surgery. IUD is noted in the mid pelvis. No opaque calculi are seen. There are degenerative changes in the lower lumbar spine. IMPRESSION: 1. No bowel obstruction. 2. No opaque calculi. 3. Degenerative change in the mid to lower lumbar spine Electronically Signed   By: Dwyane DeePaul  Barry M.D.   On: 11/10/2017 11:57   [place] Renal  Result Date: 11/09/2017 CLINICAL DATA:  Acute renal failure EXAM: RENAL ULTRASOUND COMPARISON:  CT abdomen and pelvis September 22, 2012 FINDINGS: Right Kidney: Length: 10.2 cm. Echogenicity and renal cortical thickness are within normal limits. No mass, perinephric fluid, or hydronephrosis visualized. No sonographically demonstrable calculus or ureterectasis. Left Kidney:  Length: 11.3 cm. Echogenicity within normal limits. There is renal cortical thinning on the left. No perinephric fluid or hydronephrosis visualized. There is a cyst arising from the lower pole left kidney measuring 2.9 x 2.7 x 3.0 cm. No sonographically demonstrable calculus or ureterectasis. Bladder: Empty and could not be visualized. IMPRESSION: Renal cortical thinning on the left, a finding that may be associated with medical renal disease. Renal cortical thickness on the right is within normal limits. Renal echogenicity normal bilaterally. No obstructing focus in either kidney. There is a cyst in the lower pole left kidney region. Electronically Signed   By: Bretta BangWilliam  [place] III M.D.   On: 11/09/2017 11:28   Mr Abdomen Mrcp Wo Contrast  Result Date: 11/09/2017 CLINICAL DATA:  Cirrhosis. Surgical history of gastric sleeve and cholecystectomy. Hyperbilirubinemia. EXAM: MRI ABDOMEN WITHOUT CONTRAST  (INCLUDING MRCP) TECHNIQUE: Multiplanar multisequence MR imaging of the abdomen was performed. Heavily T2-weighted images of the biliary and pancreatic ducts were obtained, and three-dimensional MRCP images were rendered by post processing. COMPARISON:  CT 09/22/2012 FINDINGS: Exam is degraded by patient respiratory motion. Patient encephalopathic and could not follow breath hold commands. Free breathing technique use. Lower chest:  Lung bases are clear. Hepatobiliary: Marked loss of signal intensity on opposed phase imaging consistent with hepatic steatosis (series 18). No intrahepatic biliary duct dilatation. The common bile duct is mildly dilated to 9 mm following cholecystectomy. No choledocholithiasis. No IV contrast administered. Pancreas: Normal pancreatic parenchymal intensity. No ductal dilatation or inflammation. Spleen: Moderate splenomegaly with the spleen measuring 16 cm in craniocaudad dimension Adrenals/urinary tract: Adrenal glands and kidneys are normal. Stomach/Bowel: Stomach limited view of the  bowel is unremarkable. Vascular/Lymphatic: Normal caliber Musculoskeletal: No aggressive osseous lesion IMPRESSION: 1. No evidence of biliary obstruction. Mild extrahepatic duct dilatation typical following cholecystectomy. 2. Severe hepatic steatosis. 3. No discrete lesion liver identified on noncontrast exam. 4. Mild splenomegaly. 5. No ascites. Electronically Signed   By: Genevive BiStewart  Edmunds M.D.   On: 11/09/2017 23:37   Medications:  Scheduled Meds: . ALPRAZolam  2 mg Oral QHS  . buPROPion  300 mg Oral Daily  . folic acid  1 mg Oral Daily  . lactulose  30 g Oral TID  . lidocaine  1 patch Transdermal Q24H  . metroNIDAZOLE  500 mg Oral Q12H  . rifaximin  550 mg Oral BID  . sodium chloride flush  3 mL Intravenous Q12H  . sodium phosphate  1 enema Rectal Once   Continuous Infusions: . sodium chloride     PRN Meds:.sodium chloride, acetaminophen **OR** acetaminophen, alum & mag hydroxide-simeth, bisacodyl, diazepam, ondansetron **OR** ondansetron (ZOFRAN) IV, oxyCODONE-acetaminophen, sodium chloride flush   Assessment: Active Problems:   ARF (acute renal failure) (HCC)  Elevated liver enzymes, cirrhosis  Plan: 42 year old female admitted with vaginal bleeding, status post fall, history  of daily alcohol use, and GI consulted for evaluation of cirrhosis, with elevated bilirubin and AST  Patient underwent MRCP, that shows no evidence of biliary obstruction.  Mild extrahepatic duct dilation is reported to be typical following cholecystectomy.  Severe hepatic steatosis noted.  Liver workup ordered and pending Ferritin elevated but likely due to acute phase reactant Elevated liver enzymes are likely due to alcoholic hepatitis and we are just seeing the tail end of it. Ie. Transaminases were likely higher before her hospital admission. Discriminant function is high at 54 based on 4/15 labs If rest of liver workup is normal, Steroids can be considered. But patient will need to follow up  reliably in clinic to taper steroids.  Will check labs again tomorrow and recalculate DF and await pending labs Folate, Thiamine CIWA protocol Lactulose, rifaximin No ascites present on MRI imaging    LOS: 4 days   Melodie Bouillon, MD 11/10/2017, 1:55 PM

## 2017-11-11 DIAGNOSIS — F05 Delirium due to known physiological condition: Secondary | ICD-10-CM

## 2017-11-11 DIAGNOSIS — R410 Disorientation, unspecified: Secondary | ICD-10-CM

## 2017-11-11 DIAGNOSIS — R945 Abnormal results of liver function studies: Secondary | ICD-10-CM

## 2017-11-11 LAB — BASIC METABOLIC PANEL
ANION GAP: 6 (ref 5–15)
BUN: 21 mg/dL — ABNORMAL HIGH (ref 6–20)
CO2: 22 mmol/L (ref 22–32)
Calcium: 8.6 mg/dL — ABNORMAL LOW (ref 8.9–10.3)
Chloride: 106 mmol/L (ref 101–111)
Creatinine, Ser: 1.22 mg/dL — ABNORMAL HIGH (ref 0.44–1.00)
GFR calc Af Amer: >60 mL/min (ref >60)
GFR calc non Af Amer: 54 mL/min — ABNORMAL LOW (ref >60)
GLUCOSE: 113 mg/dL — AB (ref 65–99)
POTASSIUM: 4.1 mmol/L (ref 3.5–5.1)
Sodium: 134 mmol/L — ABNORMAL LOW (ref 135–145)

## 2017-11-11 LAB — CBC
HEMATOCRIT: 27.9 % — AB (ref 35.0–47.0)
HEMOGLOBIN: 9.6 g/dL — AB (ref 12.0–16.0)
MCH: 37 pg — ABNORMAL HIGH (ref 26.0–34.0)
MCHC: 34.5 g/dL (ref 32.0–36.0)
MCV: 107 fL — ABNORMAL HIGH (ref 80.0–100.0)
Platelets: 74 10*3/uL — ABNORMAL LOW (ref 150–440)
RBC: 2.6 MIL/uL — ABNORMAL LOW (ref 3.80–5.20)
RDW: 24.5 % — ABNORMAL HIGH (ref 11.5–14.5)
WBC: 3.2 10*3/uL — ABNORMAL LOW (ref 3.6–11.0)

## 2017-11-11 LAB — AMMONIA: Ammonia: 109 umol/L — ABNORMAL HIGH (ref 9–35)

## 2017-11-11 LAB — FERRITIN: Ferritin: 1050 ng/mL — ABNORMAL HIGH (ref 11–307)

## 2017-11-11 NOTE — Consult Note (Signed)
[facility] Consult   Reason for Consult: Consult for 42 year old woman with a history of alcohol abuse and chronic benzodiazepine use.  Now in the hospital with cirrhosis and bleeding and anemia.  Concern about anxiety and depression Referring Physician:  Manuella Ghazi Patient Identification: Brandy Cooper MRN:  [id number] Principal Diagnosis: Subacute delirium Diagnosis:   Patient Active Problem List   Diagnosis Date Noted  . Subacute delirium [F05] 11/11/2017  . ARF (acute renal failure) ([place]) [N17.9] 11/14/2017  . Substance induced mood disorder ([place]) [F19.94] 04/04/2015  . Alcohol abuse [F10.10] 04/04/2015  . Benzodiazepine overdose [T42.4X1A] 04/04/2015    Total Time spent with patient: 1 hour  Subjective:   Brandy Cooper is a 42 y.o. female patient admitted with "I am okay now but I will not be when I go home".  HPI: Patient seen chart reviewed.  42 year old woman in the hospital with what she says she thought was just having excessive menstrual periods but turns out to be probably GI bleeding related to cirrhosis.  Patient says that she is not feeling depressed.  Denies suicidal thoughts.  Does not feel acutely anxious or panicky.  He does feel run down and tired and sick.  She is worried about how she will function when she goes home.  Patient says she had been taking her Xanax and Wellbutrin regularly before coming into the hospital.  Does not complain about acute psychiatric symptoms so much is having been fatigued recently.  Patient gave contradictory responses about alcohol use.  Initially she told me that she had been drinking up to 1/5 of liquor a day before admission.  After a minute she corrected herself and said that she had actually stopped drinking heavily back in September and has been drinking only a little bit recently.  Social history: Tells me that she lives by herself right now.  Used to be married and have more contact with family but says that currently she  is pretty alienated from people.  Was working until she recently lost her job because of her sickness.  Medical history: Patient is very anemic ammonia is quite high newly discovered cirrhosis.  Substance abuse history: Long-standing problems with alcohol abuse and benzodiazepine and narcotic use.  I confirmed by the controlled substance database that the patient has still been getting prescriptions of a total of 6 mg a day of alprazolam every month from a primary care doctor.  This in spite of the fact that she has a clear drug abuse problem.  She could not give me a very straight answer about withdrawal.  I do not see any indication in the chart that she has had alcohol withdrawal seizures or DTs.  Past Psychiatric History: Patient has been diagnosed with depression and anxiety but a lot of that if not all of it is directly related to substance abuse.  Risk to Self: Is patient at risk for suicide?: No Risk to Others:   Prior Inpatient Therapy:   Prior Outpatient Therapy:    Past Medical History:  Past Medical History:  Diagnosis Date  . Anxiety   . Bipolar affect, depressed ([place])   . Depression     Past Surgical History:  Procedure Laterality Date  . CHOLECYSTECTOMY    . LAPAROSCOPIC GASTRIC SLEEVE RESECTION    . TONSILLECTOMY     Family History:  Family History  Family history unknown: Yes   Family Psychiatric  History: She feels several people in her family had alcohol problems Social History:  Social History   Substance and Sexual Activity  Alcohol Use No     Social History   Substance and Sexual Activity  Drug Use No    Social History   Socioeconomic History  . Marital status: Legally Separated    Spouse name: Not on file  . Number of children: Not on file  . Years of education: Not on file  . Highest education level: Not on file  Occupational History  . Not on file  Social Needs  . Financial resource strain: Not on file  . Food insecurity:    Worry: Not  on file    Inability: Not on file  . Transportation needs:    Medical: Not on file    Non-medical: Not on file  Tobacco Use  . Smoking status: Former Smoker    Types: Cigarettes  . Smokeless tobacco: Never Used  . Tobacco comment: quit since 2 months  Substance and Sexual Activity  . Alcohol use: No  . Drug use: No  . Sexual activity: Not on file  Lifestyle  . Physical activity:    Days per week: Not on file    Minutes per session: Not on file  . Stress: Not on file  Relationships  . Social connections:    Talks on phone: Not on file    Gets together: Not on file    Attends religious service: Not on file    Active member of club or organization: Not on file    Attends meetings of clubs or organizations: Not on file    Relationship status: Not on file  Other Topics Concern  . Not on file  Social History Narrative   Lives by herself, unsteady gait, ambulates without any assisted device   Additional Social History:    Allergies:   Allergies  Allergen Reactions  . Celebrex [Celecoxib] Hives  . Morphine And Related Itching    Labs:  Results for orders placed or performed during the hospital encounter of 10/26/2017 (from the past 48 hour(s))  Basic metabolic panel     Status: Abnormal   Collection Time: 11/10/17  5:06 AM  Result Value Ref Range   Sodium 132 (L) 135 - 145 mmol/L   Potassium 4.3 3.5 - 5.1 mmol/L   Chloride 104 101 - 111 mmol/L   CO2 21 (L) 22 - 32 mmol/L   Glucose, Bld 116 (H) 65 - 99 mg/dL   BUN 20 6 - 20 mg/dL   Creatinine, Ser 1.52 (H) 0.44 - 1.00 mg/dL   Calcium 8.8 (L) 8.9 - 10.3 mg/dL   GFR calc non Af Amer 42 (L) >60 mL/min   GFR calc Af Amer 48 (L) >60 mL/min    Comment: (NOTE) The eGFR has been calculated using the CKD EPI equation. This calculation has not been validated in all clinical situations. eGFR's persistently <60 mL/min signify possible Chronic Kidney Disease.    Anion gap 7 5 - 15    Comment: Performed at [facility], [address] [place], [place] [address]  Ammonia     Status: Abnormal   Collection Time: 11/10/17  5:06 AM  Result Value Ref Range   Ammonia 139 (H) 9 - 35 umol/L    Comment: Performed at [facility], [address] [place], [place] [address]  Hepatic function panel     Status: Abnormal   Collection Time: 11/10/17  5:06 AM  Result Value Ref Range   Total Protein 6.2 (L) 6.5 -  8.1 g/dL   Albumin 2.6 (L) 3.5 - 5.0 g/dL   AST 84 (H) 15 - 41 U/L   ALT 23 14 - 54 U/L   Alkaline Phosphatase 72 38 - 126 U/L   Total Bilirubin 7.9 (H) 0.3 - 1.2 mg/dL   Bilirubin, Direct 3.7 (H) 0.1 - 0.5 mg/dL   Indirect Bilirubin 4.2 (H) 0.3 - 0.9 mg/dL    Comment: Performed at [facility], [address] [place], [place] [address]  Ammonia     Status: Abnormal   Collection Time: 11/11/17  5:07 AM  Result Value Ref Range   Ammonia 109 (H) 9 - 35 umol/L    Comment: Performed at [facility], [address] [place], [place] [address]  Basic metabolic panel     Status: Abnormal   Collection Time: 11/11/17  5:07 AM  Result Value Ref Range   Sodium 134 (L) 135 - 145 mmol/L   Potassium 4.1 3.5 - 5.1 mmol/L   Chloride 106 101 - 111 mmol/L   CO2 22 22 - 32 mmol/L   Glucose, Bld 113 (H) 65 - 99 mg/dL   BUN 21 (H) 6 - 20 mg/dL   Creatinine, Ser 1.22 (H) 0.44 - 1.00 mg/dL   Calcium 8.6 (L) 8.9 - 10.3 mg/dL   GFR calc non Af Amer 54 (L) >60 mL/min   GFR calc Af Amer >60 >60 mL/min    Comment: (NOTE) The eGFR has been calculated using the CKD EPI equation. This calculation has not been validated in all clinical situations. eGFR's persistently <60 mL/min signify possible Chronic Kidney Disease.    Anion gap 6 5 - 15    Comment: Performed at [facility], [address] [place], [place] [address]  CBC     Status: Abnormal   Collection Time: 11/11/17  5:07 AM  Result Value Ref Range   WBC 3.2 (L) 3.6 - 11.0 K/uL   RBC 2.60 (L) 3.80 - 5.20 MIL/uL    Hemoglobin 9.6 (L) 12.0 - 16.0 g/dL   HCT 27.9 (L) 35.0 - 47.0 %   MCV 107.0 (H) 80.0 - 100.0 fL   MCH 37.0 (H) 26.0 - 34.0 pg   MCHC 34.5 32.0 - 36.0 g/dL   RDW 24.5 (H) 11.5 - 14.5 %   Platelets 74 (L) 150 - 440 K/uL    Comment: Performed at [facility], [address] [place], [place] [address]  Ferritin     Status: Abnormal   Collection Time: 11/11/17  5:07 AM  Result Value Ref Range   Ferritin 1,050 (H) 11 - 307 ng/mL    Comment: Performed at [facility], [address] [place], [place] [address]    Current Facility-Administered Medications  Medication Dose Route Frequency Provider Last Rate Last Dose  . 0.9 %  sodium chloride infusion  250 mL Intravenous PRN Gladstone Lighter, MD      . acetaminophen (TYLENOL) tablet 650 mg  650 mg Oral Q6H PRN Gladstone Lighter, MD   650 mg at 11/08/17 2256   Or  . acetaminophen (TYLENOL) suppository 650 mg  650 mg Rectal Q6H PRN Gladstone Lighter, MD      . ALPRAZolam Duanne Moron) tablet 2 mg  2 mg Oral QHS Gladstone Lighter, MD   2 mg at 11/10/17 2153  . alum & mag hydroxide-simeth (MAALOX/MYLANTA) 200-200-20 MG/5ML suspension 30 mL  30 mL Oral Q6H PRN Gladstone Lighter, MD   30 mL at 11/08/17 2247  .  bisacodyl (DULCOLAX) suppository 10 mg  10 mg Rectal Daily PRN Gladstone Lighter, MD      . buPROPion (WELLBUTRIN XL) 24 hr tablet 300 mg  300 mg Oral Daily Gladstone Lighter, MD   300 mg at 11/11/17 0924  . diazepam (VALIUM) tablet 5 mg  5 mg Oral Q8H PRN Gladstone Lighter, MD   5 mg at 11/09/17 0100  . folic acid (FOLVITE) tablet 1 mg  1 mg Oral Daily Gladstone Lighter, MD   1 mg at 11/11/17 0924  . lactulose (CHRONULAC) 10 GM/15ML solution 30 g  30 g Oral TID Gladstone Lighter, MD   30 g at 11/11/17 0923  . lidocaine (LIDODERM) 5 % 1 patch  1 patch Transdermal Q24H Gladstone Lighter, MD   1 patch at 11/11/17 1055  . metroNIDAZOLE (FLAGYL) tablet 500 mg  500 mg Oral Q12H Gladstone Lighter, MD   500 mg at 11/11/17  0924  . ondansetron (ZOFRAN) tablet 4 mg  4 mg Oral Q6H PRN Gladstone Lighter, MD       Or  . ondansetron (ZOFRAN) injection 4 mg  4 mg Intravenous Q6H PRN Gladstone Lighter, MD      . oxyCODONE-acetaminophen (PERCOCET/ROXICET) 5-325 MG per tablet 1 tablet  1 tablet Oral Q6H PRN Gladstone Lighter, MD   1 tablet at 11/11/17 1502  . rifaximin (XIFAXAN) tablet 550 mg  550 mg Oral BID Gladstone Lighter, MD   550 mg at 11/11/17 0924  . sodium chloride flush (NS) 0.9 % injection 3 mL  3 mL Intravenous Q12H Gladstone Lighter, MD   3 mL at 11/11/17 0925  . sodium chloride flush (NS) 0.9 % injection 3 mL  3 mL Intravenous PRN Gladstone Lighter, MD        Musculoskeletal: Strength & Muscle Tone: decreased Gait & Station: unable to stand Patient leans: N/A  Psychiatric Specialty Exam: Physical Exam  Nursing note and vitals reviewed. Constitutional: She appears well-developed. She appears distressed.  HENT:  Head: Normocephalic and atraumatic.  Eyes: Pupils are equal, round, and reactive to light. Conjunctivae are normal.  Neck: Normal range of motion.  Cardiovascular: Regular rhythm and normal heart sounds.  Respiratory: Effort normal.  GI: Soft.  Musculoskeletal: Normal range of motion.  Neurological: She is alert.  Skin: Skin is warm and dry.     Psychiatric: Her affect is blunt. Her speech is delayed. She is slowed and withdrawn. Thought content is not paranoid. Cognition and memory are impaired. She expresses no homicidal and no suicidal ideation. She exhibits abnormal recent memory.    Review of Systems  Constitutional: Positive for malaise/fatigue.  HENT: Negative.   Eyes: Negative.   Respiratory: Negative.   Cardiovascular: Negative.   Gastrointestinal: Negative.   Musculoskeletal: Negative.   Skin: Negative.   Neurological: Positive for weakness.  Psychiatric/Behavioral: Positive for memory loss and substance abuse. Negative for depression, hallucinations and suicidal  ideas. The patient is not nervous/anxious and does not have insomnia.     Blood pressure (!) 92/45, pulse (!) 110, temperature 98.1 F (36.7 C), temperature source Oral, resp. rate 18, height '5\' 6"'  (1.676 m), weight 255 lb 11.7 oz (116 kg), SpO2 100 %.Body mass index is 41.28 kg/m.  General Appearance: Disheveled  Eye Contact:  Minimal  Speech:  Garbled and Slow  Volume:  Decreased  Mood:  Euthymic  Affect:  Constricted  Thought Process:  Disorganized  Orientation:  Other:  Basically organized to situation but gets the details wrong  Thought Content:  Rumination  and Tangential  Suicidal Thoughts:  No  Homicidal Thoughts:  No  Memory:  Immediate;   Fair Recent;   Poor Remote;   Fair  Judgement:  Impaired  Insight:  Shallow  Psychomotor Activity:  Decreased  Concentration:  Concentration: Poor  Recall:  Poor  Fund of Knowledge:  Fair  Language:  Fair  Akathisia:  No  Handed:  Right  AIMS (if indicated):     Assets:  Desire for Improvement Housing  ADL's:  Impaired  Cognition:  Impaired,  Mild and Moderate  Sleep:        Treatment Plan Summary: Daily contact with patient to assess and evaluate symptoms and progress in treatment, Medication management and Plan 42 year old woman with a history of anxiety depression and substance abuse.  On my examination today I would say she is a little bit delirious.  She waxed and waned and her mental state falling in and out of sleep and confusion although she did not actually seem to be having any hallucinations and no bizarre or dangerous behavior.  Patient has an ammonia level still over 100 and is quite anemic.  Additionally no alcohol or drug level was done on admission so I do not think we know quite how much she had been drinking.  It is possible that she could be having alcohol withdrawal although it does not really look like DTs.  I think as far as her psychiatric medicine it is fine to continue the Wellbutrin.  Right now she is being  given Xanax 2 mg at night only.  I think that is probably reasonable.  She does not look like she needs any extra sedating medicine during the day.  When people are on doses as high as 2 mg 3 times a day I think we can always wonder if there is diversion anyway so it can be risky to put them back on the full dose in the hospital.  Right now I would not change anything about it.  I will follow up as needed.  Disposition: No evidence of imminent risk to self or others at present.   Patient does not meet criteria for psychiatric inpatient admission. Supportive therapy provided about ongoing stressors.  Alethia Berthold, MD 11/11/2017 4:11 PM

## 2017-11-11 NOTE — Progress Notes (Signed)
[employer] [facility] [place] at [facility]   PATIENT NAME: Brandy Cooper    MR#:  [id number]  DATE OF BIRTH:  29-Oct-1975  SUBJECTIVE:  CHIEF COMPLAINT:   Chief Complaint  Patient presents with  . Vaginal Bleeding  per social worker patient at times agitated and throwing things in room, trying to pull IV, feces on the floor/computer.. Requested psych eval - ammonia still elevated but per nursing note, patient refused lactulose. When I saw her she just came out bathroom complaining she is going to bathroom a lot and tired. REVIEW OF SYSTEMS:  Review of Systems  Constitutional: Positive for malaise/fatigue. Negative for chills and fever.  HENT: Negative for congestion, ear discharge, hearing loss and nosebleeds.   Eyes: Negative for blurred vision and double vision.  Respiratory: Negative for cough, shortness of breath and wheezing.   Cardiovascular: Negative for chest pain and palpitations.  Gastrointestinal: Negative for abdominal pain, constipation, diarrhea, nausea and vomiting.  Genitourinary: Negative for dysuria.  Musculoskeletal: Positive for back pain and myalgias.  Neurological: Positive for weakness. Negative for dizziness, speech change, focal weakness, seizures and headaches.   DRUG ALLERGIES:   Allergies  Allergen Reactions  . Celebrex [Celecoxib] Hives  . Morphine And Related Itching    VITALS:  Blood pressure 93/76, pulse (!) 114, temperature 98.1 F (36.7 C), temperature source Oral, resp. rate 20, height 5\' 6"  (1.676 m), weight 116 kg (255 lb 11.7 oz), SpO2 96 %.  PHYSICAL EXAMINATION:  Physical Exam  GENERAL:  42 y.o.-year-old patient lying in the bed with no acute distress.  EYES: Pupils equal, round, reactive to light and accommodation. No scleral icterus. Extraocular muscles intact. Icteric sclerae HEENT: Head atraumatic, normocephalic. Oropharynx and nasopharynx clear.  NECK:  Supple, no jugular venous distention. No thyroid enlargement,  no tenderness.  LUNGS: Normal breath sounds bilaterally, no wheezing, rales,rhonchi or crepitation. No use of accessory muscles of respiration. Decreased bibasilar breath sounds CARDIOVASCULAR: S1, S2 normal. No murmurs, rubs, or gallops.  ABDOMEN: Soft, obese, nontender, nondistended. Bowel sounds present. No organomegaly or mass.  EXTREMITIES: No pedal edema, cyanosis, or clubbing.  NEUROLOGIC: Cranial nerves II through XII are intact. Muscle strength 5/5 in all extremities. Sensation intact. Gait not checked.  Global weakness noted PSYCHIATRIC: The patient is alert and oriented x 3 SKIN: No obvious rash, lesion, or ulcer.    LABORATORY PANEL:   CBC Recent Labs  Lab 11/11/17 0507  WBC 3.2*  HGB 9.6*  HCT 27.9*  PLT 74*   ------------------------------------------------------------------------------------------------------------------  Chemistries  Recent Labs  Lab 11/10/17 0506 11/11/17 0507  NA 132* 134*  K 4.3 4.1  CL 104 106  CO2 21* 22  GLUCOSE 116* 113*  BUN 20 21*  CREATININE 1.52* 1.22*  CALCIUM 8.8* 8.6*  AST 84*  --   ALT 23  --   ALKPHOS 72  --   BILITOT 7.9*  --    ------------------------------------------------------------------------------------------------------------------  Cardiac Enzymes No results for input(s): TROPONINI in the last 168 hours. ------------------------------------------------------------------------------------------------------------------  RADIOLOGY:  Dg Lumbar Spine 2-3 Views  Result Date: 11/10/2017 CLINICAL DATA:  Low back pain for 2 years, recent fall with increased pain, initial encounter EXAM: LUMBAR SPINE - 3 VIEW COMPARISON:  None. FINDINGS: Five lumbar type vertebral bodies are well visualized. Disc space narrowing is noted at L3-4, L4-5 and L5-S1. Mild osteophytic changes are seen. No anterolisthesis is noted. No soft tissue changes are noted. IMPRESSION: Degenerative change without acute abnormality. Electronically  Signed   By:  Alcide CleverMark  Lukens M.D.   On: 11/10/2017 08:30   Dg Abd 1 View  Result Date: 11/10/2017 CLINICAL DATA:  Constipation for 2-3 days, history of cirrhosis EXAM: ABDOMEN - 1 VIEW COMPARISON:  Lumbar spine films of 11/10/2017 FINDINGS: Supine views of the abdomen show no evidence of bowel obstruction. Surgical clips are present in the right upper quadrant from prior cholecystectomy and sutures are noted in the left upper quadrant possibly due to prior gastric surgery. IUD is noted in the mid pelvis. No opaque calculi are seen. There are degenerative changes in the lower lumbar spine. IMPRESSION: 1. No bowel obstruction. 2. No opaque calculi. 3. Degenerative change in the mid to lower lumbar spine Electronically Signed   By: Dwyane DeePaul  Barry M.D.   On: 11/10/2017 11:57    EKG:   Orders placed or performed during the hospital encounter of 2018/05/17  . ED EKG  . ED EKG  . EKG 12-Lead  . EKG 12-Lead  . EKG    ASSESSMENT AND PLAN:   Brandy Cooper  is a 42 y.o. female with a known history of anxiety and depression, status post laparoscopic gastric sleeve resection, dysfunctional uterine bleeding presents to hospital secondary to a fall.  1. 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 -continue lactulose and xifaxan - refused lactulose earlier today per nursing note - ammonia still elevated at 109 (was 139),  - counseled patient importance of lactulose and she is agreed to take it now - GI following - EGD as outpt - BP is low to add any lasix or aldactone  2.  Acute renal failure- initially thought prerenal, however her BUN is not significantly increased. - could be hepatorenal, with new diagnosis of cirrhosis -continue lasix and aldactone once BP improves -Hold metformin and other nephrotoxins  3.  Dysfunctional uterine bleeding-going on for a few months, had a left ovarian cyst based on pelvic ultrasound 2 months ago. -Missed  recent follow-up visits and increased bleeding with worsening anemia. -Appreciate OB/GYN consult-they have recommended outpatient follow-up since no active bleeding at this time  4.  Depression/anxiety/subs abuse-appreciate psych input -? delirium.   - continue the Wellbutrin & Xanax.     5. Bacterial vaginosis- flagyl 500mg  bid for 7 days  6.  Encourage ambulation  7.  DVT prophylaxis-teds and SCDs.  Hold off on heparin products due to bleeding      All the records are reviewed and case discussed with Care Management/Social Workerr. Management plans discussed with the patient, nursing and they are in agreement.  CODE STATUS: Full Code  TOTAL TIME TAKING CARE OF THIS PATIENT: 28 minutes.   POSSIBLE D/C IN 1-2 DAYS, DEPENDING ON CLINICAL CONDITION.   Delfino LovettVipul Tyla Burgner M.D on 11/11/2017 at 10:20 PM  Between 7am to 6pm - Pager - [phone]  After 6pm go to [email or web address] - password [email or web address]  [facility] [place] [facility]  Office  [phone]  CC: Primary care physician; Dortha KernBliss, Laura K, MD

## 2017-11-11 NOTE — Progress Notes (Signed)
Patient refused her lactulose. Per patient, she has been having bowel movement and don't see the need to take her med. Patient advised on the implications for her not taken her medication and she agreed.

## 2017-11-12 DIAGNOSIS — K7010 Alcoholic hepatitis without ascites: Secondary | ICD-10-CM

## 2017-11-12 LAB — CBC
HEMATOCRIT: 27.2 % — AB (ref 35.0–47.0)
Hemoglobin: 9.4 g/dL — ABNORMAL LOW (ref 12.0–16.0)
MCH: 37.1 pg — ABNORMAL HIGH (ref 26.0–34.0)
MCHC: 34.4 g/dL (ref 32.0–36.0)
MCV: 107.9 fL — AB (ref 80.0–100.0)
Platelets: 72 10*3/uL — ABNORMAL LOW (ref 150–440)
RBC: 2.52 MIL/uL — AB (ref 3.80–5.20)
RDW: 23.9 % — ABNORMAL HIGH (ref 11.5–14.5)
WBC: 3.7 10*3/uL (ref 3.6–11.0)

## 2017-11-12 LAB — COMPREHENSIVE METABOLIC PANEL
ALBUMIN: 2.3 g/dL — AB (ref 3.5–5.0)
ALT: 21 U/L (ref 14–54)
AST: 70 U/L — AB (ref 15–41)
Alkaline Phosphatase: 60 U/L (ref 38–126)
Anion gap: 6 (ref 5–15)
BILIRUBIN TOTAL: 8.6 mg/dL — AB (ref 0.3–1.2)
BUN: 22 mg/dL — AB (ref 6–20)
CHLORIDE: 110 mmol/L (ref 101–111)
CO2: 20 mmol/L — ABNORMAL LOW (ref 22–32)
Calcium: 8.5 mg/dL — ABNORMAL LOW (ref 8.9–10.3)
Creatinine, Ser: 1.3 mg/dL — ABNORMAL HIGH (ref 0.44–1.00)
GFR calc Af Amer: 58 mL/min — ABNORMAL LOW (ref >60)
GFR calc non Af Amer: 50 mL/min — ABNORMAL LOW (ref >60)
GLUCOSE: 103 mg/dL — AB (ref 65–99)
POTASSIUM: 4 mmol/L (ref 3.5–5.1)
Sodium: 136 mmol/L (ref 135–145)
TOTAL PROTEIN: 5.8 g/dL — AB (ref 6.5–8.1)

## 2017-11-12 LAB — HEPATITIS A ANTIBODY, IGM: Hep A IgM: NEGATIVE

## 2017-11-12 LAB — ANA: ANA: NEGATIVE

## 2017-11-12 LAB — HEPATITIS B DNA, ULTRAQUANTITATIVE, PCR
HBV DNA SERPL PCR-ACNC: NOT DETECTED IU/mL
HBV DNA SERPL PCR-LOG IU: UNDETERMINED log10 IU/mL

## 2017-11-12 LAB — CMV IGM: CMV IgM: 114 AU/mL — ABNORMAL HIGH (ref 0.0–29.9)

## 2017-11-12 LAB — PROTIME-INR
INR: 3.22
Prothrombin Time: 32.7 seconds — ABNORMAL HIGH (ref 11.4–15.2)

## 2017-11-12 LAB — HEPATITIS B CORE ANTIBODY, TOTAL: Hep B Core Total Ab: NEGATIVE

## 2017-11-12 LAB — HEPATITIS B SURFACE ANTIGEN: HEP B S AG: NEGATIVE

## 2017-11-12 LAB — ANTI-MICROSOMAL ANTIBODY LIVER / KIDNEY: LKM1 Ab: 0.4 Units (ref 0.0–20.0)

## 2017-11-12 LAB — HSV(HERPES SIMPLEX VRS) I + II AB-IGM: HSVI/II COMB AB IGM: 1.88 ratio — AB (ref 0.00–0.90)

## 2017-11-12 LAB — HEPATITIS C ANTIBODY (REFLEX)

## 2017-11-12 LAB — AMMONIA: Ammonia: 107 umol/L — ABNORMAL HIGH (ref 9–35)

## 2017-11-12 LAB — CERULOPLASMIN: CERULOPLASMIN: 11.2 mg/dL — AB (ref 19.0–39.0)

## 2017-11-12 LAB — HCV COMMENT:

## 2017-11-12 MED ORDER — SODIUM CHLORIDE 0.9 % IV BOLUS
500.0000 mL | Freq: Once | INTRAVENOUS | Status: AC
  Administered 2017-11-12: 500 mL via INTRAVENOUS

## 2017-11-12 MED ORDER — PREDNISONE 50 MG PO TABS
50.0000 mg | ORAL_TABLET | Freq: Every day | ORAL | Status: DC
  Administered 2017-11-12: 50 mg via ORAL
  Filled 2017-11-12: qty 1

## 2017-11-12 MED ORDER — BACITRACIN-NEOMYCIN-POLYMYXIN OINTMENT TUBE
TOPICAL_OINTMENT | Freq: Three times a day (TID) | CUTANEOUS | Status: DC
  Administered 2017-11-12 – 2017-11-20 (×23): via TOPICAL
  Administered 2017-11-21: 1 via TOPICAL
  Administered 2017-11-21 (×2): via TOPICAL
  Filled 2017-11-12: qty 14.17

## 2017-11-12 NOTE — Progress Notes (Signed)
[employer] - [place] [facility]   PATIENT NAME: Brandy Cooper    MR#:  [id number]  DATE OF BIRTH:  23-Sep-1975  SUBJECTIVE:  CHIEF COMPLAINT:   Chief Complaint  Patient presents with  . Vaginal Bleeding  per social worker patient at times agitated and throwing things in room, trying to pull IV, feces on the floor/computer.. Seen by psych  - pt reports weakness and back pain,reports family doesn't want her to come home until she is completely back to her norm REVIEW OF SYSTEMS:  Review of Systems  Constitutional: Positive for malaise/fatigue. Negative for chills and fever.  HENT: Negative for congestion, ear discharge, hearing loss and nosebleeds.   Eyes: Negative for blurred vision and double vision.  Respiratory: Negative for cough, shortness of breath and wheezing.   Cardiovascular: Negative for chest pain and palpitations.  Gastrointestinal: Negative for abdominal pain, constipation, diarrhea, nausea and vomiting.  Genitourinary: Negative for dysuria.  Musculoskeletal: Positive for back pain and myalgias.  Neurological: Positive for weakness. Negative for dizziness, speech change, focal weakness, seizures and headaches.   DRUG ALLERGIES:   Allergies  Allergen Reactions  . Celebrex [Celecoxib] Hives  . Morphine And Related Itching    VITALS:  Blood pressure 92/73, pulse (!) 115, temperature 98 F (36.7 C), temperature source Oral, resp. rate 19, height 5\' 6"  (1.676 m), weight 116 kg (255 lb 11.7 oz), SpO2 99 %.  PHYSICAL EXAMINATION:  Physical Exam  GENERAL:  42 y.o.-year-old patient lying in the bed with no acute distress.  EYES: Pupils equal, round, reactive to light and accommodation. No scleral icterus. Extraocular muscles intact. Icteric sclerae HEENT: Head atraumatic, normocephalic. Oropharynx and nasopharynx clear.  NECK:  Supple, no jugular venous distention. No thyroid enlargement, no tenderness.  LUNGS: Normal breath sounds bilaterally,  no wheezing, rales,rhonchi or crepitation. No use of accessory muscles of respiration. Decreased bibasilar breath sounds CARDIOVASCULAR: S1, S2 normal. No murmurs, rubs, or gallops.  ABDOMEN: Soft, obese, nontender, nondistended. Bowel sounds present. No organomegaly or mass.  EXTREMITIES: No pedal edema, cyanosis, or clubbing.  NEUROLOGIC: Cranial nerves II through XII are intact. Muscle strength 5/5 in all extremities. Sensation intact. Gait not checked.  Global weakness noted PSYCHIATRIC: The patient is alert and oriented x 3 SKIN: No obvious rash, lesion, or ulcer.    LABORATORY PANEL:   CBC Recent Labs  Lab 11/12/17 0459  WBC 3.7  HGB 9.4*  HCT 27.2*  PLT 72*   ------------------------------------------------------------------------------------------------------------------  Chemistries  Recent Labs  Lab 11/12/17 0459  NA 136  K 4.0  CL 110  CO2 20*  GLUCOSE 103*  BUN 22*  CREATININE 1.30*  CALCIUM 8.5*  AST 70*  ALT 21  ALKPHOS 60  BILITOT 8.6*   ------------------------------------------------------------------------------------------------------------------  Cardiac Enzymes No results for input(s): TROPONINI in the last 168 hours. ------------------------------------------------------------------------------------------------------------------  RADIOLOGY:  No results found.  EKG:   Orders placed or performed during the hospital encounter of 11/23/2017  . ED EKG  . ED EKG  . EKG 12-Lead  . EKG 12-Lead  . EKG    ASSESSMENT AND PLAN:   Brandy Bossngela Ates  is a 42 y.o. female with a known history of anxiety and depression, status post laparoscopic gastric sleeve resection, dysfunctional uterine bleeding presents to hospital secondary to a fall.  #. 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 -continue lactulose and xifaxan - refused lactulose earlier today per nursing note - ammonia still  elevated  at 107(was 139-109), - GI following - EGD as outpt - BP is low to add any lasix or aldactone  2.  Acute renal failure- initially thought prerenal, however her BUN is not significantly increased. - could be hepatorenal, with new diagnosis of cirrhosis -continue lasix and aldactone once BP improves -Hold metformin and other nephrotoxins  3. IgM CMV positive CMV PCR sent ID recommended steroids for alcoholic hepatitis  4.  Depression/anxiety/subs abuse-appreciate psych input -? delirium.   - continue the Wellbutrin & Xanax.     5. Bacterial vaginosis- flagyl 500mg  bid for 7 days  6.  Dysfunctional uterine bleeding-going on for a few months, had a left ovarian cyst based on pelvic ultrasound 2 months ago. -Missed recent follow-up visits and increased bleeding with worsening anemia. -Appreciate OB/GYN consult-they have recommended outpatient follow-up since no active bleeding at this time  7.  DVT prophylaxis-teds and SCDs.  Hold off on heparin products due to bleeding      All the records are reviewed and case discussed with Care Management/Social Workerr. Management plans discussed with the patient, nursing and they are in agreement. Call placed and left VM tp parents to call back  CODE STATUS: Full Code  TOTAL TIME TAKING CARE OF THIS PATIENT: 28 minutes.   POSSIBLE D/C IN 1-2 DAYS, DEPENDING ON CLINICAL CONDITION.   Ramonita Lab M.D on 11/12/2017 at 9:15 PM  Between 7am to 6pm - Pager - [phone]  After 6pm go to [email or web address] - password [email or web address]  [facility] [place] [facility]  Office  [phone]  CC: Primary care physician; Dortha Kern, MD

## 2017-11-12 NOTE — Progress Notes (Addendum)
Brandy Antigua, MD [address] [place], [place], [place], [address] [address], [place], [place], [place], [address] Phone: [phone]  Fax: [phone]   Subjective:  Mother at bedside today. Pt. Tolerating PO diet without difficulty. Jaundiced. No abdominal pain. No signs of GI bleeding  Objective: Exam: Vital signs in last 24 hours: Vitals:   11/11/17 2019 11/11/17 2159 11/12/17 0439 11/12/17 1100  BP: (!) 96/56 93/76 95/60 92/73  Pulse: (!) 114 (!) 114 (!) 118 (!) 115  Resp: _0 Temp: 98.1 F (36.7 C)  98.6 F (37 C) 98 F (36.7 C)  TempSrc: Oral  Oral Oral  SpO2: 98% 96% 100% 99%  Weight:      Height:       Weight change:   Intake/Output Summary (Last 24 hours) at 11/12/2017 1303 Last data filed at 11/11/2017 2140 Gross per 24 hour  Intake 3 ml  Output -  Net 3 ml    General: No acute distress, AAO x3 Abd: Soft, NT/ND, No HSM Skin: Warm, no rashes, jaudiced Neck: Supple, Trachea midline   Lab Results: Lab Results  Component Value Date   WBC 3.7 11/12/2017   HGB 9.4 (L) 11/12/2017   HCT 27.2 (L) 11/12/2017   MCV 107.9 (H) 11/12/2017   PLT 72 (L) 11/12/2017   Micro Results: Recent Results (from the past 240 hour(s))  Wet prep, genital     Status: Abnormal   Collection Time: 11/14/2017  8:31 AM  Result Value Ref Range Status   Yeast Wet Prep HPF POC NONE SEEN NONE SEEN Final   Trich, Wet Prep NONE SEEN NONE SEEN Final   Clue Cells Wet Prep HPF POC PRESENT (A) NONE SEEN Final   WBC, Wet Prep HPF POC MODERATE (A) NONE SEEN Final   Sperm NONE SEEN  Final    Comment: Performed at [facility], [address] [place], [place] [address]  [place] [facility] only)     Status: None   Collection Time: 11/23/2017  8:31 AM  Result Value Ref Range Status   Specimen source GC/Chlam CHLAMYDIA SPECIES  Final   Chlamydia Tr NOT DETECTED NOT DETECTED Final   N gonorrhoeae NOT DETECTED NOT DETECTED Final    Comment: (NOTE) 100   This methodology has not been evaluated in pregnant women or in 200  patients with a history of hysterectomy. 300 400  This methodology will not be performed on patients less than 26  years of age. Performed at [facility], [address] [place], [place] [address]    Studies/Results: No results found. Medications:  Scheduled Meds: . ALPRAZolam  2 mg Oral QHS  . buPROPion  300 mg Oral Daily  . folic acid  1 mg Oral Daily  . lactulose  30 g Oral TID  . lidocaine  1 patch Transdermal Q24H  . metroNIDAZOLE  500 mg Oral Q12H  . neomycin-bacitracin-polymyxin   Topical TID  . rifaximin  550 mg Oral BID  . sodium chloride flush  3 mL Intravenous Q12H   Continuous Infusions: . sodium chloride     PRN Meds:.sodium chloride, acetaminophen **OR** acetaminophen, alum & mag hydroxide-simeth, bisacodyl, diazepam, ondansetron **OR** ondansetron (ZOFRAN) IV, oxyCODONE-acetaminophen, sodium chloride flush   Assessment: Elevated liver enzymes   Plan:  Patient's work-up so far, has a revealed an elevated ferritin, which is likely elevated due to it being an acute phase reactant Ceruloplasmin is low, however, low ceruloplasmin by itself is  not diagnostic of Wilson's disease.  Her transaminases, showed normal ALT.  Mildly elevated AST.  This is not consistent with acute hepatitis from Wilson's disease.  Hepatitis A, B, C testing is negative. CMV IgM is positive.  However, CMV hepatitis causes elevation of transaminases, and this patient has much elevated total bilirubin.  Her elevated bilirubin, is likely due to alcoholic hepatitis Her normal alk phos, and MRCP, are both consistent with no biliary obstruction Her discriminant function is high at 89  Would recommend infectious disease consult, to evaluate if CMV IgM treatment is indicated at this time.  And if it is okay to start steroids for alcoholic hepatitis.  If okay with infectious disease, start prednisolone 40 mg/day.  If  steroids cannot be started, pentoxifylline can be considered.   LOS: 6 days   Brandy Antigua, MD 11/12/2017, 1:03 PM

## 2017-11-12 NOTE — Progress Notes (Signed)
Dr. Anne HahnWillis notified of low BP; acknowledged; will give small bolus and followup. Windy Carinaurner,Kelvin Sennett K, RN 11:24 PM 11/12/2017

## 2017-11-12 NOTE — Consult Note (Signed)
[facility] Consult   Reason for Consult: Follow-up consult 42 year old woman with a history of substance abuse in the hospital with confusion and bleeding from cirrhosis Referring Physician: Gouru Patient Identification: SHAMIR SEDLAR MRN:  [id number] Principal Diagnosis: Subacute delirium Diagnosis:   Patient Active Problem List   Diagnosis Date Noted  . Subacute delirium [F05] 11/11/2017  . ARF (acute renal failure) ([place]) [N17.9] 11/05/2017  . Substance induced mood disorder ([place]) [F19.94] 04/04/2015  . Alcohol abuse [F10.10] 04/04/2015  . Benzodiazepine overdose [T42.4X1A] 04/04/2015    Total Time spent with patient: 30 minutes  Subjective:   SUETTA HOFFMEISTER is a 42 y.o. female patient admitted with "I do not feel good".  HPI: Patient seen chart reviewed.  See previous notes.  42 year old woman with a history of alcohol and other drug abuse who is in the hospital now with bleeding anemia and cirrhosis.  Reviewed chart notes.  Hospitalist note reports that the patient had been agitated and throwing things around the room.  More recent nursing notes sound like she has been a little calmer.  Patient tells me she is feeling uncomfortable and is clearly grumpy but does not make any threats.  She is alert and oriented to her situation and illness.  I asked her about throwing things around the room and she admitted that it was true when she first came into the hospital because she said the staff was treating her badly.  Patient makes some more judgmental and irritated comments towards the staff in general but denies any current thoughts of being violent or agitated.  Patient is not showing any current signs of delirium.  Past Psychiatric History: Long-standing history of alcohol abuse  Risk to Self: Is patient at risk for suicide?: No Risk to Others:   Prior Inpatient Therapy:   Prior Outpatient Therapy:    Past Medical History:  Past Medical History:  Diagnosis Date  .  Anxiety   . Bipolar affect, depressed ([place])   . Depression     Past Surgical History:  Procedure Laterality Date  . CHOLECYSTECTOMY    . LAPAROSCOPIC GASTRIC SLEEVE RESECTION    . TONSILLECTOMY     Family History:  Family History  Family history unknown: Yes   Family Psychiatric  History: Substance abuse Social History:  Social History   Substance and Sexual Activity  Alcohol Use No     Social History   Substance and Sexual Activity  Drug Use No    Social History   Socioeconomic History  . Marital status: Legally Separated    Spouse name: Not on file  . Number of children: Not on file  . Years of education: Not on file  . Highest education level: Not on file  Occupational History  . Not on file  Social Needs  . Financial resource strain: Not on file  . Food insecurity:    Worry: Not on file    Inability: Not on file  . Transportation needs:    Medical: Not on file    Non-medical: Not on file  Tobacco Use  . Smoking status: Former Smoker    Types: Cigarettes  . Smokeless tobacco: Never Used  . Tobacco comment: quit since 2 months  Substance and Sexual Activity  . Alcohol use: No  . Drug use: No  . Sexual activity: Not on file  Lifestyle  . Physical activity:    Days per week: Not on file    Minutes per session: Not on file  .  Stress: Not on file  Relationships  . Social connections:    Talks on phone: Not on file    Gets together: Not on file    Attends religious service: Not on file    Active member of club or organization: Not on file    Attends meetings of clubs or organizations: Not on file    Relationship status: Not on file  Other Topics Concern  . Not on file  Social History Narrative   Lives by herself, unsteady gait, ambulates without any assisted device   Additional Social History:    Allergies:   Allergies  Allergen Reactions  . Celebrex [Celecoxib] Hives  . Morphine And Related Itching    Labs:  Results for orders placed or  performed during the hospital encounter of 11/19/2017 (from the past 48 hour(s))  Ammonia     Status: Abnormal   Collection Time: 11/11/17  5:07 AM  Result Value Ref Range   Ammonia 109 (H) 9 - 35 umol/L    Comment: Performed at [facility], [address] [place], [place] [address]  Basic metabolic panel     Status: Abnormal   Collection Time: 11/11/17  5:07 AM  Result Value Ref Range   Sodium 134 (L) 135 - 145 mmol/L   Potassium 4.1 3.5 - 5.1 mmol/L   Chloride 106 101 - 111 mmol/L   CO2 22 22 - 32 mmol/L   Glucose, Bld 113 (H) 65 - 99 mg/dL   BUN 21 (H) 6 - 20 mg/dL   Creatinine, Ser 1.22 (H) 0.44 - 1.00 mg/dL   Calcium 8.6 (L) 8.9 - 10.3 mg/dL   GFR calc non Af Amer 54 (L) >60 mL/min   GFR calc Af Amer >60 >60 mL/min    Comment: (NOTE) The eGFR has been calculated using the CKD EPI equation. This calculation has not been validated in all clinical situations. eGFR's persistently <60 mL/min signify possible Chronic Kidney Disease.    Anion gap 6 5 - 15    Comment: Performed at [facility], [address] [place], [place] [address]  CBC     Status: Abnormal   Collection Time: 11/11/17  5:07 AM  Result Value Ref Range   WBC 3.2 (L) 3.6 - 11.0 K/uL   RBC 2.60 (L) 3.80 - 5.20 MIL/uL   Hemoglobin 9.6 (L) 12.0 - 16.0 g/dL   HCT 27.9 (L) 35.0 - 47.0 %   MCV 107.0 (H) 80.0 - 100.0 fL   MCH 37.0 (H) 26.0 - 34.0 pg   MCHC 34.5 32.0 - 36.0 g/dL   RDW 24.5 (H) 11.5 - 14.5 %   Platelets 74 (L) 150 - 440 K/uL    Comment: Performed at [facility], [address] [place], [place] [address]  Hepatitis A antibody, IgM     Status: None   Collection Time: 11/11/17  5:07 AM  Result Value Ref Range   Hep A IgM Negative Negative    Comment: (NOTE) Performed At: [facility] [address] [place], [place] [id number] Nagendra Sanjai MD Ph:[id number] Performed at [facility], [address] [place], [place] [address]   Hepatitis B core  antibody, total     Status: None   Collection Time: 11/11/17  5:07 AM  Result Value Ref Range   Hep B Core Total Ab Negative Negative    Comment: (NOTE) Performed At: [facility] [address] [place], [place] [id number] Nagendra Sanjai MD Ph:[id number] Performed at    [facility], [address] [place], [place] [address]   Hepatitis B surface antigen     Status: None   Collection Time: 11/11/17  5:07 AM  Result Value Ref Range   Hepatitis B Surface Ag Negative Negative    Comment: (NOTE) Performed At: [facility] [place], [place] [id number] Rush Farmer MD [id number] Performed at [facility], [address] [place], [place] [address]   Hepatitis c antibody (reflex)     Status: None   Collection Time: 11/11/17  5:07 AM  Result Value Ref Range   HCV Ab <0.1 0.0 - 0.9 s/co ratio    Comment: (NOTE) Performed At: [facility] [place], [place] [id number] Rush Farmer MD [id number] Performed at [facility], [address] [place], [place] [address]   ANA     Status: None   Collection Time: 11/11/17  5:07 AM  Result Value Ref Range   Anit Nuclear Antibody(ANA) Negative Negative    Comment: (NOTE) Performed At: [facility] [place], [place] [id number] Rush Farmer MD [id number] Performed at [facility], [address] [place], [place] [address]   Ceruloplasmin     Status: Abnormal   Collection Time: 11/11/17  5:07 AM  Result Value Ref Range   Ceruloplasmin 11.2 (L) 19.0 - 39.0 mg/dL    Comment: (NOTE) Performed At: [facility] [place], [place] [id number] Rush Farmer MD [id number] Performed at [facility], [address] [place], [place] [address]   Ferritin     Status: Abnormal   Collection Time: 11/11/17  5:07 AM  Result Value Ref Range   Ferritin 1,050 (H) 11 - 307 ng/mL    Comment:  Performed at [facility], [address] [place], [place] [address]  CMV IgM     Status: Abnormal   Collection Time: 11/11/17  5:07 AM  Result Value Ref Range   CMV IgM 114.0 (H) 0.0 - 29.9 AU/mL    Comment: (NOTE)                                Negative         <30.0                                Equivocal  30.0 - 34.9                                Positive         >34.9 A positive result is generally indicative of acute infection, reactivation or persistent IgM production. Performed At: [facility] [place], [place] [id number] Rush Farmer MD [id number] Performed at [facility], [address] [place], [place] [address]   HCV Comment:     Status: None   Collection Time: 11/11/17  5:07 AM  Result Value Ref Range   Comment: Comment     Comment: (NOTE) Non reactive HCV antibody screen is consistent with no HCV infection, unless recent infection is suspected or other evidence exists to indicate HCV infection. Performed At: [facility] [place], [place] [id number] Rush Farmer MD [id number] Performed at [facility], [address], [place], [place] [address]   Comprehensive metabolic panel  Status: Abnormal   Collection Time: 11/12/17  4:59 AM  Result Value Ref Range   Sodium 136 135 - 145 mmol/L   Potassium 4.0 3.5 - 5.1 mmol/L   Chloride 110 101 - 111 mmol/L   CO2 20 (L) 22 - 32 mmol/L   Glucose, Bld 103 (H) 65 - 99 mg/dL   BUN 22 (H) 6 - 20 mg/dL   Creatinine, Ser 1.30 (H) 0.44 - 1.00 mg/dL   Calcium 8.5 (L) 8.9 - 10.3 mg/dL   Total Protein 5.8 (L) 6.5 - 8.1 g/dL   Albumin 2.3 (L) 3.5 - 5.0 g/dL   AST 70 (H) 15 - 41 U/L   ALT 21 14 - 54 U/L   Alkaline Phosphatase 60 38 - 126 U/L   Total Bilirubin 8.6 (H) 0.3 - 1.2 mg/dL   GFR calc non Af Amer 50 (L) >60 mL/min   GFR calc Af Amer 58 (L) >60 mL/min    Comment: (NOTE) The eGFR has been calculated using the CKD EPI  equation. This calculation has not been validated in all clinical situations. eGFR's persistently <60 mL/min signify possible Chronic Kidney Disease.    Anion gap 6 5 - 15    Comment: Performed at [facility], [address] [place], [place] [address]  Protime-INR     Status: Abnormal   Collection Time: 11/12/17  4:59 AM  Result Value Ref Range   Prothrombin Time 32.7 (H) 11.4 - 15.2 seconds   INR 3.22     Comment: Performed at [facility], [address] [place], [place] [address]  Ammonia     Status: Abnormal   Collection Time: 11/12/17  4:59 AM  Result Value Ref Range   Ammonia 107 (H) 9 - 35 umol/L    Comment: Performed at [facility], [address] [place], [place] [address]  CBC     Status: Abnormal   Collection Time: 11/12/17  4:59 AM  Result Value Ref Range   WBC 3.7 3.6 - 11.0 K/uL   RBC 2.52 (L) 3.80 - 5.20 MIL/uL   Hemoglobin 9.4 (L) 12.0 - 16.0 g/dL   HCT 27.2 (L) 35.0 - 47.0 %   MCV 107.9 (H) 80.0 - 100.0 fL   MCH 37.1 (H) 26.0 - 34.0 pg   MCHC 34.4 32.0 - 36.0 g/dL   RDW 23.9 (H) 11.5 - 14.5 %   Platelets 72 (L) 150 - 440 K/uL    Comment: Performed at [facility], [address], [place], [place] [address]    Current Facility-Administered Medications  Medication Dose Route Frequency Provider Last Rate Last Dose  . 0.9 %  sodium chloride infusion  250 mL Intravenous PRN Gladstone Lighter, MD      . acetaminophen (TYLENOL) tablet 650 mg  650 mg Oral Q6H PRN Gladstone Lighter, MD   650 mg at 11/12/17 1446   Or  . acetaminophen (TYLENOL) suppository 650 mg  650 mg Rectal Q6H PRN Gladstone Lighter, MD      . ALPRAZolam Duanne Moron) tablet 2 mg  2 mg Oral QHS Gladstone Lighter, MD   2 mg at 11/11/17 2141  . alum & mag hydroxide-simeth (MAALOX/MYLANTA) 200-200-20 MG/5ML suspension 30 mL  30 mL Oral Q6H PRN Gladstone Lighter, MD   30 mL at 11/08/17 2247  . bisacodyl (DULCOLAX) suppository 10 mg  10 mg Rectal Daily PRN  Gladstone Lighter, MD      . buPROPion (WELLBUTRIN XL) 24 hr tablet 300 mg  300 mg Oral Daily Gladstone Lighter, MD   300 mg at 11/12/17 0934  . diazepam (VALIUM) tablet 5 mg  5 mg Oral Q8H PRN Gladstone Lighter, MD   5 mg at 11/09/17 0100  . folic acid (FOLVITE) tablet 1 mg  1 mg Oral Daily Gladstone Lighter, MD   1 mg at 11/12/17 0935  . lactulose (CHRONULAC) 10 GM/15ML solution 30 g  30 g Oral TID Gladstone Lighter, MD   30 g at 11/12/17 1445  . lidocaine (LIDODERM) 5 % 1 patch  1 patch Transdermal Q24H Gladstone Lighter, MD   1 patch at 11/12/17 1125  . metroNIDAZOLE (FLAGYL) tablet 500 mg  500 mg Oral Q12H Gladstone Lighter, MD   500 mg at 11/12/17 0935  . neomycin-bacitracin-polymyxin (NEOSPORIN) ointment   Topical TID Gouru, Aruna, MD      . ondansetron (ZOFRAN) tablet 4 mg  4 mg Oral Q6H PRN Gladstone Lighter, MD       Or  . ondansetron (ZOFRAN) injection 4 mg  4 mg Intravenous Q6H PRN Gladstone Lighter, MD      . oxyCODONE-acetaminophen (PERCOCET/ROXICET) 5-325 MG per tablet 1 tablet  1 tablet Oral Q6H PRN Gladstone Lighter, MD   1 tablet at 11/11/17 2245  . rifaximin (XIFAXAN) tablet 550 mg  550 mg Oral BID Gladstone Lighter, MD   550 mg at 11/12/17 0935  . sodium chloride flush (NS) 0.9 % injection 3 mL  3 mL Intravenous Q12H Gladstone Lighter, MD   3 mL at 11/12/17 0935  . sodium chloride flush (NS) 0.9 % injection 3 mL  3 mL Intravenous PRN Gladstone Lighter, MD        Musculoskeletal: Strength & Muscle Tone: decreased Gait & Station: unsteady Patient leans: N/A  Psychiatric Specialty Exam: Physical Exam  Nursing note and vitals reviewed. Constitutional: She appears well-developed and well-nourished.  HENT:  Head: Normocephalic and atraumatic.  Eyes: Pupils are equal, round, and reactive to light. Conjunctivae are normal.  Neck: Normal range of motion.  Cardiovascular: Normal heart sounds.  Respiratory: Effort normal.  GI: Soft.  Musculoskeletal: Normal  range of motion.  Neurological: She is alert.  Skin: Skin is warm and dry.     Psychiatric: Her affect is blunt. Her speech is delayed. She is not agitated, not aggressive and not hyperactive. Thought content is not paranoid. Cognition and memory are impaired. She expresses impulsivity. She expresses no homicidal and no suicidal ideation.    Review of Systems  Constitutional: Positive for malaise/fatigue.  HENT: Negative.   Eyes: Negative.   Respiratory: Negative.   Cardiovascular: Negative.   Gastrointestinal: Negative.   Musculoskeletal: Negative.   Skin: Negative.   Neurological: Positive for weakness.  Psychiatric/Behavioral: Negative for depression, hallucinations, memory loss, substance abuse and suicidal ideas. The patient is nervous/anxious and has insomnia.     Blood pressure 92/73, pulse (!) 115, temperature 98 F (36.7 C), temperature source Oral, resp. rate 19, height 5' 6" (1.676 m), weight 255 lb 11.7 oz (116 kg), SpO2 99 %.Body mass index is 41.28 kg/m.  General Appearance: Disheveled  Eye Contact:  Minimal  Speech:  Slow  Volume:  Decreased  Mood:  Dysphoric  Affect:  Congruent  Thought Process:  Coherent  Orientation:  Full (Time, Place, and Person)  Thought Content:  Logical  Suicidal Thoughts:  No  Homicidal Thoughts:  No  Memory:  Immediate;   Fair Recent;   Fair Remote;   Fair  Judgement:  Fair  Insight:  Fair  Psychomotor Activity:  Decreased  Concentration:  Concentration: Fair  Recall:  [employer] of Knowledge:  Fair  Language:  Fair  Akathisia:  No  Handed:  Right  AIMS (if indicated):     Assets:  Communication Skills Desire for Improvement Resilience  ADL's:  Impaired  Cognition:  Impaired,  Mild  Sleep:        Treatment Plan Summary: Medication management and Plan Patient seems to be calm without any signs of major withdrawal or delirium on current dose of Xanax.  No indication to change any medicine right now.  Supportive counseling  and review of plan with patient I will follow-up as needed.  Disposition: No evidence of imminent risk to self or others at present.   Patient does not meet criteria for psychiatric inpatient admission. Supportive therapy provided about ongoing stressors.  Alethia Berthold, MD 11/12/2017 5:18 PM

## 2017-11-12 NOTE — Progress Notes (Signed)
PT Cancellation Note  Patient Details Name: Brandy Cooper MRN: [id number] DOB: 09-Feb-1976   Cancelled Treatment:    Reason Eval/Treat Not Completed: Other (comment)   Attempted session this am.  Declined stating she needed a bath.  Notified nurse tech who attempted to offer bath but she declined.  Returned later in am and friend in helping with bath and unavailable for session.  Seems generally unmotivated to participate in therapy today.   Danielle DessSarah Candace Ramus 11/12/2017, 4:39 PM

## 2017-11-12 NOTE — Progress Notes (Signed)
ID E note Reviewed notes, imaging and labs. I doubt CMV is cause of her active hepatitis but will send a CMV PCR.   I think it would be ok to start steroids for alcoholic hepatitis.

## 2017-11-12 NOTE — Care Management (Signed)
Last PT assessment home health PT was recommended.  Since then patient is ambulating to the nursing station and around the unit with out an assistance or medical equipment.  At this time patient does not meet homebound criteria for home health services.

## 2017-11-13 LAB — CBC
HEMATOCRIT: 26.4 % — AB (ref 35.0–47.0)
Hemoglobin: 9.2 g/dL — ABNORMAL LOW (ref 12.0–16.0)
MCH: 37.7 pg — ABNORMAL HIGH (ref 26.0–34.0)
MCHC: 34.9 g/dL (ref 32.0–36.0)
MCV: 108.1 fL — ABNORMAL HIGH (ref 80.0–100.0)
Platelets: 74 10*3/uL — ABNORMAL LOW (ref 150–440)
RBC: 2.44 MIL/uL — ABNORMAL LOW (ref 3.80–5.20)
RDW: 23.7 % — AB (ref 11.5–14.5)
WBC: 2.8 10*3/uL — AB (ref 3.6–11.0)

## 2017-11-13 LAB — HEPATITIS C VRS RNA DETECT BY PCR-QUAL: Hepatitis C Vrs RNA by PCR-Qual: NEGATIVE

## 2017-11-13 LAB — AMMONIA: AMMONIA: 102 umol/L — AB (ref 9–35)

## 2017-11-13 MED ORDER — VITAMIN B-1 100 MG PO TABS
100.0000 mg | ORAL_TABLET | Freq: Every day | ORAL | Status: DC
  Administered 2017-11-13 – 2017-11-21 (×9): 100 mg via ORAL
  Filled 2017-11-13 (×9): qty 1

## 2017-11-13 MED ORDER — ENSURE ENLIVE PO LIQD
237.0000 mL | Freq: Two times a day (BID) | ORAL | Status: DC
  Administered 2017-11-14 – 2017-11-21 (×8): 237 mL via ORAL

## 2017-11-13 MED ORDER — SODIUM CHLORIDE 0.9 % IV SOLN
INTRAVENOUS | Status: DC
  Administered 2017-11-13: 12:00:00 via INTRAVENOUS

## 2017-11-13 MED ORDER — HALOPERIDOL LACTATE 5 MG/ML IJ SOLN
2.0000 mg | Freq: Once | INTRAMUSCULAR | Status: AC
  Administered 2017-11-13: 2 mg via INTRAMUSCULAR
  Filled 2017-11-13: qty 0.4

## 2017-11-13 MED ORDER — POLYETHYLENE GLYCOL 3350 17 G PO PACK
17.0000 g | PACK | Freq: Two times a day (BID) | ORAL | Status: DC
  Administered 2017-11-14 – 2017-11-21 (×12): 17 g via ORAL
  Filled 2017-11-13 (×14): qty 1

## 2017-11-13 MED ORDER — NYSTATIN 100000 UNIT/GM EX POWD
Freq: Three times a day (TID) | CUTANEOUS | Status: DC
  Administered 2017-11-13 – 2017-11-21 (×23): via TOPICAL
  Filled 2017-11-13: qty 15

## 2017-11-13 MED ORDER — OCUVITE-LUTEIN PO CAPS
1.0000 | ORAL_CAPSULE | Freq: Every day | ORAL | Status: DC
  Administered 2017-11-13: 1 via ORAL
  Filled 2017-11-13 (×6): qty 1

## 2017-11-13 MED ORDER — PROSIGHT PO TABS
1.0000 | ORAL_TABLET | Freq: Every day | ORAL | Status: DC
  Filled 2017-11-13: qty 1

## 2017-11-13 MED ORDER — PREDNISONE 20 MG PO TABS
40.0000 mg | ORAL_TABLET | Freq: Every day | ORAL | Status: DC
  Administered 2017-11-13 – 2017-11-21 (×9): 40 mg via ORAL
  Filled 2017-11-13 (×9): qty 2

## 2017-11-13 NOTE — Progress Notes (Signed)
PT Cancellation Note  Patient Details Name: Brandy Cooper MRN: [id number] DOB: 1975-10-03   Cancelled Treatment:    Reason Eval/Treat Not Completed: Patient declined, no reason specified(Treatment session re-attempted.  Patient generally lethargic; family member at bedside reports recent administration of xanax.  Unable/unwilling to participate with PT at this time.  Will continue efforts at later time/date as medically appropriate and agreeable.)   Hansini Clodfelter H. Manson PasseyBrown, PT, DPT, NCS 11/13/17, 11:53 AM [phone]

## 2017-11-13 NOTE — Progress Notes (Signed)
Brandy Repress, MD [address]  [address]  [place], [place] [address]  Main: [phone]  Fax: [phone] Pager: [phone]   Subjective: Feels weak, wants to know if she is going to be charged for staying in the hospital. She started on prednisolone today after clearance by ID. She denies abdominal pain, nausea, vomiting. She reports being constipated   Objective: Vital signs in last 24 hours: Vitals:   11/12/17 2300 11/13/17 0515 11/13/17 0516 11/13/17 1202  BP: (!) 77/35 (!) 149/83 (!) 149/83 96/62  Pulse:  (!) 107 (!) 109 93  Resp:  (!) 24 (!) 24 16  Temp:  98.1 F (36.7 C) 98.1 F (36.7 C) 97.8 F (36.6 C)  TempSrc:  Oral Oral Oral  SpO2:  100%  100%  Weight:      Height:       Weight change:   Intake/Output Summary (Last 24 hours) at 11/13/2017 2102 Last data filed at 11/13/2017 1812 Gross per 24 hour  Intake 728.83 ml  Output 600 ml  Net 128.83 ml     Exam: Heart:: Regular rate and rhythm or S1S2 present Lungs: clear to auscultation Abdomen: soft, nontender, normal bowel sounds   Lab Results: @LABTEST2 @ Micro Results: Recent Results (from the past 240 hour(s))  Wet prep, genital     Status: Abnormal   Collection Time: 11/02/2017  8:31 AM  Result Value Ref Range Status   Yeast Wet Prep HPF POC NONE SEEN NONE SEEN Final   Trich, Wet Prep NONE SEEN NONE SEEN Final   Clue Cells Wet Prep HPF POC PRESENT (A) NONE SEEN Final   WBC, Wet Prep HPF POC MODERATE (A) NONE SEEN Final   Sperm NONE SEEN  Final    Comment: Performed at [facility], [address] [place], [place] [address]  Chlamydia/NGC rt PCR [facility] only)     Status: None   Collection Time: 11/21/2017  8:31 AM  Result Value Ref Range Status   Specimen source GC/Chlam CHLAMYDIA SPECIES  Final   Chlamydia Tr NOT DETECTED NOT DETECTED Final   N gonorrhoeae NOT DETECTED NOT DETECTED Final    Comment: (NOTE) 100  This methodology has not been evaluated in pregnant women or  in 200  patients with a history of hysterectomy. 300 400  This methodology will not be performed on patients less than 90  years of age. Performed at [facility], [address], [place], [place] [address]    Studies/Results: No results found. Medications: I have reviewed the patient's current medications. Scheduled Meds: . ALPRAZolam  2 mg Oral QHS  . buPROPion  300 mg Oral Daily  . [START ON 11/14/2017] feeding supplement (ENSURE ENLIVE)  237 mL Oral BID BM  . folic acid  1 mg Oral Daily  . lactulose  30 g Oral TID  . lidocaine  1 patch Transdermal Q24H  . multivitamin-lutein  1 capsule Oral Daily  . neomycin-bacitracin-polymyxin   Topical TID  . nystatin   Topical TID  . polyethylene glycol  17 g Oral BID  . predniSONE  40 mg Oral Daily  . rifaximin  550 mg Oral BID  . sodium chloride flush  3 mL Intravenous Q12H  . thiamine  100 mg Oral Daily   Continuous Infusions: . sodium chloride    . 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 We'll follow along with you   LOS: 7 days   Brandy Cooper 11/13/2017, 9:02 PM

## 2017-11-13 NOTE — Progress Notes (Signed)
Pt appears to be less agitated, still refusing to take scheduled medications, attempted to reorient.

## 2017-11-13 NOTE — Consult Note (Signed)
[facility]     Reason for Consult:hepatitis, + CMV IGM   Referring Physician: Nicholes Mango Date of Admission:  11/02/2017   Principal Problem:   Subacute delirium Active Problems:   Alcohol abuse   ARF (acute renal failure) (HCC)   HPI: Brandy Cooper is a 42 y.o. female admitted with fall and vaginal bleeding. Found to have ARF, elevated LFTs, elevated INR.  Liver USS showed cirrhosis. MRCP done as well. Seen by GI. Has hx of heavy etoh (1/5 of liquor daily) for 5 years. She has elevated T bili and ammonia levels. Started on lactulose,.  On serological wu had neg Hep B, C, HIV. 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 She denies recent   She has a hx as well of history of anxiety, bipolar, depression, alcohol abuse, substance abuse    Past Medical History:  Diagnosis Date  . Anxiety   . Bipolar affect, depressed ([place])   . Depression    Past Surgical History:  Procedure Laterality Date  . CHOLECYSTECTOMY    . LAPAROSCOPIC GASTRIC SLEEVE RESECTION    . TONSILLECTOMY     Social History   Tobacco Use  . Smoking status: Former Smoker    Types: Cigarettes  . Smokeless tobacco: Never Used  . Tobacco comment: quit since 2 months  Substance Use Topics  . Alcohol use: No  . Drug use: No   Family History  Family history unknown: Yes    Allergies:  Allergies  Allergen Reactions  . Celebrex [Celecoxib] Hives  . Morphine And Related Itching    Current antibiotics: Antibiotics Given (last 72 hours)    Date/Time Action Medication Dose   11/10/17 1021 Given   metroNIDAZOLE (FLAGYL) tablet 500 mg 500 mg   11/10/17 1021 Given   rifaximin (XIFAXAN) tablet 550 mg 550 mg   11/10/17 2153 Given   metroNIDAZOLE (FLAGYL) tablet 500 mg 500 mg   11/10/17 2154 Given   rifaximin (XIFAXAN) tablet 550 mg 550 mg   11/11/17 0924 Given   metroNIDAZOLE (FLAGYL) tablet 500 mg 500 mg   11/11/17 0924 Given   rifaximin (XIFAXAN) tablet 550 mg 550 mg   11/11/17 2141 Given    metroNIDAZOLE (FLAGYL) tablet 500 mg 500 mg   11/11/17 2141 Given   rifaximin (XIFAXAN) tablet 550 mg 550 mg   11/12/17 0935 Given   metroNIDAZOLE (FLAGYL) tablet 500 mg 500 mg   11/12/17 0935 Given   rifaximin (XIFAXAN) tablet 550 mg 550 mg   11/12/17 2130 Given   rifaximin (XIFAXAN) tablet 550 mg 550 mg   11/12/17 2131 Given   metroNIDAZOLE (FLAGYL) tablet 500 mg 500 mg      MEDICATIONS: . ALPRAZolam  2 mg Oral QHS  . buPROPion  300 mg Oral Daily  . folic acid  1 mg Oral Daily  . lactulose  30 g Oral TID  . lidocaine  1 patch Transdermal Q24H  . metroNIDAZOLE  500 mg Oral Q12H  . neomycin-bacitracin-polymyxin   Topical TID  . predniSONE  40 mg Oral Daily  . rifaximin  550 mg Oral BID  . sodium chloride flush  3 mL Intravenous Q12H    Review of Systems - 11 systems reviewed and negative per HPI   OBJECTIVE: Temp:  [98 F (36.7 C)-98.4 F (36.9 C)] 98.1 F (36.7 C) (04/19 0516) Pulse Rate:  [103-115] 109 (04/19 0516) Resp:  [19-24] 24 (04/19 0516) BP: (64-149)/(33-83) 149/83 (04/19 0516) SpO2:  [99 %-100 %]  100 % (04/19 0515) Physical Exam  Constitutional:  Obese, very slow mentation HENT: [place]/AT, PERRLA, + scleral icterus Mouth/Throat: Oropharynx is clear and dry . No oropharyngeal exudate.  Cardiovascular: Normal rate, regular rhythm and normal heart sound Pulmonary/Chest: Effort normal and breath sounds normal. No respiratory distress.  has no wheezes.  Neck = supple, no nuchal rigidity Abdominal: Soft. Obese  Bowel sounds are normal.  exhibits no distension. There is no tenderness.  Lymphadenopathy: no cervical adenopathy. No axillary adenopathy Neurological:very slow mentation, + asterexis Skin: bil dorsum of hands with dry scaly skin, some bruising  Psychiatric: lethargic  LABS: Results for orders placed or performed during the hospital encounter of 10/28/2017 (from the past 48 hour(s))  Comprehensive metabolic panel     Status: Abnormal   Collection Time:  11/12/17  4:59 AM  Result Value Ref Range   Sodium 136 135 - 145 mmol/L   Potassium 4.0 3.5 - 5.1 mmol/L   Chloride 110 101 - 111 mmol/L   CO2 20 (L) 22 - 32 mmol/L   Glucose, Bld 103 (H) 65 - 99 mg/dL   BUN 22 (H) 6 - 20 mg/dL   Creatinine, Ser 1.30 (H) 0.44 - 1.00 mg/dL   Calcium 8.5 (L) 8.9 - 10.3 mg/dL   Total Protein 5.8 (L) 6.5 - 8.1 g/dL   Albumin 2.3 (L) 3.5 - 5.0 g/dL   AST 70 (H) 15 - 41 U/L   ALT 21 14 - 54 U/L   Alkaline Phosphatase 60 38 - 126 U/L   Total Bilirubin 8.6 (H) 0.3 - 1.2 mg/dL   GFR calc non Af Amer 50 (L) >60 mL/min   GFR calc Af Amer 58 (L) >60 mL/min    Comment: (NOTE) The eGFR has been calculated using the CKD EPI equation. This calculation has not been validated in all clinical situations. eGFR's persistently <60 mL/min signify possible Chronic Kidney Disease.    Anion gap 6 5 - 15    Comment: Performed at [facility], [address] [place], [place] [address]  Protime-INR     Status: Abnormal   Collection Time: 11/12/17  4:59 AM  Result Value Ref Range   Prothrombin Time 32.7 (H) 11.4 - 15.2 seconds   INR 3.22     Comment: Performed at [facility], [address] [place], [place] [address]  Ammonia     Status: Abnormal   Collection Time: 11/12/17  4:59 AM  Result Value Ref Range   Ammonia 107 (H) 9 - 35 umol/L    Comment: Performed at [facility], [address] [place], [place] [address]  CBC     Status: Abnormal   Collection Time: 11/12/17  4:59 AM  Result Value Ref Range   WBC 3.7 3.6 - 11.0 K/uL   RBC 2.52 (L) 3.80 - 5.20 MIL/uL   Hemoglobin 9.4 (L) 12.0 - 16.0 g/dL   HCT 27.2 (L) 35.0 - 47.0 %   MCV 107.9 (H) 80.0 - 100.0 fL   MCH 37.1 (H) 26.0 - 34.0 pg   MCHC 34.4 32.0 - 36.0 g/dL   RDW 23.9 (H) 11.5 - 14.5 %   Platelets 72 (L) 150 - 440 K/uL    Comment: Performed at [facility], [address] [place], [place] [address]  Ammonia     Status: Abnormal   Collection Time: 11/13/17   5:21 AM  Result Value Ref Range   Ammonia 102 (H) 9 - 35 umol/L  Comment: Performed at [facility], [address] [place], [place] [address]  CBC     Status: Abnormal   Collection Time: 11/13/17  5:30 AM  Result Value Ref Range   WBC 2.8 (L) 3.6 - 11.0 K/uL   RBC 2.44 (L) 3.80 - 5.20 MIL/uL   Hemoglobin 9.2 (L) 12.0 - 16.0 g/dL   HCT 26.4 (L) 35.0 - 47.0 %   MCV 108.1 (H) 80.0 - 100.0 fL   MCH 37.7 (H) 26.0 - 34.0 pg   MCHC 34.9 32.0 - 36.0 g/dL   RDW 23.7 (H) 11.5 - 14.5 %   Platelets 74 (L) 150 - 440 K/uL    Comment: Performed at [facility], [address] [place], [place] [address]   No components found for: ESR, C REACTIVE PROTEIN MICRO: Recent Results (from the past 720 hour(s))  Wet prep, genital     Status: Abnormal   Collection Time: 11/05/2017  8:31 AM  Result Value Ref Range Status   Yeast Wet Prep HPF POC NONE SEEN NONE SEEN Final   Trich, Wet Prep NONE SEEN NONE SEEN Final   Clue Cells Wet Prep HPF POC PRESENT (A) NONE SEEN Final   WBC, Wet Prep HPF POC MODERATE (A) NONE SEEN Final   Sperm NONE SEEN  Final    Comment: Performed at [facility], [address] [place], [place] [address]  [place] rt PCR [facility] only)     Status: None   Collection Time: 11/08/2017  8:31 AM  Result Value Ref Range Status   Specimen source GC/Chlam CHLAMYDIA SPECIES  Final   Chlamydia Tr NOT DETECTED NOT DETECTED Final   N gonorrhoeae NOT DETECTED NOT DETECTED Final    Comment: (NOTE) 100  This methodology has not been evaluated in pregnant women or in 200  patients with a history of hysterectomy. 300 400  This methodology will not be performed on patients less than 80  years of age. Performed at [facility], [address] [place], [place] [address]     IMAGING: Dg Lumbar Spine 2-3 Views  Result Date: 11/10/2017 CLINICAL DATA:  Low back pain for 2 years, recent fall with increased pain, initial encounter EXAM: LUMBAR SPINE -  3 VIEW COMPARISON:  None. FINDINGS: Five lumbar type vertebral bodies are well visualized. Disc space narrowing is noted at L3-4, L4-5 and L5-S1. Mild osteophytic changes are seen. No anterolisthesis is noted. No soft tissue changes are noted. IMPRESSION: Degenerative change without acute abnormality. Electronically Signed   By: Inez Catalina M.D.   On: 11/10/2017 08:30   Dg Abd 1 View  Result Date: 11/10/2017 CLINICAL DATA:  Constipation for 2-3 days, history of cirrhosis EXAM: ABDOMEN - 1 VIEW COMPARISON:  Lumbar spine films of 11/10/2017 FINDINGS: Supine views of the abdomen show no evidence of bowel obstruction. Surgical clips are present in the right upper quadrant from prior cholecystectomy and sutures are noted in the left upper quadrant possibly due to prior gastric surgery. IUD is noted in the mid pelvis. No opaque calculi are seen. There are degenerative changes in the lower lumbar spine. IMPRESSION: 1. No bowel obstruction. 2. No opaque calculi. 3. Degenerative change in the mid to lower lumbar spine Electronically Signed   By: Ivar Drape M.D.   On: 11/10/2017 11:57   US Pelvis Transvanginal Non-ob (tv Only)  Result Date: 11/05/2017 CLINICAL DATA:  Vaginal bleeding for several months EXAM: TRANSABDOMINAL AND TRANSVAGINAL ULTRASOUND OF PELVIS TECHNIQUE: Both transabdominal  and transvaginal ultrasound examinations of the pelvis were performed. Transabdominal technique was performed for global imaging of the pelvis including uterus, ovaries, adnexal regions, and pelvic cul-de-sac. It was necessary to proceed with endovaginal exam following the transabdominal exam to visualize the ovaries. COMPARISON:  09/22/2012 CT of the abdomen pelvis. FINDINGS: Uterus Measurements: 7.4 x 4.3 x 5.1 cm. No fibroids or other mass visualized. Nabothian cysts are noted. Endometrium Thickness: 2.8 mm.  IUD is noted in place. Right ovary Measurements: 4.0 x 2.6 x 3.4 cm. Normal appearance/no adnexal mass. Left ovary  Measurements: 3.0 x 2.3 x 2.1 cm. Normal appearance/no adnexal mass. Other findings Moderate free pelvic fluid is noted. This may be physiologic in nature. IMPRESSION: IUD in place. Moderate free pelvic fluid. This may be physiologic in nature. Possibility of recent ovarian cyst rupture could not be totally excluded. No findings of ovarian cysts are seen. Electronically Signed   By: Inez Catalina M.D.   On: 10/26/2017 10:10   US Pelvis Complete  Result Date: 11/10/2017 CLINICAL DATA:  Vaginal bleeding for several months EXAM: TRANSABDOMINAL AND TRANSVAGINAL ULTRASOUND OF PELVIS TECHNIQUE: Both transabdominal and transvaginal ultrasound examinations of the pelvis were performed. Transabdominal technique was performed for global imaging of the pelvis including uterus, ovaries, adnexal regions, and pelvic cul-de-sac. It was necessary to proceed with endovaginal exam following the transabdominal exam to visualize the ovaries. COMPARISON:  09/22/2012 CT of the abdomen pelvis. FINDINGS: Uterus Measurements: 7.4 x 4.3 x 5.1 cm. No fibroids or other mass visualized. Nabothian cysts are noted. Endometrium Thickness: 2.8 mm.  IUD is noted in place. Right ovary Measurements: 4.0 x 2.6 x 3.4 cm. Normal appearance/no adnexal mass. Left ovary Measurements: 3.0 x 2.3 x 2.1 cm. Normal appearance/no adnexal mass. Other findings Moderate free pelvic fluid is noted. This may be physiologic in nature. IMPRESSION: IUD in place. Moderate free pelvic fluid. This may be physiologic in nature. Possibility of recent ovarian cyst rupture could not be totally excluded. No findings of ovarian cysts are seen. Electronically Signed   By: Inez Catalina M.D.   On: 11/05/2017 10:10   US Renal  Result Date: 11/09/2017 CLINICAL DATA:  Acute renal failure EXAM: RENAL ULTRASOUND COMPARISON:  CT abdomen and pelvis September 22, 2012 FINDINGS: Right Kidney: Length: 10.2 cm. Echogenicity and renal cortical thickness are within normal limits. No mass,  perinephric fluid, or hydronephrosis visualized. No sonographically demonstrable calculus or ureterectasis. Left Kidney: Length: 11.3 cm. Echogenicity within normal limits. There is renal cortical thinning on the left. No perinephric fluid or hydronephrosis visualized. There is a cyst arising from the lower pole left kidney measuring 2.9 x 2.7 x 3.0 cm. No sonographically demonstrable calculus or ureterectasis. Bladder: Empty and could not be visualized. IMPRESSION: Renal cortical thinning on the left, a finding that may be associated with medical renal disease. Renal cortical thickness on the right is within normal limits. Renal echogenicity normal bilaterally. No obstructing focus in either kidney. There is a cyst in the lower pole left kidney region. Electronically Signed   By: Lowella Grip III M.D.   On: 11/09/2017 11:28   Mr Abdomen Mrcp Wo Contrast  Result Date: 11/09/2017 CLINICAL DATA:  Cirrhosis. Surgical history of gastric sleeve and cholecystectomy. Hyperbilirubinemia. EXAM: MRI ABDOMEN WITHOUT CONTRAST  (INCLUDING MRCP) TECHNIQUE: Multiplanar multisequence MR imaging of the abdomen was performed. Heavily T2-weighted images of the biliary and pancreatic ducts were obtained, and three-dimensional MRCP images were rendered by post processing. COMPARISON:  CT 09/22/2012 FINDINGS: Exam is  degraded by patient respiratory motion. Patient encephalopathic and could not follow breath hold commands. Free breathing technique use. Lower chest:  Lung bases are clear. Hepatobiliary: Marked loss of signal intensity on opposed phase imaging consistent with hepatic steatosis (series 18). No intrahepatic biliary duct dilatation. The common bile duct is mildly dilated to 9 mm following cholecystectomy. No choledocholithiasis. No IV contrast administered. Pancreas: Normal pancreatic parenchymal intensity. No ductal dilatation or inflammation. Spleen: Moderate splenomegaly with the spleen measuring 16 cm in  craniocaudad dimension Adrenals/urinary tract: Adrenal glands and kidneys are normal. Stomach/Bowel: Stomach limited view of the bowel is unremarkable. Vascular/Lymphatic: Normal caliber Musculoskeletal: No aggressive osseous lesion IMPRESSION: 1. No evidence of biliary obstruction. Mild extrahepatic duct dilatation typical following cholecystectomy. 2. Severe hepatic steatosis. 3. No discrete lesion liver identified on noncontrast exam. 4. Mild splenomegaly. 5. No ascites. Electronically Signed   By: Suzy Bouchard M.D.   On: 11/09/2017 23:37   Dg Hip Unilat  With Pelvis 2-3 Views Right  Result Date: 10/20/2017 CLINICAL DATA:  Fall, hip pain EXAM: DG HIP (WITH OR WITHOUT PELVIS) 2-3V RIGHT COMPARISON:  None. FINDINGS: No fracture or dislocation is seen. Bilateral hip joint spaces are preserved. Visualized bony pelvis appears intact. Degenerative changes at L4-5. IUD overlying the pelvis. IMPRESSION: Negative. Electronically Signed   By: Julian Hy M.D.   On: 10/20/2017 09:14   US Abdomen Limited Ruq  Result Date: 11/07/2017 CLINICAL DATA:  Hepatic cirrhosis EXAM: ULTRASOUND ABDOMEN LIMITED RIGHT UPPER QUADRANT COMPARISON:  CT abdomen and pelvis September 22, 2012 FINDINGS: Gallbladder: Surgically absent. Common bile duct: Diameter: 12 mm proximally. Distal common bile duct could not be visualized due to overlying gas. Liver: No focal lesion identified. Liver echogenicity is diffusely increased. Portal vein is patent on color Doppler imaging with normal direction of blood flow towards the liver. Other: There is trace ascites. IMPRESSION: 1. Gallbladder absent. Visualized common bile duct is dilated to 12 mm. More distal common bile duct could not be visualized due to overlying gas. This finding may warrant MRCP to further evaluate given the dilatation proximally. 2. Diffuse increase in liver echogenicity, a finding indicative of hepatic steatosis, potentially with underlying parenchymal liver  disease. While no focal liver lesions are identified on this study, it must be cautioned that the sensitivity of ultrasound for detection of focal liver lesions is diminished significantly in this circumstance. 3.  Trace ascites. Electronically Signed   By: Lowella Grip III M.D.   On: 11/07/2017 17:09    Assessment:   Brandy Cooper is a 42 y.o. female with heavy ETOH hx and new dx of cirrhosis.  Asked to see for + CMV IgM.  This is chronic liver disease given the severe hepatic steatosis on MRI, elevated INR and ammonia and TCP.  ALT and AST are only mildly elevated. She has no recent mono like illness. I think the + Igm for HSV and CMV are not related at all.   Recommendations No antiviral therapy indicated. I have ordered CMV PCR and HSV IGG I think it would be beneficial to treat alcoholic hepatitis with steroids as you are.  Thank you very much for allowing me to participate in the care of this patient. Please call with questions.   Cheral Marker. Ola Spurr, MD

## 2017-11-13 NOTE — Progress Notes (Signed)
Notified Md that pt still not agreeing to have IV restarted at this time

## 2017-11-13 NOTE — Progress Notes (Signed)
[employer] [facility] [facility] [place] [facility]   PATIENT NAME: Brandy Cooper    MR#:  [id number]  DATE OF BIRTH:  [birth year]  SUBJECTIVE:  CHIEF COMPLAINT:   Chief Complaint  Patient presents with  . Vaginal Bleeding  Patient is feeling weak and tired lives alone, and mom does not know how much she drinks on daily basis.  Patient is refusing physical therapy Mom and daughter have unrealistic expectations thinking that though she is drinking alcohol her liver will get better REVIEW OF SYSTEMS:  Review of Systems  Constitutional: Positive for malaise/fatigue. Negative for chills and fever.  HENT: Negative for congestion, ear discharge, hearing loss and nosebleeds.   Eyes: Negative for blurred vision and double vision.  Respiratory: Negative for cough, shortness of breath and wheezing.   Cardiovascular: Negative for chest pain and palpitations.  Gastrointestinal: Negative for abdominal pain, constipation, diarrhea, nausea and vomiting.  Genitourinary: Negative for dysuria.  Musculoskeletal: Positive for back pain and myalgias.  Neurological: Positive for weakness. Negative for dizziness, speech change, focal weakness, seizures and headaches.   DRUG ALLERGIES:   Allergies  Allergen Reactions  . Celebrex [Celecoxib] Hives  . Morphine And Related Itching    VITALS:  Blood pressure 96/62, pulse 93, temperature 97.8 F (36.6 C), temperature source Oral, resp. rate 16, height 5\' 6"  (1.676 m), weight 116 kg (255 lb 11.7 oz), SpO2 100 %.  PHYSICAL EXAMINATION:  Physical Exam  GENERAL:  42 y.o.-year-old patient lying in the bed with no acute distress.  EYES: Pupils equal, round, reactive to light and accommodation. No scleral icterus. Extraocular muscles intact. Icteric sclerae HEENT: Head atraumatic, normocephalic. Oropharynx and nasopharynx clear.  NECK:  Supple, no jugular venous distention. No thyroid enlargement, no tenderness.  LUNGS: Normal breath sounds bilaterally,  no wheezing, rales,rhonchi or crepitation. No use of accessory muscles of respiration. Decreased bibasilar breath sounds CARDIOVASCULAR: S1, S2 normal. No murmurs, rubs, or gallops.  ABDOMEN: Soft, obese, nontender, nondistended. Bowel sounds present. No organomegaly or mass.  EXTREMITIES: No pedal edema, cyanosis, or clubbing.  NEUROLOGIC: Cranial nerves II through XII are intact. Muscle strength 5/5 in all extremities. Sensation intact. Gait not checked.  Global weakness noted PSYCHIATRIC: The patient is alert and oriented x 3 SKIN: No obvious rash, lesion, or ulcer.    LABORATORY PANEL:   CBC Recent Labs  Lab 11/13/17 0530  WBC 2.8*  HGB 9.2*  HCT 26.4*  PLT 74*   ------------------------------------------------------------------------------------------------------------------  Chemistries  Recent Labs  Lab 11/12/17 0459  NA 136  K 4.0  CL 110  CO2 20*  GLUCOSE 103*  BUN 22*  CREATININE 1.30*  CALCIUM 8.5*  AST 70*  ALT 21  ALKPHOS 60  BILITOT 8.6*   ------------------------------------------------------------------------------------------------------------------  Cardiac Enzymes No results for input(s): TROPONINI in the last 168 hours. ------------------------------------------------------------------------------------------------------------------  RADIOLOGY:  No results found.  EKG:   Orders placed or performed during the hospital encounter of 10/27/2017  . ED EKG  . ED EKG  . EKG 12-Lead  . EKG 12-Lead  . EKG    ASSESSMENT AND PLAN:   Brandy Cooper  is a 42 y.o. female with a known history of anxiety and depression, status post laparoscopic gastric sleeve resection, dysfunctional uterine bleeding presents to hospital secondary to a fall.  #. 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 -Patient was counseled to stop drinking alcohol and she will be benefited with outpatient alcohol  Anonymous -continue lactulose and  xifaxan -  - ammonia still elevated at 102(was 139-109-107), - GI following - EGD as outpt - BP is low to add any lasix or aldactone -Outpatient follow-up with gastroenterology as recommended for possible biopsy to know the staging of alcoholic liver cirrhosis  2.  Acute renal failure- initially thought prerenal, however her BUN is not significantly increased. - could be hepatorenal, with new diagnosis of cirrhosis -continue lasix and aldactone once BP improves -Hold metformin and other nephrotoxins  3.  Alcoholic hepatitis and IgM CMV positive CMV PCR sent ID recommended steroids for alcoholic hepatitis.  Patient is started on prednisolone 40 mg once daily will continue for 28 days and taper and a course of 2 weeks  4.  Depression/anxiety/subs abuse-appreciate psych input - continue the Wellbutrin & Xanax.     5. Bacterial vaginosis/intertrigo- flagyl 500mg  bid for 7 days Nystatin powder Out of bed as tolerated  6.  Dysfunctional uterine bleeding-going on for a few months, had a left ovarian cyst based on pelvic ultrasound 2 months ago. -Missed recent follow-up visits and increased bleeding with worsening anemia. -Appreciate OB/GYN consult-they have recommended outpatient follow-up since no active bleeding at this time  7.  DVT prophylaxis-teds and SCDs.  Hold off on heparin products due to bleeding and thrombocytopenia with platelet count 74,000      All the records are reviewed and case discussed with Care Management/Social Workerr. Management plans discussed with the patient, mom at bedside.  Verbalized understanding of the plan CODE STATUS: Full Code  TOTAL TIME TAKING CARE OF THIS PATIENT: 28 minutes.   POSSIBLE D/C IN 1-2 DAYS, DEPENDING ON CLINICAL CONDITION.   Ramonita LabAruna Abcde Oneil M.D on 11/13/2017 at 2:19 PM  Between 7am to 6pm - Pager - [phone]  After 6pm go to [email or web address] - password [email or web address]  [facility]    Office  [phone]  CC: Primary care physician; Dortha KernBliss, Laura K, MD

## 2017-11-13 NOTE — Progress Notes (Signed)
Pt very confused, argumentative with staff and mother. She is wanting to leave stating "we are holding her hostage". Alert to name but confused to place and time, attempted to reorient. IV infiltrated, she would not let us discontinue it at this time. Security called for assistance. Notified Md of status, orders taken, Haldol given.

## 2017-11-14 DIAGNOSIS — K7011 Alcoholic hepatitis with ascites: Secondary | ICD-10-CM

## 2017-11-14 LAB — HEPATIC FUNCTION PANEL
ALT: 25 U/L (ref 14–54)
AST: 82 U/L — AB (ref 15–41)
Albumin: 2.8 g/dL — ABNORMAL LOW (ref 3.5–5.0)
Alkaline Phosphatase: 65 U/L (ref 38–126)
BILIRUBIN DIRECT: 3.1 mg/dL — AB (ref 0.1–0.5)
BILIRUBIN INDIRECT: 3.3 mg/dL — AB (ref 0.3–0.9)
BILIRUBIN TOTAL: 6.4 mg/dL — AB (ref 0.3–1.2)
Total Protein: 6.4 g/dL — ABNORMAL LOW (ref 6.5–8.1)

## 2017-11-14 LAB — CBC
HEMATOCRIT: 26 % — AB (ref 35.0–47.0)
Hemoglobin: 9.3 g/dL — ABNORMAL LOW (ref 12.0–16.0)
MCH: 38.7 pg — AB (ref 26.0–34.0)
MCHC: 35.6 g/dL (ref 32.0–36.0)
MCV: 108.6 fL — AB (ref 80.0–100.0)
PLATELETS: 86 10*3/uL — AB (ref 150–440)
RBC: 2.39 MIL/uL — ABNORMAL LOW (ref 3.80–5.20)
RDW: 23.4 % — ABNORMAL HIGH (ref 11.5–14.5)
WBC: 5.5 10*3/uL (ref 3.6–11.0)

## 2017-11-14 LAB — PROTIME-INR
INR: 3.24
Prothrombin Time: 32.8 seconds — ABNORMAL HIGH (ref 11.4–15.2)

## 2017-11-14 LAB — AMMONIA: AMMONIA: 130 umol/L — AB (ref 9–35)

## 2017-11-14 MED ORDER — LACTULOSE 10 GM/15ML PO SOLN
30.0000 g | Freq: Three times a day (TID) | ORAL | Status: DC
  Administered 2017-11-14 – 2017-11-15 (×3): 30 g via ORAL
  Filled 2017-11-14 (×3): qty 60

## 2017-11-14 MED ORDER — PHYTONADIONE 5 MG PO TABS
10.0000 mg | ORAL_TABLET | Freq: Every day | ORAL | Status: DC
  Administered 2017-11-14 – 2017-11-21 (×8): 10 mg via ORAL
  Filled 2017-11-14 (×8): qty 2

## 2017-11-14 MED ORDER — LACTULOSE ENEMA: 300.0000 mL | Freq: Two times a day (BID) | ORAL | Status: DC

## 2017-11-14 NOTE — Progress Notes (Signed)
Pt would not let lab draw blood, will re attempt later time

## 2017-11-14 NOTE — Progress Notes (Signed)
PT Cancellation Note  Patient Details Name: Brandy Cooper MRN: [id number] DOB: 05/15/1976   Cancelled Treatment:    Reason Eval/Treat Not Completed: Fatigue/lethargy limiting ability to participate   Attempted session this am.  Pt lethargic.  Sitter stated she was given Haldol this am.  Had walked to [place] but generally unsteady which she attributed to medication.  Will try to reschedule this pm.  Danielle DessSarah Llewyn Heap 11/14/2017, 11:36 AM

## 2017-11-14 NOTE — Progress Notes (Signed)
Physical Therapy Treatment Patient Details Name: Brandy Cooper MRN: [id number] DOB: 02-Jul-1976 Today's Date: 11/14/2017    History of Present Illness presented to ER secondary to vaginal bleeding, dizziness, lightheadedness and fall; admitted wiht acute renal failure, dysfunctional uterine bleeding (non-acute) and AMS (likely related to elevated ammonia, hepatic encephalopathy).    PT Comments    Pt more alert compared to this morning and agreeable to work with therapy.  She requires light min assist for sit>stand transfers due to unsteadiness which likely is a result of no OOB for at least several hours.  Introduced challenges to the pt's balance while ambulating today with pt demonstrating increased unsteadiness with vertical and horizontal head turns and high knees marching.  Follow up recommendations remain appropriate at this time.     Follow Up Recommendations  Home health PT     Equipment Recommendations       Recommendations for Other Services       Precautions / Restrictions Precautions Precautions: Fall Restrictions Weight Bearing Restrictions: No    Mobility  Bed Mobility Overal bed mobility: Modified Independent             General bed mobility comments: Increased effort but no physical assist or cues needed  Transfers Overall transfer level: Needs assistance Equipment used: None Transfers: Sit to/from Stand Sit to Stand: Min assist         General transfer comment: Light min assist required for sit>stand [facility] due to unsteadiness.   Ambulation/Gait Ambulation/Gait assistance: Min guard Ambulation Distance (Feet): 320 Feet Assistive device: None Gait Pattern/deviations: Decreased stride length Gait velocity: decreased   General Gait Details: Pt unsteady but no LOB.  Increased unsteadiness with higher level balance activities documented below, specifically with head turns vertically and horizontally as well as high knees  marching.    Stairs             Wheelchair Mobility    Modified Rankin (Stroke Patients Only)       Balance Overall balance assessment: Needs assistance Sitting-balance support: No upper extremity supported;Feet supported Sitting balance-Leahy Scale: Good     Standing balance support: No upper extremity supported;During functional activity Standing balance-Leahy Scale: Fair               High level balance activites: Backward walking;Direction changes;Turns;Sudden stops;Head turns;Other (comment)(increased gait speed, high knees marching) High Level Balance Comments: Unsteadiness especially with horizontal and vertical head turns and high knees marching            Cognition Arousal/Alertness: Awake/alert Behavior During Therapy: Flat affect Overall Cognitive Status: Difficult to assess                                 General Comments: delayed processing, initiation and overall interaction; limited interest, engagement with session      Exercises      General Comments        Pertinent Vitals/Pain Pain Assessment: No/denies pain    Home Living                      Prior Function            PT Goals (current goals can now be found in the care plan section) Acute Rehab PT Goals PT Goal Formulation: With patient Time For Goal Achievement: 11/23/17 Potential to Achieve Goals: Good Progress towards PT goals: Progressing toward  goals    Frequency    Min 2X/week      PT Plan Current plan remains appropriate    Co-evaluation              AM-PAC PT "6 Clicks" Daily Activity  Outcome Measure  Difficulty turning over in bed (including adjusting bedclothes, sheets and blankets)?: None Difficulty moving from lying on back to sitting on the side of the bed? : None Difficulty sitting down on and standing up from a chair with arms (e.g., wheelchair, bedside commode, etc,.)?: Unable Help needed moving to and from a  bed to chair (including a wheelchair)?: A Little Help needed walking in hospital room?: A Little Help needed climbing 3-5 steps with a railing? : A Little 6 Click Score: 18    End of Session Equipment Utilized During Treatment: Gait belt Activity Tolerance: Patient tolerated treatment well Patient left: with call bell/phone within reach;in bed;with bed alarm set;with nursing/sitter in room Nurse Communication: Mobility status PT Visit Diagnosis: Difficulty in walking, not elsewhere classified (R26.2);Muscle weakness (generalized) (M62.81)     Time: 1610-9604 PT Time Calculation (min) (ACUTE ONLY): 22 min  Charges:  $Gait Training: 8-22 mins                    G Codes:       Encarnacion Chu PT, DPT 11/14/2017, 4:11 PM

## 2017-11-14 NOTE — Progress Notes (Signed)
[facility] [place] at [facility]   PATIENT NAME: Brandy Cooper    MR#:  [id number]  DATE OF BIRTH:  1976/06/19  SUBJECTIVE:  CHIEF COMPLAINT:   Chief Complaint  Patient presents with  . Vaginal Bleeding  Patient is very lethargic as she received Haldol for agitation.  No family members at bedside REVIEW OF SYSTEMS:  Review of system unobtainable DRUG ALLERGIES:   Allergies  Allergen Reactions  . Celebrex [Celecoxib] Hives  . Morphine And Related Itching    VITALS:  Blood pressure 108/68, pulse 93, temperature (!) 97.5 F (36.4 C), temperature source Oral, resp. rate 18, height 5\' 6"  (1.676 m), weight 116 kg (255 lb 11.7 oz), SpO2 98 %.  PHYSICAL EXAMINATION:  Physical Exam  GENERAL:  42 y.o.-year-old patient lying in the bed with no acute distress.  EYES: Pupils equal, round, reactive to light and accommodation. No scleral icterus. Extraocular muscles intact. Icteric sclerae HEENT: Head atraumatic, normocephalic. Oropharynx and nasopharynx clear.  NECK:  Supple, no jugular venous distention. No thyroid enlargement, no tenderness.  LUNGS: Normal breath sounds bilaterally, no wheezing, rales,rhonchi or crepitation. No use of accessory muscles of respiration. Decreased bibasilar breath sounds CARDIOVASCULAR: S1, S2 normal. No murmurs, rubs, or gallops.  ABDOMEN: Soft, obese, nontender, nondistended. Bowel sounds present. No organomegaly or mass.  EXTREMITIES: No pedal edema, cyanosis, or clubbing.  NEUROLOGIC: Cranial nerves II through XII are intact. Muscle strength 5/5 in all extremities. Sensation intact. Gait not checked.  Global weakness noted PSYCHIATRIC: The patient is alert and oriented x 3 SKIN: No obvious rash, lesion, or ulcer.    LABORATORY PANEL:   CBC Recent Labs  Lab 11/14/17 0634  WBC 5.5  HGB 9.3*  HCT 26.0*  PLT 86*    ------------------------------------------------------------------------------------------------------------------  Chemistries  Recent Labs  Lab 11/12/17 0459 11/14/17 0634  NA 136  --   K 4.0  --   CL 110  --   CO2 20*  --   GLUCOSE 103*  --   BUN 22*  --   CREATININE 1.30*  --   CALCIUM 8.5*  --   AST 70* 82*  ALT 21 25  ALKPHOS 60 65  BILITOT 8.6* 6.4*   ------------------------------------------------------------------------------------------------------------------  Cardiac Enzymes No results for input(s): TROPONINI in the last 168 hours. ------------------------------------------------------------------------------------------------------------------  RADIOLOGY:  No results found.  EKG:   Orders placed or performed during the hospital encounter of 11/21/2017  . ED EKG  . ED EKG  . EKG 12-Lead  . EKG 12-Lead  . EKG    ASSESSMENT AND PLAN:   Aylene Acoff  is a 42 y.o. female with a known history of anxiety and depression, status post laparoscopic gastric sleeve resection, dysfunctional uterine bleeding presents to hospital secondary to a fall.  #. 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 -Patient was counseled to stop drinking alcohol and she will be benefited with outpatient alcohol Anonymous -continue lactulose and xifaxan -patient is alert and tolerating medications by mouth this afternoon as reported by the RN - ammonia still elevated at 130 (was [phone]), -?  Is this her new baseline ammonia -GI following - EGD as outpt - BP is low to add any lasix or aldactone -Outpatient follow-up with gastroenterology as recommended for possible biopsy to know the staging of alcoholic liver cirrhosis  2.  Acute renal failure- initially thought prerenal, however her BUN is not significantly increased. - could be hepatorenal, with new diagnosis  of cirrhosis -continue lasix and aldactone once BP  improves -Hold metformin and other nephrotoxins  3.  Alcoholic hepatitis and IgM CMV positive CMV PCR sent ID recommended steroids for alcoholic hepatitis.  Patient is started on prednisolone 40 mg once daily will continue for 28 days and taper and a course of 2 weeks  4.  Depression/anxiety/subs abuse-appreciate psych input - continue the Wellbutrin & Xanax.     5. Bacterial vaginosis/intertrigo- flagyl 500mg  bid for 7 days Nystatin powder Out of bed as tolerated  6.  Dysfunctional uterine bleeding-going on for a few months, had a left ovarian cyst based on pelvic ultrasound 2 months ago. -Missed recent follow-up visits and increased bleeding with worsening anemia. -Appreciate OB/GYN consult-they have recommended outpatient follow-up since no active bleeding at this time  7.  DVT prophylaxis-teds and SCDs.  Hold off on heparin products due to bleeding and thrombocytopenia with platelet count 86,000      All the records are reviewed and case discussed with Care Management/Social Workerr. Management plans discussed with the patient, mom at bedside.  Verbalized understanding of the plan CODE STATUS: Full Code  TOTAL TIME TAKING CARE OF THIS PATIENT: 28 minutes.   POSSIBLE D/C IN 1-2 DAYS, DEPENDING ON CLINICAL CONDITION.   Ramonita LabAruna Sire Poet M.D on 11/14/2017 at 6:35 PM  Between 7am to 6pm - Pager - [phone]  After 6pm go to [email or web address] - password [email or web address]  [facility]  Office  [phone]  CC: Primary care physician; Dortha KernBliss, Laura K, MD

## 2017-11-14 NOTE — Progress Notes (Addendum)
Pt is putting on her shoes and states she is going home. She states "you holding me hostage at your house", attempted to reorient. Support given, pt sitting on side of bed. She still refuses for me to restart her IVF at this time.

## 2017-11-14 NOTE — Progress Notes (Signed)
Brandy Repressohini [name], MD [address]  [address]  [place], [place] [address]  Main: [phone]  Fax: [phone] Pager: [phone]   Subjective: Feeling better compared to yesterday. She had several bowel movements. Tolerating diet well  Objective: Vital signs in last 24 hours: Vitals:   11/14/17 0031 11/14/17 0434 11/14/17 1309 11/14/17 1311  BP: 106/71 102/68 (!) 88/44 108/68  Pulse: (!) 114 (!) 110 94 93  Resp: 20 20 18    Temp: 97.9 F (36.6 C) (!) 97.4 F (36.3 C) (!) 97.5 F (36.4 C)   TempSrc: Oral Oral Oral   SpO2: 99% 100% 98%   Weight:      Height:       Weight change:   Intake/Output Summary (Last 24 hours) at 11/14/2017 1824 Last data filed at 11/14/2017 0035 Gross per 24 hour  Intake 236 ml  Output -  Net 236 ml     Exam: Heart:: Regular rate and rhythm or S1S2 present Lungs: clear to auscultation Abdomen: soft, nontender, normal bowel sounds   Lab Results: CMP Latest Ref Rng & Units 11/14/2017 11/12/2017 11/11/2017  Glucose 65 - 99 mg/dL - 518(A103(H) 416(S113(H)  BUN 6 - 20 mg/dL - 06(T22(H) 01(S21(H)  Creatinine 0.44 - 1.00 mg/dL - 0.10(X1.30(H) 3.23(F1.22(H)  Sodium 135 - 145 mmol/L - 136 134(L)  Potassium 3.5 - 5.1 mmol/L - 4.0 4.1  Chloride 101 - 111 mmol/L - 110 106  CO2 22 - 32 mmol/L - 20(L) 22  Calcium 8.9 - 10.3 mg/dL - 8.5(L) 8.6(L)  Total Protein 6.5 - 8.1 g/dL 6.4(L) 5.8(L) -  Total Bilirubin 0.3 - 1.2 mg/dL 6.4(H) 8.6(H) -  Alkaline Phos 38 - 126 U/L 65 60 -  AST 15 - 41 U/L 82(H) 70(H) -  ALT 14 - 54 U/L 25 21 BLOOD   CBC Latest Ref Rng & Units 11/14/2017 11/13/2017 11/12/2017  WBC 3.6 - 11.0 K/uL 5.5 2.8(L) 3.7  Hemoglobin 12.0 - 16.0 g/dL 5.7(D9.3(L) 2.2(G9.2(L) 2.5(K9.4(L)  Hematocrit 35.0 - 47.0 % 26.0(L) 26.4(L) 27.2(L)  Platelets 150 - 440 K/uL 86(L) 74(L) 72(L)   Micro Results: Recent Results (from the past 240 hour(s))  Wet prep, genital     Status: Abnormal   Collection Time: 10/31/2017  8:31 AM  Result Value Ref Range Status   Yeast Wet Prep HPF POC  NONE SEEN NONE SEEN Final   Trich, Wet Prep NONE SEEN NONE SEEN Final   Clue Cells Wet Prep HPF POC PRESENT (A) NONE SEEN Final   WBC, Wet Prep HPF POC MODERATE (A) NONE SEEN Final   Sperm NONE SEEN  Final    Comment: Performed at [facility], [address] [place], [place] [address]  Chlamydia/NGC rt PCR [facility] only)     Status: None   Collection Time: 11/17/2017  8:31 AM  Result Value Ref Range Status   Specimen source GC/Chlam CHLAMYDIA SPECIES  Final   Chlamydia Tr NOT DETECTED NOT DETECTED Final   N gonorrhoeae NOT DETECTED NOT DETECTED Final    Comment: (NOTE) 100  This methodology has not been evaluated in pregnant women or in 200  patients with a history of hysterectomy. 300 400  This methodology will not be performed on patients less than 6214  years of age. Performed at [facility], [address] [place], [place] [address]    Studies/Results: No results found. Medications: I have reviewed the patient's current medications. Scheduled Meds: . ALPRAZolam  2 mg Oral QHS  .  buPROPion  300 mg Oral Daily  . feeding supplement (ENSURE ENLIVE)  237 mL Oral BID BM  . folic acid  1 mg Oral Daily  . lactulose  30 g Oral TID  . lidocaine  1 patch Transdermal Q24H  . multivitamin-lutein  1 capsule Oral Daily  . neomycin-bacitracin-polymyxin   Topical TID  . nystatin   Topical TID  . phytonadione  10 mg Oral Daily  . polyethylene glycol  17 g Oral BID  . predniSONE  40 mg Oral Daily  . rifaximin  550 mg Oral BID  . sodium chloride flush  3 mL Intravenous Q12H  . thiamine  100 mg Oral Daily   Continuous Infusions: . sodium chloride    . sodium chloride Stopped (11/13/17 2027)   PRN Meds:.sodium chloride, alum & mag hydroxide-simeth, diazepam, ondansetron **OR** ondansetron (ZOFRAN) IV, oxyCODONE-acetaminophen, sodium chloride flush   Assessment: Principal Problem:   Subacute delirium Active Problems:   Alcohol abuse   ARF (acute renal failure)  (HCC)  Acute alcoholic hepatitis with alcoholic liver failure: encephalopathy, coagulopathy and hyperbilirubinemia Macrocytic anemia secondary to folate deficiency and alcoholic liver disease  Plan: Started on prednisone on 11/13/2017, continue 40 mg daily Calculate [name]'s score on day 7 to decide if she is responding to steroids Monitor daily LFTs, PT/ INR Continue lactulose, MiraLAX and rifaximin, goal Bms 4-5 per day Recommend nutrition consult to discuss about high protein diet Daily folate acid 1 mg to treat folate deficiency Recommend vitamin K 10 mg daily  Overall prognosis is guarded. We'll follow along with you   LOS: 8 days   Brandy Cooper 11/14/2017, 6:24 PM

## 2017-11-15 ENCOUNTER — Inpatient Hospital Stay: Payer: BLUE CROSS/BLUE SHIELD

## 2017-11-15 ENCOUNTER — Inpatient Hospital Stay: Admit: 2017-11-15 | Payer: BLUE CROSS/BLUE SHIELD

## 2017-11-15 DIAGNOSIS — N179 Acute kidney failure, unspecified: Secondary | ICD-10-CM

## 2017-11-15 DIAGNOSIS — K709 Alcoholic liver disease, unspecified: Secondary | ICD-10-CM

## 2017-11-15 LAB — CBC
HCT: 24.7 % — ABNORMAL LOW (ref 35.0–47.0)
Hemoglobin: 8.4 g/dL — ABNORMAL LOW (ref 12.0–16.0)
MCH: 37.1 pg — ABNORMAL HIGH (ref 26.0–34.0)
MCHC: 34.1 g/dL (ref 32.0–36.0)
MCV: 108.8 fL — AB (ref 80.0–100.0)
PLATELETS: 78 10*3/uL — AB (ref 150–440)
RBC: 2.27 MIL/uL — AB (ref 3.80–5.20)
RDW: 24.3 % — AB (ref 11.5–14.5)
WBC: 3.5 10*3/uL — ABNORMAL LOW (ref 3.6–11.0)

## 2017-11-15 LAB — HEPATIC FUNCTION PANEL
ALT: 29 U/L (ref 14–54)
AST: 109 U/L — ABNORMAL HIGH (ref 15–41)
Albumin: 2.6 g/dL — ABNORMAL LOW (ref 3.5–5.0)
Alkaline Phosphatase: 63 U/L (ref 38–126)
BILIRUBIN DIRECT: 2.5 mg/dL — AB (ref 0.1–0.5)
BILIRUBIN INDIRECT: 2.5 mg/dL — AB (ref 0.3–0.9)
TOTAL PROTEIN: 6 g/dL — AB (ref 6.5–8.1)
Total Bilirubin: 5 mg/dL — ABNORMAL HIGH (ref 0.3–1.2)

## 2017-11-15 LAB — PROTIME-INR
INR: 2.78
Prothrombin Time: 29.1 seconds — ABNORMAL HIGH (ref 11.4–15.2)

## 2017-11-15 LAB — BASIC METABOLIC PANEL
Anion gap: 6 (ref 5–15)
BUN: 35 mg/dL — ABNORMAL HIGH (ref 6–20)
CALCIUM: 8.8 mg/dL — AB (ref 8.9–10.3)
CO2: 20 mmol/L — ABNORMAL LOW (ref 22–32)
CREATININE: 2.59 mg/dL — AB (ref 0.44–1.00)
Chloride: 113 mmol/L — ABNORMAL HIGH (ref 101–111)
GFR, EST AFRICAN AMERICAN: 25 mL/min — AB (ref >60)
GFR, EST NON AFRICAN AMERICAN: 22 mL/min — AB (ref >60)
Glucose, Bld: 139 mg/dL — ABNORMAL HIGH (ref 65–99)
Potassium: 3.7 mmol/L (ref 3.5–5.1)
SODIUM: 139 mmol/L (ref 135–145)

## 2017-11-15 LAB — PROTEIN / CREATININE RATIO, URINE
Creatinine, Urine: 294 mg/dL
Protein Creatinine Ratio: 0.32 mg/mg{Cre} — ABNORMAL HIGH (ref 0.00–0.15)
Total Protein, Urine: 93 mg/dL

## 2017-11-15 LAB — SODIUM, URINE, RANDOM: Sodium, Ur: <10 mmol/L

## 2017-11-15 LAB — AMMONIA: AMMONIA: 117 umol/L — AB (ref 9–35)

## 2017-11-15 MED ORDER — LACTULOSE 10 GM/15ML PO SOLN
20.0000 g | Freq: Three times a day (TID) | ORAL | Status: DC
  Administered 2017-11-15 – 2017-11-17 (×5): 20 g via ORAL
  Filled 2017-11-15 (×5): qty 30

## 2017-11-15 MED ORDER — ALBUMIN HUMAN 25 % IV SOLN
25.0000 g | Freq: Three times a day (TID) | INTRAVENOUS | Status: DC
  Administered 2017-11-15 – 2017-11-19 (×12): 25 g via INTRAVENOUS
  Filled 2017-11-15 (×14): qty 100

## 2017-11-15 MED ORDER — SODIUM CHLORIDE 0.9 % IV SOLN
INTRAVENOUS | Status: DC
  Administered 2017-11-15 – 2017-11-18 (×3): via INTRAVENOUS

## 2017-11-15 NOTE — Progress Notes (Signed)
Brandy Repress, MD [address]  [address]  [place], [place] [address]  Main: [phone]  Fax: [phone] Pager: [phone]   Subjective: Feeling better compared to yesterday. She had several bowel movements. Tolerating diet well. Decreasing urine output and worsening creatinine, nephrology is consulted, receiving IV albumin.   Objective: Vital signs in last 24 hours: Vitals:   11/14/17 2047 11/15/17 0408 11/15/17 1154 11/15/17 1156  BP: 101/62 (!) 97/57 (!) 89/57 (!) 106/51  Pulse: 92 87 94 93  Resp: 18 18    Temp:      TempSrc:      SpO2: 100% 100% 100% 100%  Weight:      Height:       Weight change:  No intake or output data in the 24 hours ending 11/15/17 1719   Exam: Appears less jaundiced Heart:: Regular rate and rhythm or S1S2 present Lungs: clear to auscultation Abdomen: soft, nontender, normal bowel sounds   Lab Results: CMP Latest Ref Rng & Units Cooper 11/14/2017 11/12/2017  Glucose 65 - 99 mg/dL 962(X) - 528(U)  BUN 6 - 20 mg/dL 13(K) - 44(W)  Creatinine 0.44 - 1.00 mg/dL 1.02(V) - 2.53(G)  Sodium 135 - 145 mmol/L 139 - 136  Potassium 3.5 - 5.1 mmol/L 3.7 - 4.0  Chloride 101 - 111 mmol/L 113(H) - 110  CO2 22 - 32 mmol/L 20(L) - 20(L)  Calcium 8.9 - 10.3 mg/dL 6.4(Q) - 8.5(L)  Total Protein 6.5 - 8.1 g/dL 6.0(L) 6.4(L) 5.8(L)  Total Bilirubin 0.3 - 1.2 mg/dL 5.0(H) 6.4(H) 8.6(H)  Alkaline Phos 38 - 126 U/L 63 65 60  AST 15 - 41 U/L 109(H) 82(H) 70(H)  ALT 14 - 54 U/L 29 25 21    CBC Latest Ref Rng & Units Cooper 11/14/2017 11/13/2017  WBC 3.6 - 11.0 K/uL 3.5(L) 5.5 2.8(L)  Hemoglobin 12.0 - 16.0 g/dL 0.3(K) 7.4(Q) 5.9(D)  Hematocrit 35.0 - 47.0 % 24.7(L) 26.0(L) 26.4(L)  Platelets 150 - 440 K/uL 78(L) 86(L) 74(L)   Micro Results: Recent Results (from the past 240 hour(s))  Wet prep, genital     Status: Abnormal   Collection Time: 11/19/2017  8:31 AM  Result Value Ref Range Status   Yeast Wet Prep HPF POC NONE SEEN NONE SEEN  Final   Trich, Wet Prep NONE SEEN NONE SEEN Final   Clue Cells Wet Prep HPF POC PRESENT (A) NONE SEEN Final   WBC, Wet Prep HPF POC MODERATE (A) NONE SEEN Final   Sperm NONE SEEN  Final    Comment: Performed at [facility], [address], [place], [place] [address]  Chlamydia/NGC rt PCR [facility] only)     Status: None   Collection Time: 11/03/2017  8:31 AM  Result Value Ref Range Status   Specimen source GC/Chlam CHLAMYDIA SPECIES  Final   Chlamydia Tr NOT DETECTED NOT DETECTED Final   N gonorrhoeae NOT DETECTED NOT DETECTED Final    Comment: (NOTE) 100  This methodology has not been evaluated in pregnant women or in 200  patients with a history of hysterectomy. 300 400  This methodology will not be performed on patients less than 19  years of age. Performed at [facility], [address], [place], [place] [address]    Studies/Results: US Renal  Result Date: Cooper CLINICAL DATA:  Acute kidney injury. EXAM: RENAL / URINARY TRACT ULTRASOUND COMPLETE COMPARISON:  11/09/2017. FINDINGS: Right Kidney: Length: 10.4 cm. Echogenicity within normal limits. No mass or hydronephrosis  visualized. Left Kidney: Length: 10.4 cm. Again demonstrated is diffuse cortical thinning with prominent renal sinus fat. Normal parenchymal echotexture. No hydronephrosis. The previously demonstrated exophytic lower pole cyst was not imaged today. Bladder: Not visualized. The examination was limited by body habitus and lack of bladder distention. IMPRESSION: 1. No acute abnormality. 2. Stable mild diffuse left renal cortical atrophy. Electronically Signed   By: Brandy SaltsSteven  Cooper M.D.   On: 11/15/2017 10:58   Medications: I have reviewed the patient's current medications. Scheduled Meds: . ALPRAZolam  2 mg Oral QHS  . buPROPion  300 mg Oral Daily  . feeding supplement (ENSURE ENLIVE)  237 mL Oral BID BM  . folic acid  1 mg Oral Daily  . lactulose  20 g Oral TID  . lidocaine  1 patch Transdermal  Q24H  . multivitamin-lutein  1 capsule Oral Daily  . neomycin-bacitracin-polymyxin   Topical TID  . nystatin   Topical TID  . phytonadione  10 mg Oral Daily  . polyethylene glycol  17 g Oral BID  . predniSONE  40 mg Oral Daily  . rifaximin  550 mg Oral BID  . sodium chloride flush  3 mL Intravenous Q12H  . thiamine  100 mg Oral Daily   Continuous Infusions: . sodium chloride    . sodium chloride 50 mL/hr at 11/15/17 1223  . albumin human     PRN Meds:.sodium chloride, alum & mag hydroxide-simeth, diazepam, ondansetron **OR** ondansetron (ZOFRAN) IV, oxyCODONE-acetaminophen, sodium chloride flush   Assessment: Principal Problem:   Subacute delirium Active Problems:   Alcohol abuse   ARF (acute renal failure) (HCC) laparoscopic sleeve gastrectomy on 04/08/2011 and had some weight regain. 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   Acute alcoholic hepatitis with alcoholic liver failure: encephalopathy, coagulopathy and hyperbilirubinemia Macrocytic anemia secondary to folate deficiency and alcoholic liver disease AKI prerenal vs hepatorenal syndrome  Plan: Started on prednisone on 11/13/2017, continue 40 mg daily Calculate [name]'s score on day 7 to decide if she is responding to steroids Monitor LFTs Continue lactulose and rifaximin Appreciate nutron recommendations Daily folate acid 1 mg to treat folate deficiency continue vitamin K 10 mg daily Nephrology is on board for management of acute kidney injury  Clinically improving slowly, We'll follow along with you  Dr Tobi BastosAnna to cover from tomorrow   LOS: 9 days   Brandy Cooper, 5:19 PM

## 2017-11-15 NOTE — Progress Notes (Addendum)
[facility] [place] [facility]   PATIENT NAME: Brandy Cooper    MR#:  [id number]  DATE OF BIRTH:  1976/01/25  SUBJECTIVE:  CHIEF COMPLAINT:   Chief Complaint  Patient presents with  . Vaginal Bleeding  Patient is more awake and alert and feeling better no family members at bedside REVIEW OF SYSTEMS:  Review of system unobtainable DRUG ALLERGIES:   Allergies  Allergen Reactions  . Celebrex [Celecoxib] Hives  . Morphine And Related Itching    VITALS:  Blood pressure (!) 97/57, pulse 87, temperature (!) 97.5 F (36.4 C), temperature source Oral, resp. rate 18, height 5\' 6"  (1.676 m), weight 116 kg (255 lb 11.7 oz), SpO2 100 %.  PHYSICAL EXAMINATION:  Physical Exam  GENERAL:  42 y.o.-year-old patient lying in the bed with no acute distress.  EYES: Pupils equal, round, reactive to light and accommodation.  Has scleral icterus. Extraocular muscles intact. Icteric sclerae HEENT: Head atraumatic, normocephalic. Oropharynx and nasopharynx clear.  NECK:  Supple, no jugular venous distention. No thyroid enlargement, no tenderness.  LUNGS: Normal breath sounds bilaterally, no wheezing, rales,rhonchi or crepitation. No use of accessory muscles of respiration. Decreased bibasilar breath sounds CARDIOVASCULAR: S1, S2 normal. No murmurs, rubs, or gallops.  ABDOMEN: Soft, obese, nontender, nondistended. Bowel sounds present. No organomegaly or mass.  EXTREMITIES: No pedal edema, cyanosis, or clubbing.  NEUROLOGIC: Cranial nerves II through XII are intact. Muscle strength 5/5 in all extremities. Sensation intact. Gait not checked.  Global weakness noted PSYCHIATRIC: The patient is alert and oriented x 3 SKIN: No obvious rash, lesion, or ulcer.    LABORATORY PANEL:   CBC Recent Labs  Lab 11/15/17 0507  WBC 3.5*  HGB 8.4*  HCT 24.7*  PLT 78*    ------------------------------------------------------------------------------------------------------------------  Chemistries  Recent Labs  Lab 11/15/17 0507  NA 139  K 3.7  CL 113*  CO2 20*  GLUCOSE 139*  BUN 35*  CREATININE 2.59*  CALCIUM 8.8*  AST 109*  ALT 29  ALKPHOS 63  BILITOT 5.0*   ------------------------------------------------------------------------------------------------------------------  Cardiac Enzymes No results for input(s): TROPONINI in the last 168 hours. ------------------------------------------------------------------------------------------------------------------  RADIOLOGY:  [place] Renal  Result Date: 11/15/2017 CLINICAL DATA:  Acute kidney injury. EXAM: RENAL / URINARY TRACT ULTRASOUND COMPLETE COMPARISON:  11/09/2017. FINDINGS: Right Kidney: Length: 10.4 cm. Echogenicity within normal limits. No mass or hydronephrosis visualized. Left Kidney: Length: 10.4 cm. Again demonstrated is diffuse cortical thinning with prominent renal sinus fat. Normal parenchymal echotexture. No hydronephrosis. The previously demonstrated exophytic lower pole cyst was not imaged today. Bladder: Not visualized. The examination was limited by body habitus and lack of bladder distention. IMPRESSION: 1. No acute abnormality. 2. Stable mild diffuse left renal cortical atrophy. Electronically Signed   By: Beckie SaltsSteven  Reid M.D.   On: 11/15/2017 10:58    EKG:   Orders placed or performed during the hospital encounter of 11/14/2017  . ED EKG  . ED EKG  . EKG 12-Lead  . EKG 12-Lead  . EKG    ASSESSMENT AND PLAN:   Brandy Bossngela Pasko  is a 42 y.o. female with a known history of anxiety and depression, status post laparoscopic gastric sleeve resection, dysfunctional uterine bleeding presents to hospital secondary to a fall.  #. 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 -Patient was counseled to stop drinking alcohol and  she will be benefited with outpatient alcohol Anonymous -continue lactulose and xifaxan -patient is alert and tolerating medications  by mouth this afternoon as reported by the RN - ammonia still elevated at 117 (was [phone]), -?  Is this her new baseline ammonia -GI following -IV albumin - EGD as outpt - BP is low to add any lasix or aldactone -Outpatient follow-up with gastroenterology as recommended for possible biopsy to know the staging of alcoholic liver cirrhosis  2.  Acute renal failure-  probably hepatorenal syndrome -Renal ultrasound-normal -Renal function is getting worse creatinine 1.3-2.59 Monitor intake and output- Nephrology consulted - could be hepatorenal, with new diagnosis of cirrhosis -continue lasix and aldactone once BP improves -Hold metformin and other nephrotoxins  3.  Alcoholic hepatitis and IgM CMV positive CMV PCR sent ID recommended steroids for alcoholic hepatitis.  Patient is started on prednisolone 40 mg once daily will continue for 28 days and taper and a course of 2 weeks  4.  Depression/anxiety/subs abuse-appreciate psych input - continue the Wellbutrin & Xanax.     5. Bacterial vaginosis/intertrigo- flagyl 500mg  bid for 7 days course completed Clinically improving Nystatin powder Out of bed as tolerated  6.  Dysfunctional uterine bleeding-going on for a few months, had a left ovarian cyst based on pelvic ultrasound 2 months ago. -Missed recent follow-up visits and increased bleeding with worsening anemia. -Appreciate OB/GYN consult-they have recommended outpatient follow-up since no active bleeding at this time  7.  DVT prophylaxis-teds and SCDs.  Hold off on heparin products due to bleeding and thrombocytopenia with platelet count 78,000   Physical therapy is recommending home health PT   All the records are reviewed and case discussed with Care Management/Social Workerr. Management plans discussed with the patient, mom at bedside.   Verbalized understanding of the plan CODE STATUS: Full Code  TOTAL TIME TAKING CARE OF THIS PATIENT: 33 minutes.   POSSIBLE D/C IN 1-2 DAYS, DEPENDING ON CLINICAL CONDITION.   Ramonita Lab M.D on 11/15/2017 at 11:51 AM  Between 7am to 6pm - Pager - [phone]  After 6pm go to [email or web address] - password [email or web address]  [facility]  Office  [phone]  CC: Primary care physician; Dortha Kern, MD

## 2017-11-15 NOTE — Progress Notes (Signed)
16Fr foley placed aseptically, pt tol well.  Awaiting IV Team to put an IV in pt then will start IVF and Albumin.

## 2017-11-15 NOTE — Consult Note (Signed)
[facility] CONSULT NOTE    Date: 11/15/2017                  Patient Name:  Brandy Cooper  MRN: [id number]  DOB: 1975-11-27  Age / Sex: 42 y.o., female         PCP: Dortha Kern, MD                 Service Requesting Consult: Hospitalist                 Reason for Consult: Acute renal failure            History of Present Illness: Patient is a 42 y.o. female with a PMHx of anxiety, bipolar disorder, depression, alcohol abuse, who [facility] for evaluation of vaginal bleeding as well as recent fall.  Patient apparently had an IUD in place.  She was having intermittent vaginal bleeding at home.  She was seen at [facility] for this issue previously.  We are asked to see her for acute renal failure.  On May 20, 2017 creatinine was normal at 0.68.  When she was admitted creatinine was up to 1.75.  Today creatinine is up to 2.59.  The patient has had relative periods of hypotension as well.  Renal ultrasound was performed and patient was found to have diffuse left renal cortical atrophy.  Patient also found to have hepatic steatosis noted on MRCP.  Patient is also been seen by gastroenterology for alcoholic hepatitis.  She has been started on prednisone for this issue as well.   Medications: Outpatient medications: Medications Prior to Admission  Medication Sig Dispense Refill Last Dose  . ALPRAZolam (XANAX) 1 MG tablet Take 2 mg by mouth at bedtime.    11/05/2017 at 2000  . buPROPion (WELLBUTRIN XL) 300 MG 24 hr tablet Take 300 mg by mouth daily.   unknown at unknown  . metFORMIN (GLUCOPHAGE) 500 MG tablet Take 1,500 mg by mouth daily.    unknown at unknown  . valACYclovir (VALTREX) 500 MG tablet Take 500 mg by mouth once. As needed   unknown at unknown  . cephALEXin (KEFLEX) 500 MG capsule Take 1 capsule (500 mg total) by mouth every 12 (twelve) hours. (Patient not taking: Reported on 11/01/2017) 10 capsule 0 Not Taking at Unknown time   . diazepam (VALIUM) 5 MG tablet Take 1 tablet (5 mg total) by mouth every 8 (eight) hours as needed for muscle spasms. 20 tablet 0 prn at prn    Current medications: Current Facility-Administered Medications  Medication Dose Route Frequency Provider Last Rate Last Dose  . 0.9 %  sodium chloride infusion  250 mL Intravenous PRN Enid Baas, MD      . 0.9 %  sodium chloride infusion   Intravenous Continuous Tashena Ibach, MD 50 mL/hr at 11/15/17 1223    . albumin human 25 % solution 25 g  25 g Intravenous Q8H Raysean Graumann, MD   25 g at 11/15/17 1222  . ALPRAZolam Prudy Feeler) tablet 2 mg  2 mg Oral QHS Enid Baas, MD   2 mg at 11/14/17 2142  . alum & mag hydroxide-simeth (MAALOX/MYLANTA) 200-200-20 MG/5ML suspension 30 mL  30 mL Oral Q6H PRN Enid Baas, MD   30 mL at 11/08/17 2247  . buPROPion (WELLBUTRIN XL) 24 hr tablet 300 mg  300 mg Oral Daily Enid Baas, MD   300 mg at 11/15/17 1003  . diazepam (VALIUM)  tablet 5 mg  5 mg Oral Q8H PRN Enid BaasKalisetti, Radhika, MD   5 mg at 11/14/17 0113  . feeding supplement (ENSURE ENLIVE) (ENSURE ENLIVE) liquid 237 mL  237 mL Oral BID BM Toney ReilVanga, Rohini Reddy, MD   237 mL at 11/15/17 0956  . folic acid (FOLVITE) tablet 1 mg  1 mg Oral Daily Enid BaasKalisetti, Radhika, MD   1 mg at 11/15/17 1004  . lactulose (CHRONULAC) 10 GM/15ML solution 30 g  30 g Oral TID Gouru, Aruna, MD   30 g at 11/15/17 0958  . lidocaine (LIDODERM) 5 % 1 patch  1 patch Transdermal Q24H Enid BaasKalisetti, Radhika, MD   1 patch at 11/15/17 0958  . multivitamin-lutein (OCUVITE-LUTEIN) capsule 1 capsule  1 capsule Oral Daily Gouru, Aruna, MD   1 capsule at 11/13/17 1848  . neomycin-bacitracin-polymyxin (NEOSPORIN) ointment   Topical TID Gouru, Aruna, MD      . nystatin (MYCOSTATIN/NYSTOP) topical powder   Topical TID Gouru, Aruna, MD      . ondansetron (ZOFRAN) tablet 4 mg  4 mg Oral Q6H PRN Enid BaasKalisetti, Radhika, MD       Or  . ondansetron (ZOFRAN) injection 4 mg  4 mg Intravenous  Q6H PRN Enid BaasKalisetti, Radhika, MD      . oxyCODONE-acetaminophen (PERCOCET/ROXICET) 5-325 MG per tablet 1 tablet  1 tablet Oral Q6H PRN Enid BaasKalisetti, Radhika, MD   1 tablet at 11/15/17 0135  . phytonadione (VITAMIN K) tablet 10 mg  10 mg Oral Daily Toney ReilVanga, Rohini Reddy, MD   10 mg at 11/15/17 1001  . polyethylene glycol (MIRALAX / GLYCOLAX) packet 17 g  17 g Oral BID Toney ReilVanga, Rohini Reddy, MD   17 g at 11/14/17 2142  . predniSONE (DELTASONE) tablet 40 mg  40 mg Oral Daily Gouru, Aruna, MD   40 mg at 11/15/17 1004  . rifaximin (XIFAXAN) tablet 550 mg  550 mg Oral BID Enid BaasKalisetti, Radhika, MD   550 mg at 11/15/17 1003  . sodium chloride flush (NS) 0.9 % injection 3 mL  3 mL Intravenous Q12H Enid BaasKalisetti, Radhika, MD   3 mL at 11/13/17 0915  . sodium chloride flush (NS) 0.9 % injection 3 mL  3 mL Intravenous PRN Enid BaasKalisetti, Radhika, MD      . thiamine (VITAMIN B-1) tablet 100 mg  100 mg Oral Daily Toney ReilVanga, Rohini Reddy, MD   100 mg at 11/15/17 1006      Allergies: Allergies  Allergen Reactions  . Celebrex [Celecoxib] Hives  . Morphine And Related Itching      Past Medical History: Past Medical History:  Diagnosis Date  . Anxiety   . Bipolar affect, depressed (HCC)   . Depression      Past Surgical History: Past Surgical History:  Procedure Laterality Date  . CHOLECYSTECTOMY    . LAPAROSCOPIC GASTRIC SLEEVE RESECTION    . TONSILLECTOMY       Family History: Family History  Family history unknown: Yes     Social History: Social History   Socioeconomic History  . Marital status: Legally Separated    Spouse name: Not on file  . Number of children: Not on file  . Years of education: Not on file  . Highest education level: Not on file  Occupational History  . Not on file  Social Needs  . Financial resource strain: Not on file  . Food insecurity:    Worry: Not on file    Inability: Not on file  . Transportation needs:    Medical: Not  on file    Non-medical: Not on file  Tobacco  Use  . Smoking status: Former Smoker    Types: Cigarettes  . Smokeless tobacco: Never Used  . Tobacco comment: quit since 2 months  Substance and Sexual Activity  . Alcohol use: No  . Drug use: No  . Sexual activity: Not on file  Lifestyle  . Physical activity:    Days per week: Not on file    Minutes per session: Not on file  . Stress: Not on file  Relationships  . Social connections:    Talks on phone: Not on file    Gets together: Not on file    Attends religious service: Not on file    Active member of club or organization: Not on file    Attends meetings of clubs or organizations: Not on file    Relationship status: Not on file  . Intimate partner violence:    Fear of current or ex partner: Not on file    Emotionally abused: Not on file    Physically abused: Not on file    Forced sexual activity: Not on file  Other Topics Concern  . Not on file  Social History Narrative   Lives by herself, unsteady gait, ambulates without any assisted device     Review of Systems: Review of Systems  Constitutional: Positive for malaise/fatigue. Negative for chills and fever.  HENT: Negative for congestion, hearing loss and tinnitus.   Eyes: Negative for blurred vision and double vision.  Respiratory: Negative for cough, hemoptysis and sputum production.   Cardiovascular: Negative for chest pain, palpitations and orthopnea.  Gastrointestinal: Negative for heartburn, nausea and vomiting.  Genitourinary: Negative for dysuria, frequency and urgency.  Musculoskeletal: Positive for falls.  Skin: Negative for itching and rash.  Neurological: Positive for weakness. Negative for dizziness.  Endo/Heme/Allergies: Negative for polydipsia. Does not bruise/bleed easily.  Psychiatric/Behavioral: Positive for depression. The patient is not nervous/anxious.      Vital Signs: Blood pressure (!) 106/51, pulse 93, temperature (!) 97.5 F (36.4 C), temperature source Oral, resp. rate 18, height  5\' 6"  (1.676 m), weight 116 kg (255 lb 11.7 oz), SpO2 100 %.  Weight trends: Filed Weights   11/21/2017 0751 10/31/2017 1426  Weight: 113.4 kg (250 lb) 116 kg (255 lb 11.7 oz)    Physical Exam: General: NAD, laying in bed  Head: Normocephalic, atraumatic.  Eyes: Mild icterus, EOMI  Nose: Mucous membranes moist, not inflammed, nonerythematous.  Throat: Oropharynx nonerythematous, no exudate appreciated.   Neck: Supple, trachea midline.  Lungs:  Normal respiratory effort. Clear to auscultation BL without crackles or wheezes.  Heart: RRR. S1 and S2 normal without gallop, murmur, or rubs.  Abdomen:  BS normoactive. Soft, Nondistended, non-tender.  No masses or organomegaly.  Extremities: No pretibial edema.  Neurologic: A&O X3, Motor strength is 5/5 in the all 4 extremities  Skin: No visible rashes, scars.    Lab results: Basic Metabolic Panel: Recent Labs  Lab 11/11/17 0507 11/12/17 0459 11/15/17 0507  NA 134* 136 139  K 4.1 4.0 3.7  CL 106 110 113*  CO2 22 20* 20*  GLUCOSE 113* 103* 139*  BUN 21* 22* 35*  CREATININE 1.22* 1.30* 2.59*  CALCIUM 8.6* 8.5* 8.8*    Liver Function Tests: Recent Labs  Lab 11/12/17 0459 11/14/17 0634 11/15/17 0507  AST 70* 82* 109*  ALT 21 25 29   ALKPHOS 60 65 63  BILITOT 8.6* 6.4* 5.0*  PROT 5.8* 6.4* 6.0*  ALBUMIN 2.3* 2.8* 2.6*   No results for input(s): LIPASE, AMYLASE in the last 168 hours. Recent Labs  Lab 11/13/17 0521 11/14/17 1022 11/15/17 0858  AMMONIA 102* 130* 117*    CBC: Recent Labs  Lab 11/11/17 0507 11/12/17 0459 11/13/17 0530 11/14/17 0634 11/15/17 0507  WBC 3.2* 3.7 2.8* 5.5 3.5*  HGB 9.6* 9.4* 9.2* 9.3* 8.4*  HCT 27.9* 27.2* 26.4* 26.0* 24.7*  MCV 107.0* 107.9* 108.1* 108.6* 108.8*  PLT 74* 72* 74* 86* 78*    Cardiac Enzymes: No results for input(s): CKTOTAL, CKMB, CKMBINDEX, TROPONINI in the last 168 hours.  BNP: Invalid input(s): POCBNP  CBG: No results for input(s): GLUCAP in the last 168  hours.  Microbiology: Results for orders placed or performed during the hospital encounter of November 23, 2017  Wet prep, genital     Status: Abnormal   Collection Time: Nov 23, 2017  8:31 AM  Result Value Ref Range Status   Yeast Wet Prep HPF POC NONE SEEN NONE SEEN Final   Trich, Wet Prep NONE SEEN NONE SEEN Final   Clue Cells Wet Prep HPF POC PRESENT (A) NONE SEEN Final   WBC, Wet Prep HPF POC MODERATE (A) NONE SEEN Final   Sperm NONE SEEN  Final    Comment: Performed at [facility], [address] [place], [place] [address]  Chlamydia/NGC rt PCR [facility] only)     Status: None   Collection Time: Nov 23, 2017  8:31 AM  Result Value Ref Range Status   Specimen source GC/Chlam CHLAMYDIA SPECIES  Final   Chlamydia Tr NOT DETECTED NOT DETECTED Final   N gonorrhoeae NOT DETECTED NOT DETECTED Final    Comment: (NOTE) 100  This methodology has not been evaluated in pregnant women or in 200  patients with a history of hysterectomy. 300 400  This methodology will not be performed on patients less than 65  years of age. Performed at [facility], [address] [place], [place] [address]     Coagulation Studies: Recent Labs    11/14/17 0634 11/15/17 0507  LABPROT 32.8* 29.1*  INR 3.24 2.78    Urinalysis: No results for input(s): COLORURINE, LABSPEC, PHURINE, GLUCOSEU, HGBUR, BILIRUBINUR, KETONESUR, PROTEINUR, UROBILINOGEN, NITRITE, LEUKOCYTESUR in the last 72 hours.  Invalid input(s): APPERANCEUR    Imaging: US Renal  Result Date: 11/15/2017 CLINICAL DATA:  Acute kidney injury. EXAM: RENAL / URINARY TRACT ULTRASOUND COMPLETE COMPARISON:  11/09/2017. FINDINGS: Right Kidney: Length: 10.4 cm. Echogenicity within normal limits. No mass or hydronephrosis visualized. Left Kidney: Length: 10.4 cm. Again demonstrated is diffuse cortical thinning with prominent renal sinus fat. Normal parenchymal echotexture. No hydronephrosis. The previously demonstrated exophytic lower pole cyst  was not imaged today. Bladder: Not visualized. The examination was limited by body habitus and lack of bladder distention. IMPRESSION: 1. No acute abnormality. 2. Stable mild diffuse left renal cortical atrophy. Electronically Signed   By: Beckie Salts M.D.   On: 11/15/2017 10:58      Assessment & Plan: Pt is a 42 y.o. female   with a PMHx of anxiety, bipolar disorder, depression, alcohol abuse, who was admitted to [facility] on 11-23-2017 for evaluation of vaginal bleeding as well as recent fall.   1.  Acute renal failure.  2.  Hepatic steatosis/Alcoholic hepatitis.  3.  Hepatic encephalopathy.   Plan:  We are asked to see the patient for evaluation management of acute renal failure.  Her creatinine is up to 2.59.  She has hepatic steatosis as well as alcoholic  hepatitis at the moment.  Suspect that her acute renal failure is related.  Continue IV fluid hydration but we will decrease 0.9 normal saline to 50 cc/h.  We will also add albumin 25 g IV every 8 hours.  Renal ultrasound was negative for hydronephrosis but there was mild diffuse left renal cortical atrophy which is likely chronic in nature.  No urgent indication for dialysis at the moment however we will need to evaluate the patient for this possibility should renal function continued to deteriorate.  Certainly recommend avoiding any further nephrotoxins.  Further plan as patient progresses.

## 2017-11-15 NOTE — Progress Notes (Signed)
Pt has had less than UOp since foley placed few hours ago.  Urine sent to lab for ordered tests.  Having many WATERY yellow stools from chronulac.

## 2017-11-15 NOTE — Discharge Instructions (Signed)
Nutrition Post Hospital Stay Proper nutrition can help your body recover from illness and injury.   Foods and beverages high in protein, vitamins, and minerals help rebuild muscle loss, promote healing, & reduce fall risk.   In addition to eating healthy foods, a nutrition shake is an easy, delicious way to get the nutrition you need during and after your hospital stay  It is recommended that you continue to drink 2 bottles per day of:     Ensure Enlive  Also take a complete bariatric multivitamin with minerals daily.  Tips for adding a nutrition shake into your routine: As allowed, drink one with vitamins or medications instead of water or juice Enjoy one as a tasty mid-morning or afternoon snack Drink cold or make a milkshake out of it Drink one instead of milk with cereal or snacks Use as a coffee creamer   Available at the following grocery stores and pharmacies:           * [employer]  * [employer] * [employer]co  * R[employer] Aid          * Walmart * [employer] Club  * Walgreens                * Target * BJ's   * CVS             * [employer]   * [facility] [phone]            For COUPONS visit: [email or web address]/join or [email or web address]   Suggested Substitutions Ensure Plus = Boost Plus = Carnation Breakfast Essentials = Boost Compact Ensure Active Clear = Boost Breeze Glucerna Shake = Boost Glucose Control = Carnation Breakfast Essentials SUGAR FREE

## 2017-11-15 NOTE — Progress Notes (Signed)
Initial Nutrition Assessment  DOCUMENTATION CODES:   Morbid obesity  INTERVENTION:  Continue Ensure Enlive po BID, each supplement provides 350 kcal and 20 grams of protein.  Continue Magic cup TID with meals, each supplement provides 290 kcal and 9 grams of protein.  Reviewed "High Protein Foods List" from the Academy of Nutrition and Dietetics. Discussed patient's individualized protein needs. Encouraged adequate intake of protein from meals and oral nutrition supplements.  Patient needs to be on a bariatric multivitamin with minerals daily at home. Not carried on formulary here. She also needs her vitamin/mineral labs checked yearly (vitamin B1, vitamin B12, folate, calcium, vitamin A, vitamin E, vitamin K, vitamin D, iron, zinc, copper).  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   -Per review of chart patient actually had a laparoscopic sleeve gastrectomy on 04/08/2011 and had some weight regain. 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   Met with patient at bedside. She has a Air cabin crew in room. She reports she is not eating well at her meals. She was having abdominal pain and diarrhea but reports they are both better now.  She reports that PTA she was "eating normally" but was having some constipation. She reports that she eats 1-2 meals per day, but sometimes those "meals" are just a bottle of Premier Protein (160 kcal, 30 grams of protein).  Per chart patient was 241.3 lbs on 08/06/2016. Highest weight was 439 lbs prior to sleeve gastrectomy in 2012. Limited weight history in chart to trend.  Medications reviewed and include: folic acid 1 mg daily, lactulose 30 grams TID, Ocuvite daily, vitamin K 10 mg daily, Miralax, prednisone 40 mg daily, thiamine 100 mg daily, human albumin 25 grams Q8hrs IV.  Labs reviewed: Chloride 113, CO2 20, BUN 35, Creatinine 2.59, Ammonia 117. Folate was 2.5 on 11/19/2017.  Patient is at risk for malnutrition.  NUTRITION - FOCUSED PHYSICAL EXAM:    Most Recent Value  Orbital Region  No depletion  Upper Arm Region  No depletion  Thoracic and Lumbar Region  No depletion  Buccal Region  No depletion  Temple Region  No depletion  Clavicle Bone Region  No depletion  Clavicle and Acromion Bone Region  No depletion  Scapular Bone Region  No depletion  Dorsal Hand  No depletion  Patellar Region  Unable to assess  Anterior Thigh Region  Unable to assess  Posterior Calf Region  Unable to assess  Edema (RD Assessment)  Mild  Hair  Reviewed [dull]  Eyes  Reviewed  Mouth  Reviewed  Skin  Reviewed [pallor]  Nails  Reviewed     Diet Order:  Diet 2 gram sodium Room service appropriate? Yes; Fluid consistency: Thin  EDUCATION NEEDS:   Education needs have been addressed  Skin:  Skin Assessment: Skin Integrity Issues:(non pressure injury to right groin; MSAD to abdominal folds)  Last BM:  11/15/2017 - small type 6  Height:   Ht Readings from Last 1 Encounters:  11/08/2017 _0  (1.676 m)    Weight:   Wt Readings from Last 1 Encounters:  11/20/2017 255 lb 11.7 oz (116 kg)    Ideal Body Weight:  59.1 kg  BMI:  Body mass index is 41.28 kg/m.  Estimated Nutritional Needs:    Kcal:  2030-2400 (MSJ x 1.1-1.3)  Protein:  75-90 grams (1.3-1.5 grams/kg IBW)  Fluid:  1.8-2 L/day (30-35 mL/kg IBW)  Willey Blade, MS, RD, LDN Office: [phone] Pager: [phone] After Hours/Weekend Pager: [phone]

## 2017-11-16 ENCOUNTER — Inpatient Hospital Stay
Admit: 2017-11-16 | Discharge: 2017-11-16 | Disposition: A | Discharge status: Home / Self Care | Payer: BLUE CROSS/BLUE SHIELD | Attending: Internal Medicine | Admitting: Internal Medicine

## 2017-11-16 DIAGNOSIS — R0600 Dyspnea, unspecified: Secondary | ICD-10-CM

## 2017-11-16 LAB — COMPREHENSIVE METABOLIC PANEL
ALBUMIN: 3.8 g/dL (ref 3.5–5.0)
ALT: 36 U/L (ref 14–54)
AST: 129 U/L — AB (ref 15–41)
Alkaline Phosphatase: 64 U/L (ref 38–126)
Anion gap: 9 (ref 5–15)
BILIRUBIN TOTAL: 4.8 mg/dL — AB (ref 0.3–1.2)
BUN: 37 mg/dL — AB (ref 6–20)
CO2: 19 mmol/L — ABNORMAL LOW (ref 22–32)
Calcium: 9.3 mg/dL (ref 8.9–10.3)
Chloride: 109 mmol/L (ref 101–111)
Creatinine, Ser: 2.41 mg/dL — ABNORMAL HIGH (ref 0.44–1.00)
GFR calc Af Amer: 28 mL/min — ABNORMAL LOW (ref >60)
GFR calc non Af Amer: 24 mL/min — ABNORMAL LOW (ref >60)
GLUCOSE: 123 mg/dL — AB (ref 65–99)
POTASSIUM: 3.4 mmol/L — AB (ref 3.5–5.1)
Sodium: 137 mmol/L (ref 135–145)
TOTAL PROTEIN: 7.1 g/dL (ref 6.5–8.1)

## 2017-11-16 LAB — PROTIME-INR
INR: 2.22
PROTHROMBIN TIME: 24.4 s — AB (ref 11.4–15.2)

## 2017-11-16 LAB — ECHOCARDIOGRAM COMPLETE
Height: 66 in
Weight: 4091.74 oz

## 2017-11-16 LAB — CMV DNA, QUANTITATIVE, PCR
CMV DNA QUANT: NEGATIVE [IU]/mL
Log10 CMV Qn DNA Pl: UNDETERMINED log10 IU/mL

## 2017-11-16 LAB — AMMONIA: Ammonia: 129 umol/L — ABNORMAL HIGH (ref 9–35)

## 2017-11-16 MED ORDER — PERFLUTREN LIPID MICROSPHERE
1.0000 mL | INTRAVENOUS | Status: AC | PRN
  Administered 2017-11-16: 2 mL via INTRAVENOUS
  Filled 2017-11-16: qty 10

## 2017-11-16 NOTE — Progress Notes (Signed)
Physical Therapy Treatment Patient Details Name: Brandy Cooper MRN: [id number] DOB: [birth year] Today's Date: 11/16/2017    History of Present Illness presented to ER secondary to vaginal bleeding, dizziness, lightheadedness and fall; admitted wiht acute renal failure, dysfunctional uterine bleeding (non-acute) and AMS (likely related to elevated ammonia, hepatic encephalopathy).    PT Comments    Pt agreeable to PT; denies pain or other issues. Pt demonstrates some mild instability with ambulation without assistive device. Discussed option of using assistive device (rolling walker) next visit; pt agreeable. Continue PT to progress balance and ambulation quality for improved function and safety.    Follow Up Recommendations  Home health PT     Equipment Recommendations       Recommendations for Other Services       Precautions / Restrictions Precautions Precautions: Fall Restrictions Weight Bearing Restrictions: No    Mobility  Bed Mobility Overal bed mobility: Modified Independent             General bed mobility comments: increased time/effort  Transfers Overall transfer level: Needs assistance Equipment used: None Transfers: Sit to/from Stand Sit to Stand: Supervision;Min guard         General transfer comment: strong use of BLE against bed for stability  Ambulation/Gait Ambulation/Gait assistance: Min guard Ambulation Distance (Feet): 200 Feet Assistive device: None(Pt does reach for wall several times and also IV pole) Gait Pattern/deviations: Decreased stride length;Wide base of support(partial step through; R/L sway/waddle)   Gait velocity interpretation: <1.8 ft/sec, indicate of risk for recurrent falls General Gait Details: Needs directional cues, often repeated several times. Attempts quick pace, but overall poor quality. Cues for slowed pace. Continues mildly unsteady.    Stairs             Wheelchair Mobility    Modified Rankin  (Stroke Patients Only)       Balance Overall balance assessment: Needs assistance Sitting-balance support: No upper extremity supported;Feet supported Sitting balance-Leahy Scale: Good     Standing balance support: No upper extremity supported;During functional activity Standing balance-Leahy Scale: Fair(Fair -)                              Cognition Arousal/Alertness: Awake/alert Behavior During Therapy: Flat affect Overall Cognitive Status: Difficult to assess                                 General Comments: Poor problem solving; delayed responses and occasional irrelevant responses      Exercises      General Comments        Pertinent Vitals/Pain Pain Assessment: No/denies pain    Home Living                      Prior Function            PT Goals (current goals can now be found in the care plan section) Progress towards PT goals: Progressing toward goals    Frequency    Min 2X/week      PT Plan Current plan remains appropriate    Co-evaluation              AM-PAC PT "6 Clicks" Daily Activity  Outcome Measure  Difficulty turning over in bed (including adjusting bedclothes, sheets and blankets)?: None Difficulty moving from lying on back to sitting on the side of the bed? :  A Little Difficulty sitting down on and standing up from a chair with arms (e.g., wheelchair, bedside commode, etc,.)?: A Little Help needed moving to and from a bed to chair (including a wheelchair)?: A Little Help needed walking in hospital room?: A Little Help needed climbing 3-5 steps with a railing? : A Lot 6 Click Score: 18    End of Session Equipment Utilized During Treatment: Gait belt Activity Tolerance: Patient tolerated treatment well Patient left: Other (comment);with nursing/sitter in room(seated edge of bed for late lunch)   PT Visit Diagnosis: Difficulty in walking, not elsewhere classified (R26.2);Muscle weakness  (generalized) (M62.81)     Time: 1610-9604 PT Time Calculation (min) (ACUTE ONLY): 22 min  Charges:  $Gait Training: 8-22 mins                    G Codes:        Scot Dock, PTA 11/16/2017, 5:26 PM

## 2017-11-16 NOTE — Progress Notes (Signed)
[facility] Date of Admission:  Dec 09, 2017     ID: Brandy Cooper is a 42 y.o. female with  Cirrhosis, + CMV IGM, HSV IGM  Principal Problem:   Subacute delirium Active Problems:   Alcohol abuse   ARF (acute renal failure) (HCC)   Subjective: Remains confused. No fevers.   ROS  Eleven systems are reviewed and negative except per hpi  Medications:  Antibiotics Given (last 72 hours)    Date/Time Action Medication Dose   11/13/17 2213 Given   rifaximin (XIFAXAN) tablet 550 mg 550 mg   11/14/17 1108 Given   rifaximin (XIFAXAN) tablet 550 mg 550 mg   11/14/17 2142 Given   rifaximin (XIFAXAN) tablet 550 mg 550 mg   11/15/17 1003 Given   rifaximin (XIFAXAN) tablet 550 mg 550 mg   11/15/17 2203 Given   rifaximin (XIFAXAN) tablet 550 mg 550 mg   11/16/17 1025 Given   rifaximin (XIFAXAN) tablet 550 mg 550 mg     . ALPRAZolam  2 mg Oral QHS  . buPROPion  300 mg Oral Daily  . feeding supplement (ENSURE ENLIVE)  237 mL Oral BID BM  . folic acid  1 mg Oral Daily  . lactulose  20 g Oral TID  . lidocaine  1 patch Transdermal Q24H  . multivitamin-lutein  1 capsule Oral Daily  . neomycin-bacitracin-polymyxin   Topical TID  . nystatin   Topical TID  . phytonadione  10 mg Oral Daily  . polyethylene glycol  17 g Oral BID  . predniSONE  40 mg Oral Daily  . rifaximin  550 mg Oral BID  . sodium chloride flush  3 mL Intravenous Q12H  . thiamine  100 mg Oral Daily    Objective: Vital signs in last 24 hours: Temp:  [98 F (36.7 C)-98.1 F (36.7 C)] 98.1 F (36.7 C) (04/22 1258) Pulse Rate:  [90-92] 90 (04/22 1258) Resp:  [18] 18 (04/22 1258) BP: (98-107)/(63-66) 107/66 (04/22 1258) SpO2:  [100 %] 100 % (04/22 1258) Constitutional:  Obese, very slow mentation HENT: [place]/AT, PERRLA, + scleral icterus Mouth/Throat: Oropharynx is clear and dry . No oropharyngeal exudate.  Cardiovascular: Normal rate, regular rhythm and normal heart  sound Pulmonary/Chest: Effort normal and breath sounds normal. No respiratory distress.  has no wheezes.  Neck = supple, no nuchal rigidity Abdominal: Soft. Obese  Bowel sounds are normal.  exhibits no distension. There is no tenderness.  Lymphadenopathy: no cervical adenopathy. No axillary adenopathy Neurological:very slow mentation, + asterexis Skin: bil dorsum of hands with dry scaly skin, some bruising  Psychiatric: lethargic     Lab Results Recent Labs    11/14/17 0634 11/15/17 0507 11/16/17 0517  WBC 5.5 3.5*  --   HGB 9.3* 8.4*  --   HCT 26.0* 24.7*  --   NA  --  139 137  K  --  3.7 3.4*  CL  --  113* 109  CO2  --  20* 19*  BUN  --  35* 37*  CREATININE  --  2.59* 2.41*    Microbiology: Results for orders placed or performed during the hospital encounter of 08-13-17  Wet prep, genital     Status: Abnormal   Collection Time: 08-13-17  8:31 AM  Result Value Ref Range Status   Yeast Wet Prep HPF POC NONE SEEN NONE SEEN Final   Trich, Wet Prep NONE SEEN NONE SEEN Final   Clue Cells Wet Prep HPF POC PRESENT (A)  NONE SEEN Final   WBC, Wet Prep HPF POC MODERATE (A) NONE SEEN Final   Sperm NONE SEEN  Final    Comment: Performed at [facility], [address] [place], [place] [address]  Chlamydia/NGC rt PCR [facility] only)     Status: None   Collection Time: 11/16/2017  8:31 AM  Result Value Ref Range Status   Specimen source GC/Chlam CHLAMYDIA SPECIES  Final   Chlamydia Tr NOT DETECTED NOT DETECTED Final   N gonorrhoeae NOT DETECTED NOT DETECTED Final    Comment: (NOTE) 100  This methodology has not been evaluated in pregnant women or in 200  patients with a history of hysterectomy. 300 400  This methodology will not be performed on patients less than 44  years of age. Performed at [facility], [address] [place], [place] [address]     Studies/Results: US Renal  Result Date: 11/15/2017 CLINICAL DATA:  Acute kidney injury. EXAM: RENAL /  URINARY TRACT ULTRASOUND COMPLETE COMPARISON:  11/09/2017. FINDINGS: Right Kidney: Length: 10.4 cm. Echogenicity within normal limits. No mass or hydronephrosis visualized. Left Kidney: Length: 10.4 cm. Again demonstrated is diffuse cortical thinning with prominent renal sinus fat. Normal parenchymal echotexture. No hydronephrosis. The previously demonstrated exophytic lower pole cyst was not imaged today. Bladder: Not visualized. The examination was limited by body habitus and lack of bladder distention. IMPRESSION: 1. No acute abnormality. 2. Stable mild diffuse left renal cortical atrophy. Electronically Signed   By: Beckie Salts M.D.   On: 11/15/2017 10:58    Assessment/Plan: Brandy Cooper is a 42 y.o. female with heavy ETOH hx and new dx of cirrhosis.  Asked to see for + CMV IgM.  This is chronic liver disease given the severe hepatic steatosis on MRI, elevated INR and ammonia and TCP.  ALT and AST are only mildly elevated. She has no recent mono like illness. I think the + Igm for HSV and CMV are not related at all.   CMV PCR neg Recommendations No antiviral therapy indicated. Continue to treat alcoholic hepatitis with steroids as you are.  Thank you very much for the consult. Will follow with you.  Mick Sell   11/16/2017, 2:04 PM

## 2017-11-16 NOTE — Progress Notes (Signed)
[facility] - [place] at [facility]   PATIENT NAME: Brandy Cooper    MR#:  [id number]  DATE OF BIRTH:  11/17/1975  SUBJECTIVE:  CHIEF COMPLAINT:   Chief Complaint  Patient presents with  . Vaginal Bleeding  Patient is more awake and alert and no new complaints, falling asleep while talking, she didn't sleep well last night REVIEW OF SYSTEMS:  Limited review of system  cONSTITUTIONAL: No fever, fatigue  EYES: No blurred or double vision.  EARS, NOSE, AND THROAT: No tinnitus or ear pain.  RESPIRATORY: No cough, shortness of breath, wheezing or hemoptysis.  CARDIOVASCULAR: No chest pain, orthopnea, edema.  GASTROINTESTINAL: No nausea, vomiting, diarrhea or abdominal pain.  GENITOURINARY: No dysuria, hematuria.  SKIN: No rash or lesion.    DRUG ALLERGIES:   Allergies  Allergen Reactions  . Celebrex [Celecoxib] Hives  . Morphine And Related Itching    VITALS:  Blood pressure 107/66, pulse 90, temperature 98.1 F (36.7 C), temperature source Oral, resp. rate 18, height 5\' 6"  (1.676 m), weight 116 kg (255 lb 11.7 oz), SpO2 100 %.  PHYSICAL EXAMINATION:  Physical Exam  GENERAL:  42 y.o.-year-old patient lying in the bed with no acute distress.  EYES: Pupils equal, round, reactive to light and accommodation.  Has scleral icterus. Extraocular muscles intact. Icteric sclerae HEENT: Head atraumatic, normocephalic. Oropharynx and nasopharynx clear.  NECK:  Supple, no jugular venous distention. No thyroid enlargement, no tenderness.  LUNGS: Normal breath sounds bilaterally, no wheezing, rales,rhonchi or crepitation. No use of accessory muscles of respiration. Decreased bibasilar breath sounds CARDIOVASCULAR: S1, S2 normal. No murmurs, rubs, or gallops.  ABDOMEN: Soft, obese, nontender, nondistended. Bowel sounds present. Intertrigo  EXTREMITIES: No pedal edema, cyanosis, or clubbing.  NEUROLOGIC: Cranial nerves II through XII are intact. Sensation intact. Gait  not checked.  Global weakness noted PSYCHIATRIC: The patient is alert and oriented x 3 SKIN: No obvious rash, lesion, or ulcer.    LABORATORY PANEL:   CBC Recent Labs  Lab 11/15/17 0507  WBC 3.5*  HGB 8.4*  HCT 24.7*  PLT 78*   ------------------------------------------------------------------------------------------------------------------  Chemistries  Recent Labs  Lab 11/16/17 0517  NA 137  K 3.4*  CL 109  CO2 19*  GLUCOSE 123*  BUN 37*  CREATININE 2.41*  CALCIUM 9.3  AST 129*  ALT 36  ALKPHOS 64  BILITOT 4.8*   ------------------------------------------------------------------------------------------------------------------  Cardiac Enzymes No results for input(s): TROPONINI in the last 168 hours. ------------------------------------------------------------------------------------------------------------------  RADIOLOGY:  [place] Renal  Result Date: 11/15/2017 CLINICAL DATA:  Acute kidney injury. EXAM: RENAL / URINARY TRACT ULTRASOUND COMPLETE COMPARISON:  11/09/2017. FINDINGS: Right Kidney: Length: 10.4 cm. Echogenicity within normal limits. No mass or hydronephrosis visualized. Left Kidney: Length: 10.4 cm. Again demonstrated is diffuse cortical thinning with prominent renal sinus fat. Normal parenchymal echotexture. No hydronephrosis. The previously demonstrated exophytic lower pole cyst was not imaged today. Bladder: Not visualized. The examination was limited by body habitus and lack of bladder distention. IMPRESSION: 1. No acute abnormality. 2. Stable mild diffuse left renal cortical atrophy. Electronically Signed   By: Beckie SaltsSteven  Reid M.D.   On: 11/15/2017 10:58    EKG:   Orders placed or performed during the hospital encounter of 11/03/2017  . ED EKG  . ED EKG  . EKG 12-Lead  . EKG 12-Lead  . EKG    ASSESSMENT AND PLAN:   Brandy Bossngela Dastrup  is a 42 y.o. female with a known history of anxiety and depression, status post  laparoscopic gastric sleeve resection,  dysfunctional uterine bleeding presents to hospital secondary to a fall.  #. 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 -Patient was counseled to stop drinking alcohol and she will be benefited with outpatient alcohol Anonymous -continue lactulose and xifaxan -patient is alert and tolerating medications by mouth this afternoon as reported by the RN - ammonia still elevated at 129  (was [phone] ), -?  Is this her new baseline ammonia -GI following -IV albumin, vit K - EGD as outpt - BP is low to add any lasix or aldactone -Outpatient follow-up with gastroenterology as recommended for possible biopsy to know the staging of alcoholic liver cirrhosis  2.  Acute renal failure-  probably hepatorenal syndrome -Renal ultrasound-normal -Renal function is getting worse creatinine 1.3-2.59-2.41  Monitor intake and output- Nephrology following - could be hepatorenal, with new diagnosis of cirrhosis -continue  aldactone once BP improves -Hold metformin and other nephrotoxins  3.  Alcoholic hepatitis and IgM CMV positive CMV PCR sent ID recommended steroids for alcoholic hepatitis.  Patient is started on prednisolone 40 mg once daily will continue for 28 days and taper and a course of 2 weeks Calculate [name]'s score on day 7 to decide if she is responding to steroids per GI   4.  Depression/anxiety/subs abuse-appreciate psych input - continue the Wellbutrin & Xanax.     5. Bacterial vaginosis/intertrigo- flagyl 500mg  bid for 7 days course completed Clinically improving Nystatin powder  Out of bed as tolerated  6.  Dysfunctional uterine bleeding-going on for a few months, had a left ovarian cyst based on pelvic ultrasound 2 months ago. -Missed recent follow-up visits and increased bleeding with worsening anemia. -Appreciate OB/GYN consult-they have recommended outpatient follow-up since no active bleeding at this  time  7.  DVT prophylaxis-teds and SCDs.  Hold off on heparin products due to bleeding and thrombocytopenia    Physical therapy is recommending home health PT   All the records are reviewed and case discussed with Care Management/Social Workerr. Management plans discussed with the patient, mom at bedside.  Verbalized understanding of the plan CODE STATUS: Full Code  TOTAL TIME TAKING CARE OF THIS PATIENT: 33 minutes.   POSSIBLE D/C IN 1-2 DAYS, DEPENDING ON CLINICAL CONDITION.   Ramonita Lab M.D on 11/16/2017 at 2:26 PM  Between 7am to 6pm - Pager - [phone]  After 6pm go to [email or web address] - password [email or web address]  [facility]  Office  [phone]  CC: Primary care physician; Dortha Kern, MD

## 2017-11-16 NOTE — Progress Notes (Signed)
[facility]  ROUNDING NOTE   Subjective:   Sitter at bedside.   Flat affect  Objective:  Vital signs in last 24 hours:  Temp:  [98 F (36.7 C)-98.1 F (36.7 C)] 98.1 F (36.7 C) (04/22 1258) Pulse Rate:  [90-92] 90 (04/22 1258) Resp:  [18] 18 (04/22 1258) BP: (98-107)/(63-66) 107/66 (04/22 1258) SpO2:  [100 %] 100 % (04/22 1258)  Weight change:  Filed Weights   11/07/2017 0751 10/31/2017 1426  Weight: 113.4 kg (250 lb) 116 kg (255 lb 11.7 oz)    Intake/Output: I/O last 3 completed shifts: In: 2711 [P.O.:1600; I.V.:952; IV Piggyback:159] Out: 50 [Urine:50]   Intake/Output this shift:  Total I/O In: 120 [P.O.:120] Out: 250 [Urine:250]  Physical Exam: General: NAD,   Head: Normocephalic, atraumatic. Moist oral mucosal membranes  Eyes: Anicteric, PERRL  Neck: Supple, trachea midline  Lungs:  Clear to auscultation  Heart: Regular rate and rhythm  Abdomen:  Soft, nontender,   Extremities: no peripheral edema.  Neurologic: Nonfocal, moving all four extremities  Skin: No lesions  Psych Flat affect    Basic Metabolic Panel: Recent Labs  Lab 11/10/17 0506 11/11/17 0507 11/12/17 0459 11/15/17 0507 11/16/17 0517  NA 132* 134* 136 139 137  K 4.3 4.1 4.0 3.7 3.4*  CL 104 106 110 113* 109  CO2 21* 22 20* 20* 19*  GLUCOSE 116* 113* 103* 139* 123*  BUN 20 21* 22* 35* 37*  CREATININE 1.52* 1.22* 1.30* 2.59* 2.41*  CALCIUM 8.8* 8.6* 8.5* 8.8* 9.3    Liver Function Tests: Recent Labs  Lab 11/10/17 0506 11/11/17 0507 11/12/17 0459 11/14/17 0634 11/15/17 0507 11/16/17 0517  AST 84*  --  70* 82* 109* 129*  ALT 23 BLOOD 21 25 29  36  ALKPHOS 72  --  60 65 63 64  BILITOT 7.9*  --  8.6* 6.4* 5.0* 4.8*  PROT 6.2*  --  5.8* 6.4* 6.0* 7.1  ALBUMIN 2.6*  --  2.3* 2.8* 2.6* 3.8   No results for input(s): LIPASE, AMYLASE in the last 168 hours. Recent Labs  Lab 11/14/17 1022 11/15/17 0858 11/16/17 0913  AMMONIA 130* 117* 129*    CBC: Recent Labs   Lab 11/11/17 0507 11/12/17 0459 11/13/17 0530 11/14/17 0634 11/15/17 0507  WBC 3.2* 3.7 2.8* 5.5 3.5*  HGB 9.6* 9.4* 9.2* 9.3* 8.4*  HCT 27.9* 27.2* 26.4* 26.0* 24.7*  MCV 107.0* 107.9* 108.1* 108.6* 108.8*  PLT 74* 72* 74* 86* 78*    Cardiac Enzymes: No results for input(s): CKTOTAL, CKMB, CKMBINDEX, TROPONINI in the last 168 hours.  BNP: Invalid input(s): POCBNP  CBG: No results for input(s): GLUCAP in the last 168 hours.  Microbiology: Results for orders placed or performed during the hospital encounter of 11/05/2017  Wet prep, genital     Status: Abnormal   Collection Time: 11/08/2017  8:31 AM  Result Value Ref Range Status   Yeast Wet Prep HPF POC NONE SEEN NONE SEEN Final   Trich, Wet Prep NONE SEEN NONE SEEN Final   Clue Cells Wet Prep HPF POC PRESENT (A) NONE SEEN Final   WBC, Wet Prep HPF POC MODERATE (A) NONE SEEN Final   Sperm NONE SEEN  Final    Comment: Performed at [facility], [address] [place], [place] [address]  Chlamydia/NGC rt PCR [facility] only)     Status: None   Collection Time: 11/21/2017  8:31 AM  Result Value Ref Range Status   Specimen source GC/Chlam CHLAMYDIA SPECIES  Final   Chlamydia Tr NOT DETECTED NOT DETECTED Final   N gonorrhoeae NOT DETECTED NOT DETECTED Final    Comment: (NOTE) 100  This methodology has not been evaluated in pregnant women or in 200  patients with a history of hysterectomy. 300 400  This methodology will not be performed on patients less than 4614  years of age. Performed at [facility], [address] [place], [place] [address]     Coagulation Studies: Recent Labs    11/14/17 [id number] 11/15/17 0507 11/16/17 0517  LABPROT 32.8* 29.1* 24.4*  INR 3.24 2.78 2.22    Urinalysis: No results for input(s): COLORURINE, LABSPEC, PHURINE, GLUCOSEU, HGBUR, BILIRUBINUR, KETONESUR, PROTEINUR, UROBILINOGEN, NITRITE, LEUKOCYTESUR in the last 72 hours.  Invalid input(s): APPERANCEUR    Imaging: [place]  Renal  Result Date: 11/15/2017 CLINICAL DATA:  Acute kidney injury. EXAM: RENAL / URINARY TRACT ULTRASOUND COMPLETE COMPARISON:  11/09/2017. FINDINGS: Right Kidney: Length: 10.4 cm. Echogenicity within normal limits. No mass or hydronephrosis visualized. Left Kidney: Length: 10.4 cm. Again demonstrated is diffuse cortical thinning with prominent renal sinus fat. Normal parenchymal echotexture. No hydronephrosis. The previously demonstrated exophytic lower pole cyst was not imaged today. Bladder: Not visualized. The examination was limited by body habitus and lack of bladder distention. IMPRESSION: 1. No acute abnormality. 2. Stable mild diffuse left renal cortical atrophy. Electronically Signed   By: Beckie SaltsSteven  Reid M.D.   On: 11/15/2017 10:58     Medications:   . sodium chloride    . sodium chloride 50 mL/hr at 11/15/17 1223  . albumin human     . ALPRAZolam  2 mg Oral QHS  . buPROPion  300 mg Oral Daily  . feeding supplement (ENSURE ENLIVE)  237 mL Oral BID BM  . folic acid  1 mg Oral Daily  . lactulose  20 g Oral TID  . lidocaine  1 patch Transdermal Q24H  . multivitamin-lutein  1 capsule Oral Daily  . neomycin-bacitracin-polymyxin   Topical TID  . nystatin   Topical TID  . phytonadione  10 mg Oral Daily  . polyethylene glycol  17 g Oral BID  . predniSONE  40 mg Oral Daily  . rifaximin  550 mg Oral BID  . sodium chloride flush  3 mL Intravenous Q12H  . thiamine  100 mg Oral Daily   sodium chloride, alum & mag hydroxide-simeth, diazepam, ondansetron **OR** ondansetron (ZOFRAN) IV, oxyCODONE-acetaminophen, sodium chloride flush  Assessment/ Plan:  Ms. Manon Hildingngela M Sia is a 42 y.o. white female anxiety, bipolar disorder, depression, alcohol abuse, who was admitted to [facility] on 11/11/2017 for evaluation of vaginal bleeding as well as recent fall.   1.  Acute renal failure.  2.  Hepatic steatosis/Alcoholic hepatitis.  3.  Hepatic encephalopathy.   Plan:  - IV albumin - IV fluids -  Holding diuretics.    LOS: 10 June [place] 4/22/20193:57 PM

## 2017-11-16 NOTE — Progress Notes (Signed)
*  PRELIMINARY RESULTS* Echocardiogram 2D Echocardiogram has been performed.  Joanette GulaJoan M Jimia Gentles 11/16/2017, 10:13 AM

## 2017-11-17 DIAGNOSIS — Z515 Encounter for palliative care: Secondary | ICD-10-CM

## 2017-11-17 DIAGNOSIS — F1010 Alcohol abuse, uncomplicated: Secondary | ICD-10-CM

## 2017-11-17 DIAGNOSIS — K7030 Alcoholic cirrhosis of liver without ascites: Secondary | ICD-10-CM

## 2017-11-17 DIAGNOSIS — Z7189 Other specified counseling: Secondary | ICD-10-CM

## 2017-11-17 DIAGNOSIS — F19950 Other psychoactive substance use, unspecified with psychoactive substance-induced psychotic disorder with delusions: Secondary | ICD-10-CM

## 2017-11-17 LAB — CBC WITH DIFFERENTIAL/PLATELET
BASOS ABS: 0 10*3/uL (ref 0–0.1)
Basophils Relative: 1 %
EOS ABS: 0 10*3/uL (ref 0–0.7)
EOS PCT: 0 %
HCT: 23.1 % — ABNORMAL LOW (ref 35.0–47.0)
Hemoglobin: 7.8 g/dL — ABNORMAL LOW (ref 12.0–16.0)
LYMPHS ABS: 0.4 10*3/uL — AB (ref 1.0–3.6)
Lymphocytes Relative: 14 %
MCH: 36.9 pg — AB (ref 26.0–34.0)
MCHC: 33.9 g/dL (ref 32.0–36.0)
MCV: 109.1 fL — AB (ref 80.0–100.0)
MONO ABS: 0.2 10*3/uL (ref 0.2–0.9)
Monocytes Relative: 6 %
Neutro Abs: 2.3 10*3/uL (ref 1.4–6.5)
Neutrophils Relative %: 79 %
PLATELETS: 65 10*3/uL — AB (ref 150–440)
RBC: 2.12 MIL/uL — ABNORMAL LOW (ref 3.80–5.20)
RDW: 23.6 % — AB (ref 11.5–14.5)
WBC: 2.9 10*3/uL — ABNORMAL LOW (ref 3.6–11.0)

## 2017-11-17 LAB — HSV(HERPES SIMPLEX VRS) I + II AB-IGG
HSV 1 Glycoprotein G Ab, IgG: <0.91 index (ref 0.00–0.90)
HSV 2 Glycoprotein G Ab, IgG: 3.14 index — ABNORMAL HIGH (ref 0.00–0.90)

## 2017-11-17 LAB — COMPREHENSIVE METABOLIC PANEL
ALK PHOS: 63 U/L (ref 38–126)
ALT: 33 U/L (ref 14–54)
ANION GAP: 7 (ref 5–15)
AST: 104 U/L — ABNORMAL HIGH (ref 15–41)
Albumin: 3.9 g/dL (ref 3.5–5.0)
BILIRUBIN TOTAL: 4.1 mg/dL — AB (ref 0.3–1.2)
BUN: 39 mg/dL — ABNORMAL HIGH (ref 6–20)
CALCIUM: 9.1 mg/dL (ref 8.9–10.3)
CO2: 19 mmol/L — ABNORMAL LOW (ref 22–32)
CREATININE: 2.01 mg/dL — AB (ref 0.44–1.00)
Chloride: 110 mmol/L (ref 101–111)
GFR calc non Af Amer: 30 mL/min — ABNORMAL LOW (ref >60)
GFR, EST AFRICAN AMERICAN: 34 mL/min — AB (ref >60)
Glucose, Bld: 143 mg/dL — ABNORMAL HIGH (ref 65–99)
Potassium: 3.5 mmol/L (ref 3.5–5.1)
Sodium: 136 mmol/L (ref 135–145)
TOTAL PROTEIN: 6.7 g/dL (ref 6.5–8.1)

## 2017-11-17 LAB — PROTIME-INR
INR: 2.26
Prothrombin Time: 24.8 seconds — ABNORMAL HIGH (ref 11.4–15.2)

## 2017-11-17 LAB — AMMONIA: Ammonia: 176 umol/L — ABNORMAL HIGH (ref 9–35)

## 2017-11-17 LAB — HSV-2 IGG SUPPLEMENTAL TEST: HSV-2 IGG SUPPLEMENTAL TEST: POSITIVE — AB

## 2017-11-17 MED ORDER — I-VITE PROTECT PO TABS
1.0000 | ORAL_TABLET | Freq: Every day | ORAL | Status: DC
  Administered 2017-11-17 – 2017-11-19 (×3): 1 via ORAL
  Filled 2017-11-17 (×4): qty 1

## 2017-11-17 MED ORDER — OLANZAPINE 10 MG IM SOLR
10.0000 mg | Freq: Once | INTRAMUSCULAR | Status: DC | PRN
  Filled 2017-11-17: qty 10

## 2017-11-17 MED ORDER — LACTULOSE 10 GM/15ML PO SOLN
20.0000 g | Freq: Two times a day (BID) | ORAL | Status: DC
  Administered 2017-11-17 – 2017-11-21 (×9): 20 g via ORAL
  Filled 2017-11-17 (×10): qty 30

## 2017-11-17 MED ORDER — OLANZAPINE 10 MG PO TBDP
10.0000 mg | ORAL_TABLET | Freq: Every day | ORAL | Status: DC
  Administered 2017-11-17 – 2017-11-21 (×5): 10 mg via ORAL
  Filled 2017-11-17 (×5): qty 1

## 2017-11-17 NOTE — Clinical Social Work Note (Signed)
CSW received phone call from patient's mother, per palliative note, patient said it is unhealthy for her to be around her mother and she would like her aunt Brandy Cooper to make decisions if she can't or her father to be second in making decisions.  CSW informed bedside nurse that it does not sound like patient wants to have contact with her mother per palliative note.  CSW was talking with patient's mother who is going to call patient's nurse to get updates on patient's progress.  CSW informed bedside nurse that she should check with patient to make sure she does not want any information to be given to patient's mother.  Brandy KnackEric R. Dona Walby, MSW, Theresia MajorsLCSWA [phone]  11/17/2017 5:12 PM

## 2017-11-17 NOTE — Care Management (Signed)
Unable to complete assessment with patient at this time.  She is having paranoid thoughts and hallucinating.  Patient believes that the staff has stolen $3000 from her account.  She believes that she stayed at a friends house last night, instead of the hallway.  She sees "another room through the wall, that looks just like mine.  They keep moving me from room to room trying to stall and confuse me"  MD has order psych consult to determine capacity. .Marland Kitchen

## 2017-11-17 NOTE — Progress Notes (Signed)
Please page chaplain to assist pt. HCPOA document  when patient is totally awake and alert.

## 2017-11-17 NOTE — Progress Notes (Signed)
PT Cancellation Note  Patient Details Name: Brandy Cooper MRN: [id number] DOB: 20-Oct-1975   Cancelled Treatment:    Reason Eval/Treat Not Completed: Medical issues which prohibited therapy. PT held today as BP running quite low making stand/ambulation activities unsafe. Re attempt at a later date as vital/medical stats allow.    Scot DockHeidi E Shavontae Gibeault, PTA 11/17/2017, 4:50 PM

## 2017-11-17 NOTE — Progress Notes (Signed)
Patient is confused this afternoon. Patient stated that her credit card was taken by me Corey Skains(Nadalee Neiswender) per Dr. Sampson GoonFitzgerald and put a lot of charges on it. When I talked to her, she said to me again that her credit card was taken out and she wants to call security to report it because supposedly there was a lot of charges placed on the card. She told the kitchen lady her concern about her credit card again. She seems to be hallucinating and hearing something unreal.  She grabbed the blanket and used it as a telephone. We will continue to monitor.

## 2017-11-17 NOTE — Progress Notes (Signed)
Pt. is sitting in the chair at this time. Nurse tech was instructed to put a chair alarm for safety precaution and nurse tech agreed. We will continue to monitor.

## 2017-11-17 NOTE — Progress Notes (Signed)
[facility] [place] [facility]   PATIENT NAME: Brandy Cooper    MR#:  [id number]  DATE OF BIRTH:  1976/04/18  SUBJECTIVE:  CHIEF COMPLAINT:   Chief Complaint  Patient presents with  . Vaginal Bleeding  Patient is ok,aunt at bed side REVIEW OF SYSTEMS:  Limited review of system  cONSTITUTIONAL: No fever, fatigue  EYES: No blurred or double vision.  EARS, NOSE, AND THROAT: No tinnitus or ear pain.  RESPIRATORY: No cough, shortness of breath, wheezing or hemoptysis.  CARDIOVASCULAR: No chest pain, orthopnea, edema.  GASTROINTESTINAL: No nausea, vomiting, diarrhea or abdominal pain.  GENITOURINARY: No dysuria, hematuria.  SKIN: No rash or lesion.    DRUG ALLERGIES:   Allergies  Allergen Reactions  . Celebrex [Celecoxib] Hives  . Morphine And Related Itching    VITALS:  Blood pressure (!) 97/56, pulse 83, temperature (!) 97.3 F (36.3 C), temperature source Oral, resp. rate 18, height 5\' 6"  (1.676 m), weight 116 kg (255 lb 11.7 oz), SpO2 95 %.  PHYSICAL EXAMINATION:  Physical Exam  GENERAL:  42 y.o.-year-old patient lying in the bed with no acute distress.  EYES: Pupils equal, round, reactive to light and accommodation.  Has scleral icterus. Extraocular muscles intact. Icteric sclerae HEENT: Head atraumatic, normocephalic. Oropharynx and nasopharynx clear.  NECK:  Supple, no jugular venous distention. No thyroid enlargement, no tenderness.  LUNGS: Normal breath sounds bilaterally, no wheezing, rales,rhonchi or crepitation. No use of accessory muscles of respiration. Decreased bibasilar breath sounds CARDIOVASCULAR: S1, S2 normal. No murmurs, rubs, or gallops.  ABDOMEN: Soft, obese, nontender, nondistended. Bowel sounds present. Intertrigo  EXTREMITIES: No pedal edema, cyanosis, or clubbing.  NEUROLOGIC: Cranial nerves II through XII are intact. Sensation intact. Gait not checked.  Global weakness noted PSYCHIATRIC: The patient is alert and  oriented x 3 SKIN: No obvious rash, lesion, or ulcer.    LABORATORY PANEL:   CBC Recent Labs  Lab 11/17/17 0551  WBC 2.9*  HGB 7.8*  HCT 23.1*  PLT 65*   ------------------------------------------------------------------------------------------------------------------  Chemistries  Recent Labs  Lab 11/17/17 0557  NA 136  K 3.5  CL 110  CO2 19*  GLUCOSE 143*  BUN 39*  CREATININE 2.01*  CALCIUM 9.1  AST 104*  ALT 33  ALKPHOS 63  BILITOT 4.1*   ------------------------------------------------------------------------------------------------------------------  Cardiac Enzymes No results for input(s): TROPONINI in the last 168 hours. ------------------------------------------------------------------------------------------------------------------  RADIOLOGY:  No results found.  EKG:   Orders placed or performed during the hospital encounter of 05/25/18  . ED EKG  . ED EKG  . EKG 12-Lead  . EKG 12-Lead  . EKG    ASSESSMENT AND PLAN:   Brandy Cooper  is a 42 y.o. female with a known history of anxiety and depression, status post laparoscopic gastric sleeve resection, dysfunctional uterine bleeding presents to hospital secondary to a fall.  #. 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 -Patient was counseled to stop drinking alcohol and she will be benefited with outpatient alcohol Anonymous -continue lactulose and xifaxan -patient is alert and tolerating medications by mouth this afternoon as reported by the RN - ammonia still elevated at 176  (was 139-109-102-117 ), -?  Is this her new baseline ammonia -GI following -IV albumin, vit K - EGD as outpt - BP is low to add any lasix or aldactone -Outpatient follow-up with gastroenterology as recommended for possible biopsy to know the staging of alcoholic liver cirrhosis  2.  Acute renal failure-  probably hepatorenal syndrome -Renal  ultrasound-normal -Renal function is getting worse creatinine 1.3-2.59-2.41  Monitor intake and output- Nephrology following - could be hepatorenal, with new diagnosis of cirrhosis -continue  aldactone once BP improves -Hold metformin and other nephrotoxins  3.  Alcoholic hepatitis and IgM CMV positive CMV PCR sent ID recommended steroids for alcoholic hepatitis.  Patient is started on prednisolone 40 mg once daily will continue for 28 days and taper and a course of 2 weeks Calculate [name]'s score on day 7 to decide if she is responding to steroids per GI   4.  Depression/anxiety/subs abuse-appreciate psych input - continue the Wellbutrin & Xanax.     5. Bacterial vaginosis/intertrigo- flagyl 500mg  bid for 7 days course completed Clinically improving Nystatin powder  Out of bed as tolerated  6.  Dysfunctional uterine bleeding-going on for a few months, had a left ovarian cyst based on pelvic ultrasound 2 months ago. -Missed recent follow-up visits and increased bleeding with worsening anemia. -Appreciate OB/GYN consult-they have recommended outpatient follow-up since no active bleeding at this time  7.  Hematuria after mechanical trauma and thrombocytopenia clearing up with bladder irrigation  Check CBC in a.m.  8.  Failure to thrive-seen by palliative care Family meeting on Thursday at 4 PM with the dad, aunt Patient's auntSyvil - [phone], had a lengthy discussion with her.  She would like to talk to GI, notified GI   DVT prophylaxis-teds and SCDs.  Hold off on heparin products due to bleeding and thrombocytopenia    Physical therapy is recommending home health PT   All the records are reviewed and case discussed with Care Management/Social Workerr. Management plans discussed with the patient, mom at bedside.  Verbalized understanding of the plan CODE STATUS: Full Code  TOTAL TIME TAKING CARE OF THIS PATIENT: 33 minutes.   POSSIBLE D/C IN 1-2 DAYS, DEPENDING ON  CLINICAL CONDITION.   Ramonita Lab M.D on 42/23/2019 at 4:24 PM  Between 7am to 6pm - Pager - [phone]  After 6pm go to [email or web address] - password [email or web address]  [facility]  Office  [phone]  CC: Primary care physician; Dortha Kern, MD

## 2017-11-17 NOTE — Progress Notes (Signed)
Jonathon Bellows , MD [address], [place], [place], [place], [address] [address], [place], [place], [place], [address] Phone: [phone]  Fax: [phone]   TANEA MOGA is being followed for alcoholic hepatitis    Subjective:  Feels well , feels alert   Objective: Vital signs in last 24 hours: Vitals:   11/16/17 1258 11/16/17 1553 11/16/17 2018 11/17/17 0617  BP: 107/66  101/61 102/76  Pulse: 90 100 86 92  Resp: 18  (!) 24 (!) 24  Temp: 98.1 F (36.7 C)  98.3 F (36.8 C) 98.1 F (36.7 C)  TempSrc: Oral  Oral Oral  SpO2: 100% 98% 99% 99%  Weight:      Height:       Weight change:   Intake/Output Summary (Last 24 hours) at 11/17/2017 7262 Last data filed at 11/17/2017 0355 Gross per 24 hour  Intake 2459 ml  Output 1025 ml  Net 1434 ml     Exam: Heart:: Regular rate and rhythm, S1S2 present or without murmur or extra heart sounds Lungs: normal, clear to auscultation and clear to auscultation and percussion Abdomen: soft, nontender, normal bowel sounds   Lab Results: '@LABTEST2' @ Micro Results: No results found for this or any previous visit (from the past 240 hour(s)). Studies/Results: US Renal  Result Date: 11/15/2017 CLINICAL DATA:  Acute kidney injury. EXAM: RENAL / URINARY TRACT ULTRASOUND COMPLETE COMPARISON:  11/09/2017. FINDINGS: Right Kidney: Length: 10.4 cm. Echogenicity within normal limits. No mass or hydronephrosis visualized. Left Kidney: Length: 10.4 cm. Again demonstrated is diffuse cortical thinning with prominent renal sinus fat. Normal parenchymal echotexture. No hydronephrosis. The previously demonstrated exophytic lower pole cyst was not imaged today. Bladder: Not visualized. The examination was limited by body habitus and lack of bladder distention. IMPRESSION: 1. No acute abnormality. 2. Stable mild diffuse left renal cortical atrophy. Electronically Signed   By: Claudie Revering M.D.   On: 11/15/2017 10:58   Medications: I have reviewed  the patient's current medications. Scheduled Meds: . ALPRAZolam  2 mg Oral QHS  . buPROPion  300 mg Oral Daily  . feeding supplement (ENSURE ENLIVE)  237 mL Oral BID BM  . folic acid  1 mg Oral Daily  . I-VITE PROTECT  1 tablet Oral Daily  . lactulose  20 g Oral TID  . lidocaine  1 patch Transdermal Q24H  . neomycin-bacitracin-polymyxin   Topical TID  . nystatin   Topical TID  . phytonadione  10 mg Oral Daily  . polyethylene glycol  17 g Oral BID  . predniSONE  40 mg Oral Daily  . rifaximin  550 mg Oral BID  . sodium chloride flush  3 mL Intravenous Q12H  . thiamine  100 mg Oral Daily   Continuous Infusions: . sodium chloride    . sodium chloride 50 mL/hr at 11/17/17 0539  . albumin human     PRN Meds:.sodium chloride, alum & mag hydroxide-simeth, diazepam, ondansetron **OR** ondansetron (ZOFRAN) IV, oxyCODONE-acetaminophen, sodium chloride flush Hepatic Function Latest Ref Rng & Units 11/17/2017 11/16/2017 11/15/2017  Total Protein 6.5 - 8.1 g/dL 6.7 7.1 6.0(L)  Albumin 3.5 - 5.0 g/dL 3.9 3.8 2.6(L)  AST 15 - 41 U/L 104(H) 129(H) 109(H)  ALT 14 - 54 U/L 33 36 29  Alk Phosphatase 38 - 126 U/L 63 64 63  Total Bilirubin 0.3 - 1.2 mg/dL 4.1(H) 4.8(H) 5.0(H)  Bilirubin, Direct 0.1 - 0.5 mg/dL - - 2.5(H)   CBC Latest Ref Rng & Units 11/15/2017  11/14/2017 11/13/2017  WBC 3.6 - 11.0 K/uL 3.5(L) 5.5 2.8(L)  Hemoglobin 12.0 - 16.0 g/dL 8.4(L) 9.3(L) 9.2(L)  Hematocrit 35.0 - 47.0 % 24.7(L) 26.0(L) 26.4(L)  Platelets 150 - 440 K/uL 78(L) 86(L) 74(L)   BMP Latest Ref Rng & Units 11/17/2017 11/16/2017 11/15/2017  Glucose 65 - 99 mg/dL 143(H) 123(H) 139(H)  BUN 6 - 20 mg/dL 39(H) 37(H) 35(H)  Creatinine 0.44 - 1.00 mg/dL 2.01(H) 2.41(H) 2.59(H)  Sodium 135 - 145 mmol/L 136 137 139  Potassium 3.5 - 5.1 mmol/L 3.5 3.4(L) 3.7  Chloride 101 - 111 mmol/L 110 109 113(H)  CO2 22 - 32 mmol/L 19(L) 19(L) 20(L)  Calcium 8.9 - 10.3 mg/dL 9.1 9.3 8.8(L)     Assessment: Principal Problem:    Subacute delirium Active Problems:   Alcohol abuse   ARF (acute renal failure) (HCC)  Isabella Bowens 42 y.o. female admitted with alcohlic hepatitis and liver failure , AKI. Commenced on prednisone 11/13/17 40 mg. No encephelopathy presently   Plan: 1. Calculate [name] score on 11/20/17 to decide duration of steroids and response .  2. Lactulose titrated to two soft bowel movements a day - if has diarrhea decrease dose as diarhea and dehydration worsens encephelopathy- stop checking ammonia. Continue Xifaxan  3. Stop all alcohol  4. Nutrition is key to recovery from alcoholic hepatitis along with cessation of alcohol. If dietician not seen patient then would recommend to do so  5. F/u with Dr Bonna Gains in the outpatient  6. EGD as an outpatient to screen for varices.    LOS: 11 days   Jonathon Bellows, MD 11/17/2017, 8:52 AM

## 2017-11-17 NOTE — Progress Notes (Signed)
Chaplain responded to OR. Pt was not able to answer where she was. Nurse will page when she is alert.    11/17/17 1300  Clinical Encounter Type  Visited With Patient;Family  Visit Type Follow-up  Spiritual Encounters  Spiritual Needs Brochure

## 2017-11-17 NOTE — Progress Notes (Signed)
[facility]  ROUNDING NOTE   Subjective:   Creatinine 2.01 (2.41)  NS at 9650mL/hr  Objective:  Vital signs in last 24 hours:  Temp:  [97.3 F (36.3 C)-98.3 F (36.8 C)] 97.3 F (36.3 C) (04/23 1325) Pulse Rate:  [83-100] 83 (04/23 1325) Resp:  [18-24] 18 (04/23 1325) BP: (97-102)/(56-76) 97/56 (04/23 1325) SpO2:  [95 %-99 %] 95 % (04/23 1325)  Weight change:  Filed Weights   14-Aug-2017 0751 14-Aug-2017 1426  Weight: 113.4 kg (250 lb) 116 kg (255 lb 11.7 oz)    Intake/Output: I/O last 3 completed shifts: In: 3220 [P.O.:300; I.V.:2548; IV Piggyback:372] Out: 1025 [Urine:1025]   Intake/Output this shift:  Total I/O In: 250 [Other:250] Out: 450 [Urine:450]  Physical Exam: General: NAD,   Head: Normocephalic, atraumatic. Moist oral mucosal membranes  Eyes: Anicteric, PERRL  Neck: Supple, trachea midline  Lungs:  Clear to auscultation  Heart: Regular rate and rhythm  Abdomen:  Soft, nontender,   Extremities: no peripheral edema.  Neurologic: Nonfocal, moving all four extremities  Skin: No lesions  Psych Flat affect    Basic Metabolic Panel: Recent Labs  Lab 11/11/17 0507 11/12/17 0459 11/15/17 0507 11/16/17 0517 11/17/17 0557  NA 134* 136 139 137 136  K 4.1 4.0 3.7 3.4* 3.5  CL 106 110 113* 109 110  CO2 22 20* 20* 19* 19*  GLUCOSE 113* 103* 139* 123* 143*  BUN 21* 22* 35* 37* 39*  CREATININE 1.22* 1.30* 2.59* 2.41* 2.01*  CALCIUM 8.6* 8.5* 8.8* 9.3 9.1    Liver Function Tests: Recent Labs  Lab 11/12/17 0459 11/14/17 0634 11/15/17 0507 11/16/17 0517 11/17/17 0557  AST 70* 82* 109* 129* 104*  ALT 21 25 29  36 33  ALKPHOS 60 65 63 64 63  BILITOT 8.6* 6.4* 5.0* 4.8* 4.1*  PROT 5.8* 6.4* 6.0* 7.1 6.7  ALBUMIN 2.3* 2.8* 2.6* 3.8 3.9   No results for input(s): LIPASE, AMYLASE in the last 168 hours. Recent Labs  Lab 11/15/17 0858 11/16/17 0913 11/17/17 0557  AMMONIA 117* 129* 176*    CBC: Recent Labs  Lab 11/12/17 0459  11/13/17 0530 11/14/17 0634 11/15/17 0507 11/17/17 0551  WBC 3.7 2.8* 5.5 3.5* 2.9*  NEUTROABS  --   --   --   --  2.3  HGB 9.4* 9.2* 9.3* 8.4* 7.8*  HCT 27.2* 26.4* 26.0* 24.7* 23.1*  MCV 107.9* 108.1* 108.6* 108.8* 109.1*  PLT 72* 74* 86* 78* 65*    Cardiac Enzymes: No results for input(s): CKTOTAL, CKMB, CKMBINDEX, TROPONINI in the last 168 hours.  BNP: Invalid input(s): POCBNP  CBG: No results for input(s): GLUCAP in the last 168 hours.  Microbiology: Results for orders placed or performed during the hospital encounter of 14-Aug-2017  Wet prep, genital     Status: Abnormal   Collection Time: 14-Aug-2017  8:31 AM  Result Value Ref Range Status   Yeast Wet Prep HPF POC NONE SEEN NONE SEEN Final   Trich, Wet Prep NONE SEEN NONE SEEN Final   Clue Cells Wet Prep HPF POC PRESENT (A) NONE SEEN Final   WBC, Wet Prep HPF POC MODERATE (A) NONE SEEN Final   Sperm NONE SEEN  Final    Comment: Performed at [facility], [address] [place], [place] [address]  Chlamydia/NGC rt PCR [facility] only)     Status: None   Collection Time: 14-Aug-2017  8:31 AM  Result Value Ref Range Status   Specimen source GC/Chlam CHLAMYDIA SPECIES  Final  Chlamydia Tr NOT DETECTED NOT DETECTED Final   N gonorrhoeae NOT DETECTED NOT DETECTED Final    Comment: (NOTE) 100  This methodology has not been evaluated in pregnant women or in 200  patients with a history of hysterectomy. 300 400  This methodology will not be performed on patients less than 68  years of age. Performed at [facility], [address] [place], [place] [address]     Coagulation Studies: Recent Labs    11/15/17 0507 11/16/17 0517 11/17/17 0557  LABPROT 29.1* 24.4* 24.8*  INR 2.78 2.22 2.26    Urinalysis: No results for input(s): COLORURINE, LABSPEC, PHURINE, GLUCOSEU, HGBUR, BILIRUBINUR, KETONESUR, PROTEINUR, UROBILINOGEN, NITRITE, LEUKOCYTESUR in the last 72 hours.  Invalid input(s): APPERANCEUR     Imaging: No results found.   Medications:   . sodium chloride    . sodium chloride 50 mL/hr at 11/17/17 0539  . albumin human     . ALPRAZolam  2 mg Oral QHS  . buPROPion  300 mg Oral Daily  . feeding supplement (ENSURE ENLIVE)  237 mL Oral BID BM  . folic acid  1 mg Oral Daily  . I-VITE PROTECT  1 tablet Oral Daily  . lactulose  20 g Oral BID  . lidocaine  1 patch Transdermal Q24H  . neomycin-bacitracin-polymyxin   Topical TID  . nystatin   Topical TID  . phytonadione  10 mg Oral Daily  . polyethylene glycol  17 g Oral BID  . predniSONE  40 mg Oral Daily  . rifaximin  550 mg Oral BID  . sodium chloride flush  3 mL Intravenous Q12H  . thiamine  100 mg Oral Daily   sodium chloride, alum & mag hydroxide-simeth, diazepam, ondansetron **OR** ondansetron (ZOFRAN) IV, oxyCODONE-acetaminophen, sodium chloride flush  Assessment/ Plan:  Brandy Cooper is a 42 y.o. white female anxiety, bipolar disorder, depression, alcohol abuse, who was admitted to [facility] on 11-30-2017 for evaluation of vaginal bleeding as well as recent fall.   1.  Acute renal failure.  2.  Hepatic steatosis/Alcoholic hepatitis.  3.  Hepatic encephalopathy.   Plan:  - IV albumin - IV fluids - Holding diuretics.    LOS: 11 Winter Jocelyn 4/23/20192:07 PM

## 2017-11-17 NOTE — Progress Notes (Signed)
[facility] INFECT[facility]OUS DISEASE PROGRESS NOTE Date of Admission:  11/03/2017     ID: Brandy Cooper is a 42 y.o. female with  Cirrhosis, + CMV IGM, HSV IGM  Principal Problem:   Subacute delirium Active Problems:   Alcohol abuse   ARF (acute renal failure) (HCC)   Subjective: Remains confused. Tells me she is being dced today and that the nurses have stolen her credit cards and "running them up" No fevers.   ROS  Eleven systems are reviewed and negative except per hpi  Medications:  Antibiotics Given (last 72 hours)    Date/Time Action Medication Dose   11/14/17 2142 Given   rifaximin (XIFAXAN) tablet 550 mg 550 mg   11/15/17 1003 Given   rifaximin (XIFAXAN) tablet 550 mg 550 mg   11/15/17 2203 Given   rifaximin (XIFAXAN) tablet 550 mg 550 mg   11/16/17 1025 Given   rifaximin (XIFAXAN) tablet 550 mg 550 mg   11/16/17 2113 Given   rifaximin (XIFAXAN) tablet 550 mg 550 mg   11/17/17 1014 Given   rifaximin (XIFAXAN) tablet 550 mg 550 mg     . ALPRAZolam  2 mg Oral QHS  . buPROPion  300 mg Oral Daily  . feeding supplement (ENSURE ENLIVE)  237 mL Oral BID BM  . folic acid  1 mg Oral Daily  . I-VITE PROTECT  1 tablet Oral Daily  . lactulose  20 g Oral BID  . lidocaine  1 patch Transdermal Q24H  . neomycin-bacitracin-polymyxin   Topical TID  . nystatin   Topical TID  . phytonadione  10 mg Oral Daily  . polyethylene glycol  17 g Oral BID  . predniSONE  40 mg Oral Daily  . rifaximin  550 mg Oral BID  . sodium chloride flush  3 mL Intravenous Q12H  . thiamine  100 mg Oral Daily    Objective: Vital signs in last 24 hours: Temp:  [97.3 F (36.3 C)-98.3 F (36.8 C)] 97.3 F (36.3 C) (04/23 1325) Pulse Rate:  [83-100] 83 (04/23 1325) Resp:  [18-24] 18 (04/23 1325) BP: (97-102)/(56-76) 97/56 (04/23 1325) SpO2:  [95 %-99 %] 95 % (04/23 1325) Constitutional:  Obese, very slow mentation HENT: [place]/AT, PERRLA, + scleral icterus Mouth/Throat: Oropharynx is clear and dry .  No oropharyngeal exudate.  Cardiovascular: Normal rate, regular rhythm and normal heart sound Pulmonary/Chest: Effort normal and breath sounds normal. No respiratory distress.  has no wheezes.  Neck = supple, no nuchal rigidity Abdominal: Soft. Obese  Bowel sounds are normal.  exhibits no distension. There is no tenderness.  Lymphadenopathy: no cervical adenopathy. No axillary adenopathy Neurological:very slow mentation, + asterexis Skin: bil dorsum of hands with dry scaly skin, some bruising  Psychiatric: lethargic     Lab Results Recent Labs    11/15/17 0507 11/16/17 0517 11/17/17 0551 11/17/17 0557  WBC 3.5*  --  2.9*  --   HGB 8.4*  --  7.8*  --   HCT 24.7*  --  23.1*  --   NA 139 137  --  136  K 3.7 3.4*  --  3.5  CL 113* 109  --  110  CO2 20* 19*  --  19*  BUN 35* 37*  --  39*  CREATININE 2.59* 2.41*  --  2.01*    Microbiology: Results for orders placed or performed during the hospital encounter of 11/20/2017  Wet prep, genital     Status: Abnormal   Collection Time: 11/13/2017  8:31  AM  Result Value Ref Range Status   Yeast Wet Prep HPF POC NONE SEEN NONE SEEN Final   Trich, Wet Prep NONE SEEN NONE SEEN Final   Clue Cells Wet Prep HPF POC PRESENT (A) NONE SEEN Final   WBC, Wet Prep HPF POC MODERATE (A) NONE SEEN Final   Sperm NONE SEEN  Final    Comment: Performed at [facility], [address] [place], [place] [address]  Chlamydia/NGC rt PCR [facility] only)     Status: None   Collection Time: 11/02/2017  8:31 AM  Result Value Ref Range Status   Specimen source GC/Chlam CHLAMYDIA SPECIES  Final   Chlamydia Tr NOT DETECTED NOT DETECTED Final   N gonorrhoeae NOT DETECTED NOT DETECTED Final    Comment: (NOTE) 100  This methodology has not been evaluated in pregnant women or in 200  patients with a history of hysterectomy. 300 400  This methodology will not be performed on patients less than 56  years of age. Performed at [facility], [address], [place], [place] [address]     Studies/Results: No results found.  Assessment/Plan: Brandy Cooper is a 42 y.o. female with heavy ETOH hx and new dx of cirrhosis.  Asked to see for + CMV IgM.  This is chronic liver disease given the severe hepatic steatosis on MRI, elevated INR and ammonia and TCP.  ALT and AST are only mildly elevated. She has no recent mono like illness. I think the + Igm for HSV and CMV are not related at all.   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 Continue to treat alcoholic hepatitis per GI recs Will sign off. Thank you very much for the consult.   Mick Sell   11/17/2017, 3:00 PM

## 2017-11-17 NOTE — Consult Note (Signed)
[facility] Consult   Reason for Consult: Repeat consult for 42 year old woman with cirrhosis because of new onset psychotic symptoms Referring Physician: Gouru Patient Identification: Brandy Cooper MRN:  [id number] Principal Diagnosis: Steroid-induced psychosis, with delusions [facility]) Diagnosis:   Patient Active Problem List   Diagnosis Date Noted  . 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 Subacute delirium [F05] 11/11/2017  . ARF (acute renal failure) ([place]) [N17.9] 11/11/2017  . Substance induced mood disorder ([place]) [F19.94] 04/04/2015  . Alcohol abuse [F10.10] 04/04/2015  . Benzodiazepine overdose [T42.4X1A] 04/04/2015    Total Time spent with patient: 1 hour  Subjective:   Brandy Cooper is a 42 y.o. female patient admitted with "they stole my credit cards".  HPI: This is her new consult but on a patient I had seen previously.  42 year old woman with a history of alcohol abuse now in the hospital with cirrhosis.  She has now developed psychotic symptoms.  I found the patient awake and conversant and not showing signs of delirium.  She launched right in however to telling me that people of stolen her credit cards and then telling me that she knows that they have run up huge charges that are "irreversible".  Also has complicated delusions about how they are going to tricked her into going home by shooting her with a "airgun".  Also says that people out in the hallway are talking about her.  Patient is anxious about this.  She denies suicidal or homicidal thoughts.  She is aware that all of this sounds bizarre but is not willing to consider the possibility that this could be induced by medicine.  Last time I saw the patient she was still having a mild intermittent degree of delirium but since then she has been on prednisone for her cirrhosis.  Social history: Had been living independently before coming into the hospital but says now she plans to  live with her parents at discharge.  Medical history: Cirrhosis.  Also mentioned that her ammonia level is over 170 today.  Also possible that she could still be having some benzodiazepine withdrawal although what she has today does not look like delirium.  Substance abuse history: Long-standing problems with alcohol abuse  Past Psychiatric History: Patient has long-standing alcohol problems and behavior problems related to it.  No history of suicide attempts or violence or previous psychosis  Risk to Self: Is patient at risk for suicide?: No Risk to Others:   Prior Inpatient Therapy:   Prior Outpatient Therapy:    Past Medical History:  Past Medical History:  Diagnosis Date  . Anxiety   . Bipolar affect, depressed ([place])   . Depression     Past Surgical History:  Procedure Laterality Date  . CHOLECYSTECTOMY    . LAPAROSCOPIC GASTRIC SLEEVE RESECTION    . TONSILLECTOMY     Family History:  Family History  Family history unknown: Yes   Family Psychiatric  History: None known Social History:  Social History   Substance and Sexual Activity  Alcohol Use No     Social History   Substance and Sexual Activity  Drug Use No    Social History   Socioeconomic History  . Marital status: Legally Separated    Spouse name: Not on file  . Number of children: Not on file  . Years of education: Not on file  . Highest education level: Not on file  Occupational History  . Not on file  Social Needs  .  Financial resource strain: Not on file  . Food insecurity:    Worry: Not on file    Inability: Not on file  . Transportation needs:    Medical: Not on file    Non-medical: Not on file  Tobacco Use  . Smoking status: Former Smoker    Types: Cigarettes  . Smokeless tobacco: Never Used  . Tobacco comment: quit since 2 months  Substance and Sexual Activity  . Alcohol use: No  . Drug use: No  . Sexual activity: Not on file  Lifestyle  . Physical activity:    Days per week:  Not on file    Minutes per session: Not on file  . Stress: Not on file  Relationships  . Social connections:    Talks on phone: Not on file    Gets together: Not on file    Attends religious service: Not on file    Active member of club or organization: Not on file    Attends meetings of clubs or organizations: Not on file    Relationship status: Not on file  Other Topics Concern  . Not on file  Social History Narrative   Lives by herself, unsteady gait, ambulates without any assisted device   Additional Social History:    Allergies:   Allergies  Allergen Reactions  . Celebrex [Celecoxib] Hives  . Morphine And Related Itching    Labs:  Results for orders placed or performed during the hospital encounter of 11/15/2017 (from the past 48 hour(s))  Protime-INR     Status: Abnormal   Collection Time: 11/16/17  5:17 AM  Result Value Ref Range   Prothrombin Time 24.4 (H) 11.4 - 15.2 seconds   INR 2.22     Comment: Performed at [facility], [address] [place], [place] [address]  Comprehensive metabolic panel     Status: Abnormal   Collection Time: 11/16/17  5:17 AM  Result Value Ref Range   Sodium 137 135 - 145 mmol/L   Potassium 3.4 (L) 3.5 - 5.1 mmol/L   Chloride 109 101 - 111 mmol/L   CO2 19 (L) 22 - 32 mmol/L   Glucose, Bld 123 (H) 65 - 99 mg/dL   BUN 37 (H) 6 - 20 mg/dL   Creatinine, Ser 2.41 (H) 0.44 - 1.00 mg/dL   Calcium 9.3 8.9 - 10.3 mg/dL   Total Protein 7.1 6.5 - 8.1 g/dL   Albumin 3.8 3.5 - 5.0 g/dL   AST 129 (H) 15 - 41 U/L   ALT 36 14 - 54 U/L   Alkaline Phosphatase 64 38 - 126 U/L   Total Bilirubin 4.8 (H) 0.3 - 1.2 mg/dL   GFR calc non Af Amer 24 (L) >60 mL/min   GFR calc Af Amer 28 (L) >60 mL/min    Comment: (NOTE) The eGFR has been calculated using the CKD EPI equation. This calculation has not been validated in all clinical situations. eGFR's persistently <60 mL/min signify possible Chronic Kidney Disease.    Anion gap 9 5 - 15     Comment: Performed at [facility], [address] [place], [place] [address]  Ammonia     Status: Abnormal   Collection Time: 11/16/17  9:13 AM  Result Value Ref Range   Ammonia 129 (H) 9 - 35 umol/L    Comment: Performed at [facility], [address] [place], [place] [address]  CBC with Differential/Platelet     Status: Abnormal   Collection Time:  11/17/17  5:51 AM  Result Value Ref Range   WBC 2.9 (L) 3.6 - 11.0 K/uL   RBC 2.12 (L) 3.80 - 5.20 MIL/uL   Hemoglobin 7.8 (L) 12.0 - 16.0 g/dL   HCT 23.1 (L) 35.0 - 47.0 %   MCV 109.1 (H) 80.0 - 100.0 fL   MCH 36.9 (H) 26.0 - 34.0 pg   MCHC 33.9 32.0 - 36.0 g/dL   RDW 23.6 (H) 11.5 - 14.5 %   Platelets 65 (L) 150 - 440 K/uL   Neutrophils Relative % 79 %   Neutro Abs 2.3 1.4 - 6.5 K/uL   Lymphocytes Relative 14 %   Lymphs Abs 0.4 (L) 1.0 - 3.6 K/uL   Monocytes Relative 6 %   Monocytes Absolute 0.2 0.2 - 0.9 K/uL   Eosinophils Relative 0 %   Eosinophils Absolute 0.0 0 - 0.7 K/uL   Basophils Relative 1 %   Basophils Absolute 0.0 0 - 0.1 K/uL    Comment: Performed at [facility], [address] [place], [place] [address]  Protime-INR     Status: Abnormal   Collection Time: 11/17/17  5:57 AM  Result Value Ref Range   Prothrombin Time 24.8 (H) 11.4 - 15.2 seconds   INR 2.26     Comment: Performed at [facility], [address] [place], [place] [address]  Comprehensive metabolic panel     Status: Abnormal   Collection Time: 11/17/17  5:57 AM  Result Value Ref Range   Sodium 136 135 - 145 mmol/L   Potassium 3.5 3.5 - 5.1 mmol/L   Chloride 110 101 - 111 mmol/L   CO2 19 (L) 22 - 32 mmol/L   Glucose, Bld 143 (H) 65 - 99 mg/dL   BUN 39 (H) 6 - 20 mg/dL   Creatinine, Ser 2.01 (H) 0.44 - 1.00 mg/dL   Calcium 9.1 8.9 - 10.3 mg/dL   Total Protein 6.7 6.5 - 8.1 g/dL   Albumin 3.9 3.5 - 5.0 g/dL   AST 104 (H) 15 - 41 U/L   ALT 33 14 - 54 U/L   Alkaline Phosphatase 63 38 - 126 U/L   Total  Bilirubin 4.1 (H) 0.3 - 1.2 mg/dL   GFR calc non Af Amer 30 (L) >60 mL/min   GFR calc Af Amer 34 (L) >60 mL/min    Comment: (NOTE) The eGFR has been calculated using the CKD EPI equation. This calculation has not been validated in all clinical situations. eGFR's persistently <60 mL/min signify possible Chronic Kidney Disease.    Anion gap 7 5 - 15    Comment: Performed at [facility], [address] [place], [place] [address]  Ammonia     Status: Abnormal   Collection Time: 11/17/17  5:57 AM  Result Value Ref Range   Ammonia 176 (H) 9 - 35 umol/L    Comment: Performed at [facility], [address] [place], [place] [address]    Current Facility-Administered Medications  Medication Dose Route Frequency Provider Last Rate Last Dose  . 0.9 %  sodium chloride infusion  250 mL Intravenous PRN Gladstone Lighter, MD      . 0.9 %  sodium chloride infusion   Intravenous Continuous Holley Raring, Munsoor, MD 50 mL/hr at 11/17/17 0539    . albumin human 25 % solution 25 g  25 g Intravenous Q8H Lateef, Munsoor, MD   25 g at 11/17/17 1436  . ALPRAZolam (XANAX) tablet 2 mg  2 mg Oral  Halford Chessman, MD   2 mg at 11/16/17 2158  . alum & mag hydroxide-simeth (MAALOX/MYLANTA) 200-200-20 MG/5ML suspension 30 mL  30 mL Oral Q6H PRN Gladstone Lighter, MD   30 mL at 11/08/17 2247  . buPROPion (WELLBUTRIN XL) 24 hr tablet 300 mg  300 mg Oral Daily Gladstone Lighter, MD   300 mg at 11/17/17 1025  . diazepam (VALIUM) tablet 5 mg  5 mg Oral Q8H PRN Gladstone Lighter, MD   5 mg at 11/14/17 0113  . feeding supplement (ENSURE ENLIVE) (ENSURE ENLIVE) liquid 237 mL  237 mL Oral BID BM Lin Landsman, MD   237 mL at 11/17/17 1437  . folic acid (FOLVITE) tablet 1 mg  1 mg Oral Daily Gladstone Lighter, MD   1 mg at 11/17/17 1013  . I-VITE PROTECT TABS 1 tablet  1 tablet Oral Daily Gouru, Aruna, MD   1 tablet at 11/17/17 1013  . lactulose (CHRONULAC) 10 GM/15ML solution 20 g  20 g Oral  BID Gouru, Aruna, MD      . lidocaine (LIDODERM) 5 % 1 patch  1 patch Transdermal Q24H Gladstone Lighter, MD   1 patch at 11/17/17 1014  . neomycin-bacitracin-polymyxin (NEOSPORIN) ointment   Topical TID Gouru, Aruna, MD      . nystatin (MYCOSTATIN/NYSTOP) topical powder   Topical TID Gouru, Aruna, MD      . OLANZapine zydis (ZYPREXA) disintegrating tablet 10 mg  10 mg Oral QHS ,  T, MD      . ondansetron (ZOFRAN) tablet 4 mg  4 mg Oral Q6H PRN Gladstone Lighter, MD       Or  . ondansetron (ZOFRAN) injection 4 mg  4 mg Intravenous Q6H PRN Gladstone Lighter, MD      . oxyCODONE-acetaminophen (PERCOCET/ROXICET) 5-325 MG per tablet 1 tablet  1 tablet Oral Q6H PRN Gladstone Lighter, MD   1 tablet at 11/16/17 2111  . phytonadione (VITAMIN K) tablet 10 mg  10 mg Oral Daily Lin Landsman, MD   10 mg at 11/17/17 1014  . polyethylene glycol (MIRALAX / GLYCOLAX) packet 17 g  17 g Oral BID Lin Landsman, MD   17 g at 11/17/17 1012  . predniSONE (DELTASONE) tablet 40 mg  40 mg Oral Daily Gouru, Aruna, MD   40 mg at 11/17/17 1012  . rifaximin (XIFAXAN) tablet 550 mg  550 mg Oral BID Gladstone Lighter, MD   550 mg at 11/17/17 1014  . sodium chloride flush (NS) 0.9 % injection 3 mL  3 mL Intravenous Q12H Gladstone Lighter, MD   3 mL at 11/17/17 1026  . sodium chloride flush (NS) 0.9 % injection 3 mL  3 mL Intravenous PRN Gladstone Lighter, MD      . thiamine (VITAMIN B-1) tablet 100 mg  100 mg Oral Daily Marius Ditch, Tally Due, MD   100 mg at 11/17/17 1013    Musculoskeletal: Strength & Muscle Tone: decreased Gait & Station: unsteady Patient leans: N/A  Psychiatric Specialty Exam: Physical Exam  Nursing note and vitals reviewed. Constitutional: She appears well-developed and well-nourished.  HENT:  Head: Normocephalic and atraumatic.  Eyes: Pupils are equal, round, and reactive to light. Conjunctivae are normal.  Neck: Normal range of motion.  Cardiovascular: Regular rhythm  and normal heart sounds.  Respiratory: Effort normal. No respiratory distress.  GI: Soft.  Musculoskeletal: Normal range of motion.  Neurological: She is alert.  Skin: Skin is warm and dry.  Psychiatric: Her mood appears anxious. Her  speech is delayed. She is agitated and slowed. Thought content is paranoid and delusional. Cognition and memory are impaired. She expresses impulsivity. She expresses no homicidal and no suicidal ideation.    Review of Systems  Constitutional: Negative.   HENT: Negative.   Eyes: Negative.   Respiratory: Negative.   Cardiovascular: Negative.   Gastrointestinal: Negative.   Musculoskeletal: Negative.   Skin: Negative.   Neurological: Negative.   Psychiatric/Behavioral: Positive for hallucinations. Negative for depression, memory loss, substance abuse and suicidal ideas. The patient is nervous/anxious and has insomnia.     Blood pressure (!) 97/56, pulse 83, temperature (!) 97.3 F (36.3 C), temperature source Oral, resp. rate 18, height 5' 6" (1.676 m), weight 116 kg (255 lb 11.7 oz), SpO2 95 %.Body mass index is 41.28 kg/m.  General Appearance: Casual  Eye Contact:  Good  Speech:  Clear and Coherent  Volume:  Normal  Mood:  Anxious  Affect:  Congruent  Thought Process:  Goal Directed  Orientation:  Full (Time, Place, and Person)  Thought Content:  Illogical, Delusions and Hallucinations: Auditory  Suicidal Thoughts:  No  Homicidal Thoughts:  No  Memory:  Immediate;   Fair Recent;   Poor Remote;   Poor  Judgement:  Impaired  Insight:  Lacking  Psychomotor Activity:  Normal  Concentration:  Concentration: Fair  Recall:  [employer] of Knowledge:  Fair  Language:  Fair  Akathisia:  No  Handed:  Right  AIMS (if indicated):     Assets:  Desire for Improvement Resilience  ADL's:  Impaired  Cognition:  Impaired,  Mild  Sleep:        Treatment Plan Summary: Daily contact with patient to assess and evaluate symptoms and progress in treatment,  Medication management and Plan Patient seen chart reviewed.  This 42 year old woman presents quite differently than the last time I saw her.  She is not delirious today but rather is having delusions hallucinations and paranoia.  Fortunately she is not acting out in an aggressive manner right now.  Most likely causes would be a combination of recent corticosteroid therapy, continued effects of elevated ammonia and liver disease and possibly benzodiazepine withdrawal although I think that is less likely.  I tried to convince the patient that adding Zyprexa to help her sleep and for her "nerves" at night would be of some benefit.  Hopefully she will cooperate with that as I think without it this is likely to go on for quite a while and could even get worse.  Zyprexa 10 mg oral dissolving ordered at night.  I will follow-up as needed.  Disposition: See note above  Alethia Berthold, MD 11/17/2017 7:31 PM
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
## 2017-11-18 LAB — COMPREHENSIVE METABOLIC PANEL
ALT: 41 U/L (ref 14–54)
AST: 121 U/L — AB (ref 15–41)
Albumin: 4.3 g/dL (ref 3.5–5.0)
Alkaline Phosphatase: 60 U/L (ref 38–126)
Anion gap: 9 (ref 5–15)
BILIRUBIN TOTAL: 5.3 mg/dL — AB (ref 0.3–1.2)
BUN: 38 mg/dL — AB (ref 6–20)
CALCIUM: 9.6 mg/dL (ref 8.9–10.3)
CO2: 19 mmol/L — ABNORMAL LOW (ref 22–32)
CREATININE: 1.46 mg/dL — AB (ref 0.44–1.00)
Chloride: 113 mmol/L — ABNORMAL HIGH (ref 101–111)
GFR calc non Af Amer: 44 mL/min — ABNORMAL LOW (ref >60)
GFR, EST AFRICAN AMERICAN: 51 mL/min — AB (ref >60)
Glucose, Bld: 153 mg/dL — ABNORMAL HIGH (ref 65–99)
Potassium: 3.4 mmol/L — ABNORMAL LOW (ref 3.5–5.1)
Sodium: 141 mmol/L (ref 135–145)
TOTAL PROTEIN: 7.2 g/dL (ref 6.5–8.1)

## 2017-11-18 LAB — PROTIME-INR
INR: 2.42
PROTHROMBIN TIME: 26.1 s — AB (ref 11.4–15.2)

## 2017-11-18 LAB — AMMONIA: Ammonia: 110 umol/L — ABNORMAL HIGH (ref 9–35)
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
## 2017-11-18 NOTE — Progress Notes (Signed)
Nutrition Brief Note  RD consulted for diet education. Diet education regarding high protein foods was provided by Helane RimaLeanne Stephens, RD, on 11/15/17.  Pt not appropriate for further education at this time as pt is experiencing new onset psychotic symptoms.  Please re-consult RD when pt is alert and oriented. RD is following pt.  Earma ReadingKate Jablonski Lene Mckay, MS, RD, LDN Pager: [phone] Weekend/After Hours: [phone]

## 2017-11-18 NOTE — Progress Notes (Signed)
[facility]  ROUNDING NOTE   Subjective:   No new labs today.   Objective:  Vital signs in last 24 hours:  Temp:  [98 F (36.7 C)-98.4 F (36.9 C)] 98 F (36.7 C) (04/24 1201) Pulse Rate:  [85-114] 87 (04/24 1203) Resp:  [16-18] 16 (04/24 1201) BP: (123-161)/(60-131) 123/60 (04/24 1203) SpO2:  [100 %] 100 % (04/24 1201)  Weight change:  Filed Weights   10/27/2017 0751 11/08/2017 1426  Weight: 113.4 kg (250 lb) 116 kg (255 lb 11.7 oz)    Intake/Output: I/O last 3 completed shifts: In: 3383 [P.O.:180; I.V.:2640; Other:250; IV Piggyback:313] Out: 2025 [Urine:2025]   Intake/Output this shift:  Total I/O In: 150 [Other:150] Out: -   Physical Exam: General: NAD,   Head: Normocephalic, atraumatic. Moist oral mucosal membranes  Eyes: Anicteric, PERRL  Neck: Supple, trachea midline  Lungs:  Clear to auscultation  Heart: Regular rate and rhythm  Abdomen:  Soft, nontender,   Extremities: no peripheral edema.  Neurologic: Nonfocal, moving all four extremities  Skin: No lesions  Psych Flat affect    Basic Metabolic Panel: Recent Labs  Lab 11/12/17 0459 11/15/17 0507 11/16/17 0517 11/17/17 0557  NA 136 139 137 136  K 4.0 3.7 3.4* 3.5  CL 110 113* 109 110  CO2 20* 20* 19* 19*  GLUCOSE 103* 139* 123* 143*  BUN 22* 35* 37* 39*  CREATININE 1.30* 2.59* 2.41* 2.01*  CALCIUM 8.5* 8.8* 9.3 9.1    Liver Function Tests: Recent Labs  Lab 11/12/17 0459 11/14/17 0634 11/15/17 0507 11/16/17 0517 11/17/17 0557  AST 70* 82* 109* 129* 104*  ALT 21 25 29  36 33  ALKPHOS 60 65 63 64 63  BILITOT 8.6* 6.4* 5.0* 4.8* 4.1*  PROT 5.8* 6.4* 6.0* 7.1 6.7  ALBUMIN 2.3* 2.8* 2.6* 3.8 3.9   No results for input(s): LIPASE, AMYLASE in the last 168 hours. Recent Labs  Lab 11/15/17 0858 11/16/17 0913 11/17/17 0557  AMMONIA 117* 129* 176*    CBC: Recent Labs  Lab 11/12/17 0459 11/13/17 0530 11/14/17 0634 11/15/17 0507 11/17/17 0551  WBC 3.7 2.8* 5.5 3.5*  2.9*  NEUTROABS  --   --   --   --  2.3  HGB 9.4* 9.2* 9.3* 8.4* 7.8*  HCT 27.2* 26.4* 26.0* 24.7* 23.1*  MCV 107.9* 108.1* 108.6* 108.8* 109.1*  PLT 72* 74* 86* 78* 65*    Cardiac Enzymes: No results for input(s): CKTOTAL, CKMB, CKMBINDEX, TROPONINI in the last 168 hours.  BNP: Invalid input(s): POCBNP  CBG: No results for input(s): GLUCAP in the last 168 hours.  Microbiology: Results for orders placed or performed during the hospital encounter of 11/21/2017  Wet prep, genital     Status: Abnormal   Collection Time: 10/27/2017  8:31 AM  Result Value Ref Range Status   Yeast Wet Prep HPF POC NONE SEEN NONE SEEN Final   Trich, Wet Prep NONE SEEN NONE SEEN Final   Clue Cells Wet Prep HPF POC PRESENT (A) NONE SEEN Final   WBC, Wet Prep HPF POC MODERATE (A) NONE SEEN Final   Sperm NONE SEEN  Final    Comment: Performed at [facility], [address] [place], [place] [address]  Chlamydia/NGC rt PCR [facility] only)     Status: None   Collection Time: 11/05/2017  8:31 AM  Result Value Ref Range Status   Specimen source GC/Chlam CHLAMYDIA SPECIES  Final   Chlamydia Tr NOT DETECTED NOT DETECTED Final   N  gonorrhoeae NOT DETECTED NOT DETECTED Final    Comment: (NOTE) 100  This methodology has not been evaluated in pregnant women or in 200  patients with a history of hysterectomy. 300 400  This methodology will not be performed on patients less than 2114  years of age. Performed at [facility], [address] [place], [place] [address]     Coagulation Studies: Recent Labs    11/16/17 0517 11/17/17 0557  LABPROT 24.4* 24.8*  INR 2.22 2.26    Urinalysis: No results for input(s): COLORURINE, LABSPEC, PHURINE, GLUCOSEU, HGBUR, BILIRUBINUR, KETONESUR, PROTEINUR, UROBILINOGEN, NITRITE, LEUKOCYTESUR in the last 72 hours.  Invalid input(s): APPERANCEUR    Imaging: No results found.   Medications:   . sodium chloride    . sodium chloride 50 mL/hr at 11/18/17  0650  . albumin human     . ALPRAZolam  2 mg Oral QHS  . buPROPion  300 mg Oral Daily  . feeding supplement (ENSURE ENLIVE)  237 mL Oral BID BM  . folic acid  1 mg Oral Daily  . I-VITE PROTECT  1 tablet Oral Daily  . lactulose  20 g Oral BID  . lidocaine  1 patch Transdermal Q24H  . neomycin-bacitracin-polymyxin   Topical TID  . nystatin   Topical TID  . OLANZapine zydis  10 mg Oral QHS  . phytonadione  10 mg Oral Daily  . polyethylene glycol  17 g Oral BID  . predniSONE  40 mg Oral Daily  . rifaximin  550 mg Oral BID  . sodium chloride flush  3 mL Intravenous Q12H  . thiamine  100 mg Oral Daily   sodium chloride, alum & mag hydroxide-simeth, diazepam, OLANZapine, ondansetron **OR** ondansetron (ZOFRAN) IV, oxyCODONE-acetaminophen, sodium chloride flush  Assessment/ Plan:  Ms. Manon Hildingngela M Jordan is a 42 y.o. white female anxiety, bipolar disorder, depression, alcohol abuse, who [facility] for evaluation of vaginal bleeding as well as recent fall.   1.  Acute renal failure.  2.  Hepatic steatosis/Alcoholic hepatitis.  3.  Hepatic encephalopathy.  4. Anemia with kidney failure  Plan:  - Discontinue IV fluids - Discontinue IV albumin - Holding diuretics.  - Labs for tomorrow.    LOS: 12 Dashon Mcintire 4/24/20192:17 PM

## 2017-11-18 NOTE — Progress Notes (Signed)
PT Cancellation Note  Patient Details Name: Brandy Cooper MRN: [id number] DOB: 1976-04-14   Cancelled Treatment:    Reason Eval/Treat Not Completed: Patient declined, no reason specified. Per chart review, pt has had some new emotional/delusional episodes this morning. Spoke with nurse and nurse notes pt has been doing better, but continues to feel upset; states okay to attempt treatment. Treatment attempted; pt sitting in chair, shaking reportedly due to nervousness. Pt refuses pt at this time despite gentle encouragement stating that she just does not wish to, as she is too emotionally upset right now. Re attempt at a later time, as the schedule allows.   Scot DockHeidi E Lijah Bourque, PTA 11/18/2017, 11:21 AM

## 2017-11-18 NOTE — Consult Note (Signed)
[place] Consult   Reason for Consult: Follow-up consult for 42 year old woman most recently seeming to develop psychotic symptoms on top of the previous delirium. Referring Physician: Gouru Patient Identification: Brandy Cooper MRN:  [id number] Principal Diagnosis: Steroid-induced psychosis, with delusions [facility]) Diagnosis:   Patient Active Problem List   Diagnosis Date Noted  . Steroid-induced psychosis, with delusions ([place]) [F19.950] 11/17/2017  . Subacute delirium [F05] 11/11/2017  . ARF (acute renal failure) ([place]) [N17.9] 11/24/2017  . Substance induced mood disorder ([place]) [F19.94] 04/04/2015  . Alcohol abuse [F10.10] 04/04/2015  . Benzodiazepine overdose [T42.4X1A] 04/04/2015    Total Time spent with patient: 30 minutes  Subjective:   Brandy Cooper is a 42 y.o. female patient admitted with "it is okay".  HPI: Patient seen chart reviewed.  I saw the patient yesterday evening at which point she was agitated paranoid psychotic.  My best estimate is that she has developed a psychosis from the steroids although this being on top of her elevated ammonia and her encephalopathy from cirrhosis provides multiple possible etiologies.  Patient was endangering her treatment at times yesterday becoming agitated pacing around behaving in a paranoid manner with patients and staff.  I had prescribed olanzapine at night which she was given last night.  On evaluation today the patient is sleepy.  I also noticed that she is having blood in her urine nurses are working with her with a catheter this morning.  Past Psychiatric History: Long history of alcohol abuse now with cirrhosis and encephalopathy related  Risk to Self: Is patient at risk for suicide?: No Risk to Others:   Prior Inpatient Therapy:   Prior Outpatient Therapy:    Past Medical History:  Past Medical History:  Diagnosis Date  . Anxiety   . Bipolar affect, depressed ([place])   . Depression     Past Surgical  History:  Procedure Laterality Date  . CHOLECYSTECTOMY    . LAPAROSCOPIC GASTRIC SLEEVE RESECTION    . TONSILLECTOMY     Family History:  Family History  Family history unknown: Yes   Family Psychiatric  History: Alcohol Social History:  Social History   Substance and Sexual Activity  Alcohol Use No     Social History   Substance and Sexual Activity  Drug Use No    Social History   Socioeconomic History  . Marital status: Legally Separated    Spouse name: Not on file  . Number of children: Not on file  . Years of education: Not on file  . Highest education level: Not on file  Occupational History  . Not on file  Social Needs  . Financial resource strain: Not on file  . Food insecurity:    Worry: Not on file    Inability: Not on file  . Transportation needs:    Medical: Not on file    Non-medical: Not on file  Tobacco Use  . Smoking status: Former Smoker    Types: Cigarettes  . Smokeless tobacco: Never Used  . Tobacco comment: quit since 2 months  Substance and Sexual Activity  . Alcohol use: No  . Drug use: No  . Sexual activity: Not on file  Lifestyle  . Physical activity:    Days per week: Not on file    Minutes per session: Not on file  . Stress: Not on file  Relationships  . Social connections:    Talks on phone: Not on file    Gets together: Not on file  Attends religious service: Not on file    Active member of club or organization: Not on file    Attends meetings of clubs or organizations: Not on file    Relationship status: Not on file  Other Topics Concern  . Not on file  Social History Narrative   Lives by herself, unsteady gait, ambulates without any assisted device   Additional Social History:    Allergies:   Allergies  Allergen Reactions  . Celebrex [Celecoxib] Hives  . Morphine And Related Itching    Labs:  Results for orders placed or performed during the hospital encounter of 11/01/2017 (from the past 48 hour(s))  CBC with  Differential/Platelet     Status: Abnormal   Collection Time: 11/17/17  5:51 AM  Result Value Ref Range   WBC 2.9 (L) 3.6 - 11.0 K/uL   RBC 2.12 (L) 3.80 - 5.20 MIL/uL   Hemoglobin 7.8 (L) 12.0 - 16.0 g/dL   HCT 23.1 (L) 35.0 - 47.0 %   MCV 109.1 (H) 80.0 - 100.0 fL   MCH 36.9 (H) 26.0 - 34.0 pg   MCHC 33.9 32.0 - 36.0 g/dL   RDW 23.6 (H) 11.5 - 14.5 %   Platelets 65 (L) 150 - 440 K/uL   Neutrophils Relative % 79 %   Neutro Abs 2.3 1.4 - 6.5 K/uL   Lymphocytes Relative 14 %   Lymphs Abs 0.4 (L) 1.0 - 3.6 K/uL   Monocytes Relative 6 %   Monocytes Absolute 0.2 0.2 - 0.9 K/uL   Eosinophils Relative 0 %   Eosinophils Absolute 0.0 0 - 0.7 K/uL   Basophils Relative 1 %   Basophils Absolute 0.0 0 - 0.1 K/uL    Comment: Performed at [facility], [address] [place], [place] [address]  Protime-INR     Status: Abnormal   Collection Time: 11/17/17  5:57 AM  Result Value Ref Range   Prothrombin Time 24.8 (H) 11.4 - 15.2 seconds   INR 2.26     Comment: Performed at [facility], [address] [place], [place] [address]  Comprehensive metabolic panel     Status: Abnormal   Collection Time: 11/17/17  5:57 AM  Result Value Ref Range   Sodium 136 135 - 145 mmol/L   Potassium 3.5 3.5 - 5.1 mmol/L   Chloride 110 101 - 111 mmol/L   CO2 19 (L) 22 - 32 mmol/L   Glucose, Bld 143 (H) 65 - 99 mg/dL   BUN 39 (H) 6 - 20 mg/dL   Creatinine, Ser 2.01 (H) 0.44 - 1.00 mg/dL   Calcium 9.1 8.9 - 10.3 mg/dL   Total Protein 6.7 6.5 - 8.1 g/dL   Albumin 3.9 3.5 - 5.0 g/dL   AST 104 (H) 15 - 41 U/L   ALT 33 14 - 54 U/L   Alkaline Phosphatase 63 38 - 126 U/L   Total Bilirubin 4.1 (H) 0.3 - 1.2 mg/dL   GFR calc non Af Amer 30 (L) >60 mL/min   GFR calc Af Amer 34 (L) >60 mL/min    Comment: (NOTE) The eGFR has been calculated using the CKD EPI equation. This calculation has not been validated in all clinical situations. eGFR's persistently <60 mL/min signify possible Chronic  Kidney Disease.    Anion gap 7 5 - 15    Comment: Performed at [facility], [address] [place], [place] [address]  Ammonia     Status: Abnormal   Collection Time: 11/17/17  5:57 AM  Result Value Ref Range   Ammonia 176 (H) 9 - 35 umol/L    Comment: Performed at [facility], [address] [place], [place] [address]    Current Facility-Administered Medications  Medication Dose Route Frequency Provider Last Rate Last Dose  . 0.9 %  sodium chloride infusion  250 mL Intravenous PRN Gladstone Lighter, MD      . 0.9 %  sodium chloride infusion   Intravenous Continuous Holley Raring, Munsoor, MD 50 mL/hr at 11/18/17 0650    . albumin human 25 % solution 25 g  25 g Intravenous Q8H Lateef, Munsoor, MD   25 g at 11/18/17 0546  . ALPRAZolam Duanne Moron) tablet 2 mg  2 mg Oral QHS Gladstone Lighter, MD   2 mg at 11/17/17 2003  . alum & mag hydroxide-simeth (MAALOX/MYLANTA) 200-200-20 MG/5ML suspension 30 mL  30 mL Oral Q6H PRN Gladstone Lighter, MD   30 mL at 11/08/17 2247  . buPROPion (WELLBUTRIN XL) 24 hr tablet 300 mg  300 mg Oral Daily Gladstone Lighter, MD   300 mg at 11/18/17 1031  . diazepam (VALIUM) tablet 5 mg  5 mg Oral Q8H PRN Gladstone Lighter, MD   5 mg at 11/14/17 0113  . feeding supplement (ENSURE ENLIVE) (ENSURE ENLIVE) liquid 237 mL  237 mL Oral BID BM Lin Landsman, MD   237 mL at 11/17/17 1437  . folic acid (FOLVITE) tablet 1 mg  1 mg Oral Daily Gladstone Lighter, MD   1 mg at 11/18/17 1031  . I-VITE PROTECT TABS 1 tablet  1 tablet Oral Daily Gouru, Aruna, MD   1 tablet at 11/18/17 1033  . lactulose (CHRONULAC) 10 GM/15ML solution 20 g  20 g Oral BID Gouru, Aruna, MD   20 g at 11/18/17 1033  . lidocaine (LIDODERM) 5 % 1 patch  1 patch Transdermal Q24H Gladstone Lighter, MD   1 patch at 11/18/17 1034  . neomycin-bacitracin-polymyxin (NEOSPORIN) ointment   Topical TID Gouru, Aruna, MD      . nystatin (MYCOSTATIN/NYSTOP) topical powder   Topical TID Gouru,  Aruna, MD      . OLANZapine (ZYPREXA) injection 10 mg  10 mg Intramuscular Once PRN Clapacs, John T, MD      . OLANZapine zydis (ZYPREXA) disintegrating tablet 10 mg  10 mg Oral QHS Clapacs, Madie Reno, MD   10 mg at 11/17/17 2003  . ondansetron (ZOFRAN) tablet 4 mg  4 mg Oral Q6H PRN Gladstone Lighter, MD       Or  . ondansetron (ZOFRAN) injection 4 mg  4 mg Intravenous Q6H PRN Gladstone Lighter, MD      . oxyCODONE-acetaminophen (PERCOCET/ROXICET) 5-325 MG per tablet 1 tablet  1 tablet Oral Q6H PRN Gladstone Lighter, MD   1 tablet at 11/16/17 2111  . phytonadione (VITAMIN K) tablet 10 mg  10 mg Oral Daily Lin Landsman, MD   10 mg at 11/18/17 1034  . polyethylene glycol (MIRALAX / GLYCOLAX) packet 17 g  17 g Oral BID Lin Landsman, MD   17 g at 11/18/17 1034  . predniSONE (DELTASONE) tablet 40 mg  40 mg Oral Daily Gouru, Aruna, MD   40 mg at 11/18/17 1033  . rifaximin (XIFAXAN) tablet 550 mg  550 mg Oral BID Gladstone Lighter, MD   550 mg at 11/18/17 1034  . sodium chloride flush (NS) 0.9 % injection 3 mL  3 mL Intravenous Q12H Gladstone Lighter, MD   3 mL at 11/18/17  1035  . sodium chloride flush (NS) 0.9 % injection 3 mL  3 mL Intravenous PRN Gladstone Lighter, MD      . thiamine (VITAMIN B-1) tablet 100 mg  100 mg Oral Daily Lin Landsman, MD   100 mg at 11/18/17 1033    Musculoskeletal: Strength & Muscle Tone: decreased Gait & Station: unable to stand Patient leans: N/A  Psychiatric Specialty Exam: Physical Exam  Nursing note and vitals reviewed. Constitutional: She appears well-developed and well-nourished.  HENT:  Head: Normocephalic and atraumatic.  Eyes: Pupils are equal, round, and reactive to light. Conjunctivae are normal.  Neck: Normal range of motion.  Cardiovascular: Regular rhythm and normal heart sounds.  Respiratory: Effort normal. No respiratory distress.  GI: Soft. She exhibits distension.  Musculoskeletal: Normal range of motion.   Neurological: She is alert.  Skin: Skin is warm and dry.  Psychiatric: Her affect is blunt. Her speech is delayed. She is slowed. Thought content is paranoid. Cognition and memory are impaired. She expresses no suicidal ideation.    Review of Systems  Unable to perform ROS: Mental status change    Blood pressure 134/88, pulse 99, temperature 98.4 F (36.9 C), temperature source Oral, resp. rate 18, height '5\' 6"'  (1.676 m), weight 116 kg (255 lb 11.7 oz), SpO2 100 %.Body mass index is 41.28 kg/m.  General Appearance: Disheveled  Eye Contact:  Minimal  Speech:  Slow  Volume:  Decreased  Mood:  Dysphoric  Affect:  Constricted  Thought Process:  Disorganized  Orientation:  Negative  Thought Content:  Illogical  Suicidal Thoughts:  No  Homicidal Thoughts:  No  Memory:  Immediate;   Fair Recent;   Poor Remote;   Poor  Judgement:  Poor  Insight:  Shallow  Psychomotor Activity:  Decreased  Concentration:  Concentration: Poor  Recall:  Poor  Fund of Knowledge:  Poor  Language:  Poor  Akathisia:  No  Handed:  Right  AIMS (if indicated):     Assets:  Social Support  ADL's:  Impaired  Cognition:  Impaired,  Moderate  Sleep:        Treatment Plan Summary: Daily contact with patient to assess and evaluate symptoms and progress in treatment, Medication management and Plan Patient seen this morning.  I was only able to interact with her for a short time but she looks much more sleepy today with her multiple medical problems it will be a challenge to try to control the paranoia and agitation without over sedating her.  The modest dose of Zyprexa at night should be a good start.  I will try to follow up later as well to see how she is doing..  Disposition: No evidence of imminent risk to self or others at present.   Patient does not meet criteria for psychiatric inpatient admission. Supportive therapy provided about ongoing stressors.  Alethia Berthold, MD 11/18/2017 11:31 AM

## 2017-11-18 NOTE — Progress Notes (Signed)
   Wyline MoodKiran Shoaib Siefker , MD [address], [place], [place], [address] [address], [place], [place], [address] Phone: [phone]  Fax: [phone]   Manon Hildingngela M Nigg is being followed for alcoholic hepatitis    Subjective: No complaints    Objective: Vital signs in last 24 hours: Vitals:   11/17/17 0617 11/17/17 1325 11/17/17 1933 11/18/17 0234  BP: 102/76 (!) 97/56 136/82 134/88  Pulse: 92 83 (!) 114 99  Resp: (!) 24 18 18 18   Temp: 98.1 F (36.7 C) (!) 97.3 F (36.3 C) 98.2 F (36.8 C) 98.4 F (36.9 C)  TempSrc: Oral Oral Oral Oral  SpO2: 99% 95% 100% 100%  Weight:      Height:       Weight change:   Intake/Output Summary (Last 24 hours) at 11/18/2017 1157 Last data filed at 11/18/2017 1100 Gross per 24 hour  Intake 1194 ml  Output 1400 ml  Net -206 ml     Exam: Heart:: Regular rate and rhythm, S1S2 present or without murmur or extra heart sounds Lungs: normal, clear to auscultation and clear to auscultation and percussion Abdomen: soft, nontender, normal bowel sounds   Lab Results: @LABTEST2 @ Micro Results: No results found for this or any previous visit (from the past 240 hour(s)). Studies/Results: No results found. Medications: I have reviewed the patient's current medications. Scheduled Meds: . ALPRAZolam  2 mg Oral QHS  . buPROPion  300 mg Oral Daily  . feeding supplement (ENSURE ENLIVE)  237 mL Oral BID BM  . folic acid  1 mg Oral Daily  . I-VITE PROTECT  1 tablet Oral Daily  . lactulose  20 g Oral BID  . lidocaine  1 patch Transdermal Q24H  . neomycin-bacitracin-polymyxin   Topical TID  . nystatin   Topical TID  . OLANZapine zydis  10 mg Oral QHS  . phytonadione  10 mg Oral Daily  . polyethylene glycol  17 g Oral BID  . predniSONE  40 mg Oral Daily  . rifaximin  550 mg Oral BID  . sodium chloride flush  3 mL Intravenous Q12H  . thiamine  100 mg Oral Daily   Continuous Infusions: . sodium chloride    . sodium chloride 50  mL/hr at 11/18/17 0650  . albumin human     PRN Meds:.sodium chloride, alum & mag hydroxide-simeth, diazepam, OLANZapine, ondansetron **OR** ondansetron (ZOFRAN) IV, oxyCODONE-acetaminophen, sodium chloride flush   Assessment: Principal Problem:   Steroid-induced psychosis, with delusions (HCC) Active Problems:   Alcohol abuse   ARF (acute renal failure) (HCC)   Subacute delirium  Manon HildingAngela M Plemmons 42 y.o. female admitted with alcohlic hepatitis and liver failure , AKI. Commenced on prednisone 11/13/17 40 mg. No encephelopathy presently liver function tests's improving   Plan: 1. Calculate [name] score on 11/20/17 to decide duration of steroids and response .  2. Lactulose titrated to two soft bowel movements a day - if has diarrhea decrease dose as diarhea and dehydration worsens encephelopathy- stop checking ammonia. Continue Xifaxan  3. Stop all alcohol  4. Nutrition is key to recovery from alcoholic hepatitis along with cessation of alcohol. If dietician not seen patient then would recommend to do so  5. F/u with Dr Maximino Greenlandahiliani in the outpatient  6. EGD as an outpatient to screen for varices.     LOS: 12 days   Wyline MoodKiran Annielee Jemmott, MD 11/18/2017, 11:57 AM

## 2017-11-18 NOTE — Progress Notes (Signed)
Patient thinks she is in a cage and that we are holding her hostage. She was confuse and trying to knock on other patients door while walking with the safety sitter.

## 2017-11-18 NOTE — Progress Notes (Signed)
[facility] [place] [facility]   PATIENT NAME: Brandy Cooper    MR#:  [id number]  DATE OF BIRTH:  Oct 23, 1975  SUBJECTIVE:  CHIEF COMPLAINT:   Chief Complaint  Patient presents with  . Vaginal Bleeding  Patient is ok, lethargic today but answers  few questions, seen by psychiatry yesterday no family members at bedside REVIEW OF SYSTEMS:  Limited review of system  cONSTITUTIONAL: No fever, fatigue  EYES: No blurred or double vision.  EARS, NOSE, AND THROAT: No tinnitus or ear pain.  RESPIRATORY: No cough, shortness of breath, wheezing or hemoptysis.  CARDIOVASCULAR: No chest pain, orthopnea, edema.  GASTROINTESTINAL: No nausea, vomiting, diarrhea or abdominal pain.  GENITOURINARY: No dysuria, hematuria.  SKIN: No rash or lesion.    DRUG ALLERGIES:   Allergies  Allergen Reactions  . Celebrex [Celecoxib] Hives  . Morphine And Related Itching    VITALS:  Blood pressure 123/60, pulse 87, temperature 98 F (36.7 C), temperature source Oral, resp. rate 16, height 5\' 6"  (1.676 m), weight 116 kg (255 lb 11.7 oz), SpO2 100 %.  PHYSICAL EXAMINATION:  Physical Exam  GENERAL:  42 y.o.-year-old patient lying in the bed with no acute distress.  EYES: Pupils equal, round, reactive to light and accommodation.  Has scleral icterus. Extraocular muscles intact. Icteric sclerae HEENT: Head atraumatic, normocephalic. Oropharynx and nasopharynx clear.  NECK:  Supple, no jugular venous distention. No thyroid enlargement, no tenderness.  LUNGS: Normal breath sounds bilaterally, no wheezing, rales,rhonchi or crepitation. No use of accessory muscles of respiration. Decreased bibasilar breath sounds CARDIOVASCULAR: S1, S2 normal. No murmurs, rubs, or gallops.  ABDOMEN: Soft, obese, nontender, nondistended. Bowel sounds present. Intertrigo  EXTREMITIES: No pedal edema, cyanosis, or clubbing.  NEUROLOGIC:  Sensation intact. Gait not checked.  Global weakness  noted PSYCHIATRIC: The patient is arousable and answers questions SKIN: No obvious rash, lesion, or ulcer.    LABORATORY PANEL:   CBC Recent Labs  Lab 11/17/17 0551  WBC 2.9*  HGB 7.8*  HCT 23.1*  PLT 65*   ------------------------------------------------------------------------------------------------------------------  Chemistries  Recent Labs  Lab 11/18/17 1427  NA 141  K 3.4*  CL 113*  CO2 19*  GLUCOSE 153*  BUN 38*  CREATININE 1.46*  CALCIUM 9.6  AST 121*  ALT 41  ALKPHOS 60  BILITOT 5.3*   ------------------------------------------------------------------------------------------------------------------  Cardiac Enzymes No results for input(s): TROPONINI in the last 168 hours. ------------------------------------------------------------------------------------------------------------------  RADIOLOGY:  No results found.  EKG:   Orders placed or performed during the hospital encounter of 10/29/2017  . ED EKG  . ED EKG  . EKG 12-Lead  . EKG 12-Lead  . EKG    ASSESSMENT AND PLAN:   Brandy Cooper  is a 42 y.o. female with a known history of anxiety and depression, status post laparoscopic gastric sleeve resection, dysfunctional uterine bleeding presents to hospital secondary to a fall.  #. 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 -Patient was counseled to stop drinking alcohol and she will be benefited with outpatient alcohol Anonymous -continue lactulose and xifaxan -patient is alert and tolerating medications by mouth this afternoon as reported by the RN - ammonia 110 -GI following -IV albumin, vit K - EGD as outpt, okay to discharge patient from GI standpoint, discussed with Dr. Tobi Bastos - BP is low to add any lasix or aldactone -Outpatient follow-up with gastroenterology as recommended for possible biopsy to know the staging of alcoholic liver cirrhosis  2.  Acute renal failure-   probably hepatorenal syndrome -Renal ultrasound-normal -Renal function is getting worse creatinine 1.3-2.59-2.41 --1.46 Monitor intake and output- Nephrology following - could be hepatorenal, with new diagnosis of cirrhosis -continue  aldactone once BP improves -Hold metformin and other nephrotoxins  3.  Alcoholic hepatitis and IgM CMV positive CMV PCR sent ID recommended steroids for alcoholic hepatitis.  Patient is started on prednisolone 40 mg once daily will continue for 28 days and taper and a course of 2 weeks Calculate [name]'s score on day 7 to decide if she is responding to steroids per GI   4.  Depression/anxiety/subs abuse-appreciate psych input - continue the Wellbutrin & Xanax.     5. Bacterial vaginosis/intertrigo- flagyl 500mg  bid for 7 days course completed Clinically improving Nystatin powder  Out of bed as tolerated  6.  Dysfunctional uterine bleeding-going on for a few months, had a left ovarian cyst based on pelvic ultrasound 2 months ago. -Missed recent follow-up visits and increased bleeding with worsening anemia. -Appreciate OB/GYN consult-they have recommended outpatient follow-up since no active bleeding at this time  7.  Hematuria after mechanical trauma and thrombocytopenia clearing up with bladder irrigation  Check CBC in a.m.  8.  Failure to thrive-seen by palliative care Family meeting on Thursday at 4 PM with the dad, aunt Patient's auntSyvil - [phone], call placed to give an update  she would like to talk to GI, notified GI   DVT prophylaxis-teds and SCDs.  Hold off on heparin products due to bleeding and thrombocytopenia    Physical therapy is recommending home health PT   All the records are reviewed and case discussed with Care Management/Social Workerr. Management plans discussed with the patient, mom at bedside.  Verbalized understanding of the plan CODE STATUS: Full Code  TOTAL TIME TAKING CARE OF THIS PATIENT: 28  minutes.    POSSIBLE D/C IN 1-2 DAYS, DEPENDING ON CLINICAL CONDITION.   Brandy Cooper M.D on 11/18/2017 at 4:03 PM  Between 7am to 6pm - Pager - [phone]  After 6pm go to [email or web address] - password [email or web address]  [facility]  Office  [phone]  CC: Primary care physician; Dortha KernBliss, Laura K, MD

## 2017-11-19 LAB — COMPREHENSIVE METABOLIC PANEL
ALT: 43 U/L (ref 14–54)
AST: 109 U/L — ABNORMAL HIGH (ref 15–41)
Albumin: 4.7 g/dL (ref 3.5–5.0)
Alkaline Phosphatase: 55 U/L (ref 38–126)
Anion gap: 10 (ref 5–15)
BUN: 40 mg/dL — ABNORMAL HIGH (ref 6–20)
CHLORIDE: 113 mmol/L — AB (ref 101–111)
CO2: 19 mmol/L — ABNORMAL LOW (ref 22–32)
Calcium: 9.6 mg/dL (ref 8.9–10.3)
Creatinine, Ser: 1.29 mg/dL — ABNORMAL HIGH (ref 0.44–1.00)
GFR calc non Af Amer: 51 mL/min — ABNORMAL LOW (ref >60)
GFR, EST AFRICAN AMERICAN: 59 mL/min — AB (ref >60)
Glucose, Bld: 139 mg/dL — ABNORMAL HIGH (ref 65–99)
Potassium: 3.7 mmol/L (ref 3.5–5.1)
Sodium: 142 mmol/L (ref 135–145)
Total Bilirubin: 5.4 mg/dL — ABNORMAL HIGH (ref 0.3–1.2)
Total Protein: 7.3 g/dL (ref 6.5–8.1)

## 2017-11-19 LAB — AMMONIA: AMMONIA: 123 umol/L — AB (ref 9–35)

## 2017-11-19 LAB — PROTIME-INR
INR: 2.6
PROTHROMBIN TIME: 27.6 s — AB (ref 11.4–15.2)

## 2017-11-19 MED ORDER — ADULT MULTIVITAMIN W/MINERALS CH
1.0000 | ORAL_TABLET | Freq: Every day | ORAL | Status: DC
  Administered 2017-11-20 – 2017-11-21 (×3): 1 via ORAL
  Filled 2017-11-19 (×2): qty 1

## 2017-11-19 MED ORDER — OLANZAPINE 5 MG PO TBDP
5.0000 mg | ORAL_TABLET | Freq: Every day | ORAL | Status: DC
Start: 2017-11-19 — End: 2017-11-22
  Administered 2017-11-19 – 2017-11-21 (×3): 5 mg via ORAL
  Filled 2017-11-19 (×4): qty 1

## 2017-11-19 NOTE — Progress Notes (Signed)
11/19/2017 11:24 AM  Pt lethargic today, falling asleep mi conversation though responsive to voice and touch.  Pt refusing or unable to take meds at this time.  Will hold for now and attempt to administer when pt is more awake.  Bradly Chrisougherty, Noell Lorensen E, RN

## 2017-11-19 NOTE — Clinical Social Work Note (Signed)
CSW and case manager met with patient's father and aunt to discuss discharge planning.  Patient has been deemed incompetant to make her own decisions.  Patient is unable to return back home, patient's family do not want let patient stay with them.  Patient's father and aunt are going to speak with patient's mother to discuss if she is willing to help with decision making and will update CSW and case manager tomorrow.  CSW has notified Surveyor, quantity of social work of patient's situation and he suggested CSW try to find placement for patient at either [facility] or [facility] under difficult to place.  CSW to continue to follow patient's progress throughout discharge planning.  Gullion Broom. Norval Morton, MSW, [place]  11/19/2017 8:41 PM

## 2017-11-19 NOTE — Progress Notes (Signed)
Physical Therapy Treatment Patient Details Name: Brandy Cooper MRN: [id number] DOB: July 23, 1976 Today's Date: 11/19/2017    History of Present Illness presented to ER secondary to vaginal bleeding, dizziness, lightheadedness and fall; admitted wiht acute renal failure, dysfunctional uterine bleeding (non-acute) and AMS (likely related to elevated ammonia, hepatic encephalopathy).    PT Comments    Pt agreeable to PT; no voiced complaints. Pt agreeable to ambulating with rolling walker this session. Improved ambulation quality regarding step through pattern/stride, steadiness and cadence/fluidity. Pt notes feeling more steady. Mild confusion noted during ambulation with pt conversation (feels she has known therapist for years, and notes difficulty finding words when striking conversation with another staff member). Post ambulation back in room, pt wishes to use commode to void (despite catheter; pt requests for her own personal reasons). Min guard to commode and supervision for hand hygiene. Pt reports feeling better with use of commode. Pt returned to chair and set up for dinner. Continue PT to progress quality/safety of ambulation, balance to improve all functional mobility.   Follow Up Recommendations  Home health PT     Equipment Recommendations       Recommendations for Other Services       Precautions / Restrictions Precautions Precautions: Fall Restrictions Weight Bearing Restrictions: No    Mobility  Bed Mobility               General bed mobility comments: Up in chair  Transfers Overall transfer level: Needs assistance Equipment used: Rolling walker (2 wheeled) Transfers: Sit to/from Stand Sit to Stand: Supervision;Min guard         General transfer comment: cues for hand placement  Ambulation/Gait Ambulation/Gait assistance: Min guard Ambulation Distance (Feet): 200 Feet Assistive device: Rolling walker (2 wheeled) Gait Pattern/deviations: Step-through  pattern;Drifts right/left;Wide base of support;Decreased stride length(mildly improved BOS; however, with use of rw)   Gait velocity interpretation: <1.8 ft/sec, indicate of risk for recurrent falls General Gait Details: Overall improved steadiness and step through gait pattern with rw.    Stairs             Wheelchair Mobility    Modified Rankin (Stroke Patients Only)       Balance Overall balance assessment: Needs assistance Sitting-balance support: No upper extremity supported;Feet supported Sitting balance-Leahy Scale: Good     Standing balance support: Bilateral upper extremity supported Standing balance-Leahy Scale: Fair                              Cognition Arousal/Alertness: Awake/alert Behavior During Therapy: WFL for tasks assessed/performed Overall Cognitive Status: (Mild confusion demonstrated)                                 General Comments: Pt feels she has known therapist for years. During ambulation pt starts conversation with staff member feeling they have "finally graduated"; pt continues to struggle with conversation then states she is "trying to find the words"       Exercises Other Exercises Other Exercises: Pt wishes to sit on commode despite catheter (pt states for her own personal self) Believe it made pt feel for lack of a better term, like her normal self.   Other Exercises: Supervision with stand sidestepping along counter and hand hygience.    General Comments        Pertinent Vitals/Pain Pain Assessment: No/denies pain  Home Living                      Prior Function            PT Goals (current goals can now be found in the care plan section) Progress towards PT goals: Progressing toward goals    Frequency    Min 2X/week      PT Plan Current plan remains appropriate    Co-evaluation              AM-PAC PT "6 Clicks" Daily Activity  Outcome Measure  Difficulty turning  over in bed (including adjusting bedclothes, sheets and blankets)?: None Difficulty moving from lying on back to sitting on the side of the bed? : A Little Difficulty sitting down on and standing up from a chair with arms (e.g., wheelchair, bedside commode, etc,.)?: A Little Help needed moving to and from a bed to chair (including a wheelchair)?: A Little Help needed walking in hospital room?: A Little Help needed climbing 3-5 steps with a railing? : A Lot 6 Click Score: 18    End of Session   Activity Tolerance: Patient tolerated treatment well Patient left: with chair alarm set;in chair;Other (comment)(feet dependent with tray table in front; ready to eat)   PT Visit Diagnosis: Difficulty in walking, not elsewhere classified (R26.2);Muscle weakness (generalized) (M62.81)     Time: 1701-1720 PT Time Calculation (min) (ACUTE ONLY): 19 min  Charges:  $Gait Training: 8-22 mins                    G Codes:        Scot DockHeidi E Barnes, PTA 11/19/2017, 5:38 PM

## 2017-11-19 NOTE — Progress Notes (Addendum)
Nutrition Follow Up Note   DOCUMENTATION CODES:   Morbid obesity  INTERVENTION:   Continue Ensure Enlive po BID, each supplement provides 350 kcal and 20 grams of protein.  Continue Magic cup TID with meals, each supplement provides 290 kcal and 9 grams of protein.  Vitamin/mineral labs pending (vitamin B1, vitamin A, vitamin E, vitamin K, vitamin D, zinc, copper).  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   -Per review of chart patient actually had a laparoscopic sleeve gastrectomy on 04/08/2011 and had some weight regain. 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   Pt now with psychosis and delusions; sitter at bedside. Psychiatry following. Appetite improved slightly; pt eating ~50% of meals and drinking some Ensure. Per chart, pt is weight stable. Given pt's history of duodenal switch, poor appetite,  etoh abuse and current AMS, RD concerned about nutrient deficiencies. Spoke with MD, will plan to check vitamin B1, vitamin A, vitamin E, vitamin K, vitamin D, zinc, and copper labs. Pt already noted to have low folic acid and ceruloplasmin. Continue supplements and vitamins. RD will follow up with nutrient recommendations pending lab results.   Medications reviewed and include: folic acid, I-vite, lactulose, vit k, miralax, prednisone, thiamine   Labs reviewed: BUN  40(H), creat 1.29(H), ammonia 123(H) Folic acid 2.5(L)- 4/12 ceruloplasmin 11.2(L)- 4/17 B12- 3810(H)- 4/12 Wbc- 2.9(L), Hgb 7.8(L), HCT 23.1(L) Diet Order:  Diet 2 gram sodium Room service appropriate? Yes; Fluid consistency: Thin  EDUCATION NEEDS:   Education needs have been addressed  Skin:  Skin Assessment: Skin Integrity Issues:(non pressure injury to right groin; MSAD to abdominal folds)  Last BM:  4/24- type 6  Height:   Ht Readings from Last 1 Encounters:  Jul 03, 2018 5\' 6"  (1.676 m)    Weight:   Wt Readings from Last 1 Encounters:  Jul 03, 2018 255 lb 11.7 oz (116 kg)    Ideal Body Weight:  59.1 kg  BMI:  Body mass index is 41.28 kg/m.  Estimated Nutritional Needs:   Kcal:  2030-2400 (MSJ x 1.1-1.3)  Protein:  75-90 grams (1.3-1.5 grams/kg IBW)  Fluid:  1.8-2 L/day (30-35 mL/kg IBW)  Betsey Holidayasey Makaiya Geerdes MS, RD, LDN Pager #- [phone] After Hours Pager: [phone]

## 2017-11-19 NOTE — Care Management (Signed)
Psych deemed patient incompetent.  RNCM and CSW met with patient's aunt Cybil, and father.  They have meet with Palliative and have been update by the medical MD. At this time they are not able to decide if they want to take on the responsibility of HPOA.  They are going to discuss it over night and also reach out to the patient's mother.  Family is to notify staff tomorrow on their decision.   At this time patient can not return home, and all family member have stated that the patient can not live with them at discharge.  CSW is working with leadership to determine placement

## 2017-11-19 NOTE — Progress Notes (Addendum)
Daily Progress Note   Patient Name: Brandy Cooper       Date: 11/19/2017 DOB: 05/29/1976  Age: 42 y.o. MRN#: [id number] Attending Physician: Ramonita Lab, MD Primary Care Physician: Dortha Kern, MD Admit Date: 11/12/2017  Reason for Consultation/Follow-up: Establishing goals of care  Subjective: Patient resting in bed. She has been declared unable to make decisions per psychiatry with psychosis. Upon speaking with [name] she states "how are you, I guess you've graduated". I reflected the statement and she said "I guess you're a senior now". Father and aunt are present at bedside.  Patient is separated and per aunt and father who are present, the separation has been legally filed. Spoke with RM group to discuss legal next of kin, they state that without the documents, her husband is her next of kin. Her family does not know where the documents are or how to locate them besides speaking with her husband. Spoke with [place], the patient's husband. He states that he does not want to make decisions for her and would like her parents and aunt to make decisions for her. Phone was handed to the charge RN for witness. This renouncement of decision making was confirmed by charge Camera operator.   Family to talk about who if any will be her POA. CM and SW speaking to family. Family to call tomorrow with decision.    Length of Stay: 13  Current Medications: Scheduled Meds:  . ALPRAZolam  2 mg Oral QHS  . buPROPion  300 mg Oral Daily  . feeding supplement (ENSURE ENLIVE)  237 mL Oral BID BM  . folic acid  1 mg Oral Daily  . I-VITE PROTECT  1 tablet Oral Daily  . lactulose  20 g Oral BID  . lidocaine  1 patch Transdermal Q24H  . multivitamin with minerals  1  tablet Oral Daily  . neomycin-bacitracin-polymyxin   Topical TID  . nystatin   Topical TID  . OLANZapine zydis  10 mg Oral QHS  . OLANZapine zydis  5 mg Oral  Daily  . phytonadione  10 mg Oral Daily  . polyethylene glycol  17 g Oral BID  . predniSONE  40 mg Oral Daily  . rifaximin  550 mg Oral BID  . sodium chloride flush  3 mL Intravenous Q12H  . thiamine  100 mg Oral Daily    Continuous Infusions: . sodium chloride      PRN Meds: sodium chloride, alum & mag hydroxide-simeth, diazepam, OLANZapine, ondansetron **OR** ondansetron (ZOFRAN) IV, oxyCODONE-acetaminophen, sodium chloride flush  Physical Exam  Constitutional: No distress.  Pulmonary/Chest: Effort normal.  Neurological: She is alert.            Vital Signs: BP 108/69 (BP Location: Left Arm)   Pulse 95   Temp 97.7 F (36.5 C) (Oral)   Resp 20   Ht 5\' 6"  (1.676 m)   Wt 117 kg (257 lb 15 oz)   SpO2 95%   BMI 41.63 kg/m  SpO2: SpO2: 95 % O2 Device: O2 Device: Room Air O2 Flow Rate:    Intake/output summary:   Intake/Output Summary (Last 24 hours) at 11/19/2017 1701 Last data filed at 11/19/2017 1316 Gross per 24 hour  Intake 220 ml  Output 301 ml  Net -81 ml   LBM: Last BM Date: 11/18/17 Baseline Weight: Weight: 113.4 kg (250 lb) Most recent weight: Weight: 117 kg (257 lb 15 oz)       Palliative Assessment/Data:    Flowsheet Rows     Most Recent Value  Intake Tab  Referral Department  Hospitalist  Unit at Time of Referral  Med/Surg Unit  Palliative Care Primary Diagnosis  Nephrology  Date Notified  11/16/17  Palliative Care Type  New Palliative care  Reason for referral  Clarify Goals of Care  Date of Admission  2018-05-19  # of days IP prior to Palliative referral  10  Clinical Assessment  Psychosocial & Spiritual Assessment  Palliative Care Outcomes      Patient Active Problem List   Diagnosis Date Noted  . 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 Subacute delirium  11/11/2017  . ARF (acute renal failure) (HCC) 04/16/18  . Substance induced mood disorder (HCC) 04/04/2015  . Alcohol abuse 04/04/2015  . Benzodiazepine overdose 04/04/2015    Palliative Care Assessment & Plan   Code Status:    Code Status Orders  (From admission, onward)        Start     Ordered   2018-05-19 1426  Full code  Continuous     2018-05-19 1425    Code Status History    This patient has a current code status but no historical code status.       Care plan was discussed with CM,SW.  Thank you for allowing the Palliative Medicine Team to assist in the care of this patient.   Time In: 4:00 Time Out: 5:08 Total Time 68 min Prolonged Time Billed  yes       Greater than 50%  of this time was spent counseling and coordinating care related to the above assessment and plan.  Morton Stallrystal Mareta Chesnut, NP  Please contact Palliative Medicine Team phone at [phone] for questions and concerns.

## 2017-11-19 NOTE — Consult Note (Signed)
[facility] Face-to-Face Psychiatry Consult   Reason for Consult: Consult follow-up 42 year old woman with a history of alcohol abuse now with encephalopathy complicated by symptoms of psychosis.  Repeat examination requested specifically involving capacity Referring Physician: Gouru Patient Identification: Brandy Cooper MRN:  [id number] Principal Diagnosis: Steroid-induced psychosis, with delusions [facility]) Diagnosis:   Patient Active Problem List   Diagnosis Date Noted  . Steroid-induced psychosis, with delusions ([place]) [F19.950] 11/17/2017  . Subacute delirium [F05] 11/11/2017  . ARF (acute renal failure) ([place]) [N17.9] 10/27/2017  . Substance induced mood disorder ([place]) [F19.94] 04/04/2015  . Alcohol abuse [F10.10] 04/04/2015  . Benzodiazepine overdose [T42.4X1A] 04/04/2015    Total Time spent with patient: 30 minutes  Subjective:   Brandy Cooper is a 42 y.o. female patient admitted with "get out of here, I do not like you!".  HPI: I was asked to come by and reassess the patient particularly about the idea of capacity.  Came by to round on patient this afternoon.  Her father and aunt were present as well.  As soon as I walked in the room and said hello the patient shouted at me that she wanted me to get out of the room and that she did not like me.  I asked her to please reconsider as I thought I could be of some help to her.  She refused to speak to me.  I would mention that this is particularly odd since the last time we talked we had a rather pleasant conversation.  Patient is reported to have still been intermittently confused and agitated at times.  Past Psychiatric History: History of alcohol abuse now with cirrhosis with intermittent delirium.  Most recently developed paranoid and hallucinatory psychotic symptoms possibly as part of the encephalopathy possibly also contributed to by steroids.  Risk to Self: Is patient at risk for suicide?: No Risk to Others:   Prior Inpatient Therapy:     Prior Outpatient Therapy:    Past Medical History:  Past Medical History:  Diagnosis Date  . Anxiety   . Bipolar affect, depressed ([place])   . Depression     Past Surgical History:  Procedure Laterality Date  . CHOLECYSTECTOMY    . LAPAROSCOPIC GASTRIC SLEEVE RESECTION    . TONSILLECTOMY     Family History:  Family History  Family history unknown: Yes   Family Psychiatric  History: None Social History:  Social History   Substance and Sexual Activity  Alcohol Use No     Social History   Substance and Sexual Activity  Drug Use No    Social History   Socioeconomic History  . Marital status: Legally Separated    Spouse name: Not on file  . Number of children: Not on file  . Years of education: Not on file  . Highest education level: Not on file  Occupational History  . Not on file  Social Needs  . Financial resource strain: Not on file  . Food insecurity:    Worry: Not on file    Inability: Not on file  . Transportation needs:    Medical: Not on file    Non-medical: Not on file  Tobacco Use  . Smoking status: Former Smoker    Types: Cigarettes  . Smokeless tobacco: Never Used  . Tobacco comment: quit since 2 months  Substance and Sexual Activity  . Alcohol use: No  . Drug use: No  . Sexual activity: Not on file  Lifestyle  . Physical activity:  Days per week: Not on file    Minutes per session: Not on file  . Stress: Not on file  Relationships  . Social connections:    Talks on phone: Not on file    Gets together: Not on file    Attends religious service: Not on file    Active member of club or organization: Not on file    Attends meetings of clubs or organizations: Not on file    Relationship status: Not on file  Other Topics Concern  . Not on file  Social History Narrative   Lives by herself, unsteady gait, ambulates without any assisted device   Additional Social History:    Allergies:   Allergies  Allergen Reactions  . Celebrex  [Celecoxib] Hives  . Morphine And Related Itching    Labs:  Results for orders placed or performed during the hospital encounter of 11/24/2017 (from the past 48 hour(s))  Ammonia     Status: Abnormal   Collection Time: 11/18/17  2:20 PM  Result Value Ref Range   Ammonia 110 (H) 9 - 35 umol/L    Comment: Performed at [facility], [address] [place], [place] [address]  Protime-INR     Status: Abnormal   Collection Time: 11/18/17  2:27 PM  Result Value Ref Range   Prothrombin Time 26.1 (H) 11.4 - 15.2 seconds   INR 2.42     Comment: Performed at [facility], [address] [place], [place] [address]  Comprehensive metabolic panel     Status: Abnormal   Collection Time: 11/18/17  2:27 PM  Result Value Ref Range   Sodium 141 135 - 145 mmol/L   Potassium 3.4 (L) 3.5 - 5.1 mmol/L   Chloride 113 (H) 101 - 111 mmol/L   CO2 19 (L) 22 - 32 mmol/L   Glucose, Bld 153 (H) 65 - 99 mg/dL   BUN 38 (H) 6 - 20 mg/dL   Creatinine, Ser 1.46 (H) 0.44 - 1.00 mg/dL   Calcium 9.6 8.9 - 10.3 mg/dL   Total Protein 7.2 6.5 - 8.1 g/dL   Albumin 4.3 3.5 - 5.0 g/dL   AST 121 (H) 15 - 41 U/L   ALT 41 14 - 54 U/L   Alkaline Phosphatase 60 38 - 126 U/L   Total Bilirubin 5.3 (H) 0.3 - 1.2 mg/dL   GFR calc non Af Amer 44 (L) >60 mL/min   GFR calc Af Amer 51 (L) >60 mL/min    Comment: (NOTE) The eGFR has been calculated using the CKD EPI equation. This calculation has not been validated in all clinical situations. eGFR's persistently <60 mL/min signify possible Chronic Kidney Disease.    Anion gap 9 5 - 15    Comment: Performed at [facility], [address] [place], [place] [address]  Protime-INR     Status: Abnormal   Collection Time: 11/19/17  5:04 AM  Result Value Ref Range   Prothrombin Time 27.6 (H) 11.4 - 15.2 seconds   INR 2.60     Comment: Performed at [facility], [address] [place],  [address]  Comprehensive metabolic panel      Status: Abnormal   Collection Time: 11/19/17  5:04 AM  Result Value Ref Range   Sodium 142 135 - 145 mmol/L   Potassium 3.7 3.5 - 5.1 mmol/L   Chloride 113 (H) 101 - 111 mmol/L   CO2 19 (L) 22 - 32 mmol/L   Glucose, Bld 139 (  H) 65 - 99 mg/dL   BUN 40 (H) 6 - 20 mg/dL   Creatinine, Ser 1.29 (H) 0.44 - 1.00 mg/dL   Calcium 9.6 8.9 - 10.3 mg/dL   Total Protein 7.3 6.5 - 8.1 g/dL   Albumin 4.7 3.5 - 5.0 g/dL   AST 109 (H) 15 - 41 U/L   ALT 43 14 - 54 U/L   Alkaline Phosphatase 55 38 - 126 U/L   Total Bilirubin 5.4 (H) 0.3 - 1.2 mg/dL   GFR calc non Af Amer 51 (L) >60 mL/min   GFR calc Af Amer 59 (L) >60 mL/min    Comment: (NOTE) The eGFR has been calculated using the CKD EPI equation. This calculation has not been validated in all clinical situations. eGFR's persistently <60 mL/min signify possible Chronic Kidney Disease.    Anion gap 10 5 - 15    Comment: Performed at [facility], [address] [place], [place] [address]  Ammonia     Status: Abnormal   Collection Time: 11/19/17  5:04 AM  Result Value Ref Range   Ammonia 123 (H) 9 - 35 umol/L    Comment: Performed at [facility], [address] [place], [place] [address]    Current Facility-Administered Medications  Medication Dose Route Frequency Provider Last Rate Last Dose  . 0.9 %  sodium chloride infusion  250 mL Intravenous PRN Gladstone Lighter, MD      . ALPRAZolam Duanne Moron) tablet 2 mg  2 mg Oral QHS Gladstone Lighter, MD   2 mg at 11/18/17 2142  . alum & mag hydroxide-simeth (MAALOX/MYLANTA) 200-200-20 MG/5ML suspension 30 mL  30 mL Oral Q6H PRN Gladstone Lighter, MD   30 mL at 11/08/17 2247  . buPROPion (WELLBUTRIN XL) 24 hr tablet 300 mg  300 mg Oral Daily Gladstone Lighter, MD   300 mg at 11/19/17 1131  . diazepam (VALIUM) tablet 5 mg  5 mg Oral Q8H PRN Gladstone Lighter, MD   5 mg at 11/14/17 0113  . feeding supplement (ENSURE ENLIVE) (ENSURE ENLIVE) liquid 237 mL  237 mL Oral BID BM  Lin Landsman, MD   Stopped at 11/19/17 1132  . folic acid (FOLVITE) tablet 1 mg  1 mg Oral Daily Gladstone Lighter, MD   1 mg at 11/19/17 1132  . I-VITE PROTECT TABS 1 tablet  1 tablet Oral Daily Gouru, Aruna, MD   1 tablet at 11/19/17 1132  . lactulose (CHRONULAC) 10 GM/15ML solution 20 g  20 g Oral BID Gouru, Aruna, MD   20 g at 11/19/17 1132  . lidocaine (LIDODERM) 5 % 1 patch  1 patch Transdermal Q24H Gladstone Lighter, MD   1 patch at 11/19/17 1132  . multivitamin with minerals tablet 1 tablet  1 tablet Oral Daily Jonathon Bellows, MD   Stopped at 11/19/17 1211  . neomycin-bacitracin-polymyxin (NEOSPORIN) ointment   Topical TID Nicholes Mango, MD      . nystatin (MYCOSTATIN/NYSTOP) topical powder   Topical TID Gouru, Aruna, MD      . OLANZapine (ZYPREXA) injection 10 mg  10 mg Intramuscular Once PRN Vessie Olmsted T, MD      . OLANZapine zydis (ZYPREXA) disintegrating tablet 10 mg  10 mg Oral QHS Zhavia Cunanan, Madie Reno, MD   10 mg at 11/18/17 2141  . OLANZapine zydis (ZYPREXA) disintegrating tablet 5 mg  5 mg Oral Daily Taniaya Rudder T, MD      . ondansetron (ZOFRAN) tablet 4 mg  4 mg Oral Q6H  PRN Gladstone Lighter, MD       Or  . ondansetron [facility]) injection 4 mg  4 mg Intravenous Q6H PRN Gladstone Lighter, MD      . oxyCODONE-acetaminophen (PERCOCET/ROXICET) 5-325 MG per tablet 1 tablet  1 tablet Oral Q6H PRN Gladstone Lighter, MD   1 tablet at 11/19/17 0630  . phytonadione (VITAMIN K) tablet 10 mg  10 mg Oral Daily Lin Landsman, MD   10 mg at 11/19/17 1133  . polyethylene glycol (MIRALAX / GLYCOLAX) packet 17 g  17 g Oral BID Lin Landsman, MD   17 g at 11/19/17 1133  . predniSONE (DELTASONE) tablet 40 mg  40 mg Oral Daily Gouru, Aruna, MD   40 mg at 11/19/17 1133  . rifaximin (XIFAXAN) tablet 550 mg  550 mg Oral BID Gladstone Lighter, MD   550 mg at 11/19/17 1133  . sodium chloride flush (NS) 0.9 % injection 3 mL  3 mL Intravenous Q12H Gladstone Lighter, MD   3 mL at  11/19/17 1151  . sodium chloride flush (NS) 0.9 % injection 3 mL  3 mL Intravenous PRN Gladstone Lighter, MD      . thiamine (VITAMIN B-1) tablet 100 mg  100 mg Oral Daily Lin Landsman, MD   100 mg at 11/19/17 1133    Musculoskeletal: Strength & Muscle Tone: decreased and atrophy Gait & Station: unsteady Patient leans: N/A  Psychiatric Specialty Exam: Physical Exam  Nursing note and vitals reviewed. Constitutional: She appears well-developed and well-nourished.  HENT:  Head: Normocephalic and atraumatic.  Eyes: Pupils are equal, round, and reactive to light. Conjunctivae are normal.  Neck: Normal range of motion.  Cardiovascular: Regular rhythm and normal heart sounds.  Respiratory: Effort normal.  GI: She exhibits distension.  Neurological: She is alert. She displays tremor.  Skin: Skin is warm and dry.     Psychiatric: Her affect is angry and blunt. She is agitated. Thought content is paranoid. Cognition and memory are impaired. She expresses impulsivity. She is noncommunicative.    Review of Systems  Unable to perform ROS: Psychiatric disorder    Blood pressure 108/69, pulse 95, temperature 97.7 F (36.5 C), temperature source Oral, resp. rate 20, height '5\' 6"'  (1.676 m), weight 117 kg (257 lb 15 oz), SpO2 95 %.Body mass index is 41.63 kg/m.  General Appearance: Casual  Eye Contact:  Minimal  Speech:  Negative  Volume:  Decreased  Mood:  Irritable  Affect:  Inappropriate  Thought Process:  Disorganized  Orientation:  Negative  Thought Content:  Negative  Suicidal Thoughts:  No  Homicidal Thoughts:  No  Memory:  Negative  Judgement:  Negative  Insight:  Negative  Psychomotor Activity:  Decreased  Concentration:  Concentration: Negative  Recall:  Negative  Fund of Knowledge:  Negative  Language:  Negative  Akathisia:  Negative  Handed:  Right  AIMS (if indicated):     Assets:  Social Support  ADL's:  Impaired  Cognition:  Impaired,  Moderate  Sleep:         Treatment Plan Summary: Daily contact with patient to assess and evaluate symptoms and progress in treatment, Medication management and Plan 42 year old woman as described above.  Based on my interaction with her today and review of the chart the patient clearly seems to be worsening.  In her current condition it is clear that she has no capacity to make decisions about her own self-care.  She is not even capable of beginning an appropriate  conversation about her current condition.  Still showing a lot of signs of paranoia.  In her current mental condition the patient would be unable to take care of herself in a safe manner on her own.  Spoke with case management social work is working on trying to find some kind of options that can be available.  Meanwhile I have increased the olanzapine to a total of 15 mg a day.  Explained the situation to father and aunt.  Disposition: Patient does not meet criteria for psychiatric inpatient admission. Supportive therapy provided about ongoing stressors.  Alethia Berthold, MD 11/19/2017 4:47 PM

## 2017-11-19 NOTE — Progress Notes (Signed)
[employer] [facility] [place] [facility]   PATIENT NAME: Brandy Cooper    MR#:  [id number]  DATE OF BIRTH:  10/13/1975  SUBJECTIVE:  CHIEF COMPLAINT:   Chief Complaint  Patient presents with  . Vaginal Bleeding  Patient is ok, lethargic today , seen by psych and agitated , dad and aunt are here REVIEW OF SYSTEMS:  Limited review of system  cONSTITUTIONAL: No fever, fatigue  EYES: No blurred or double vision.  EARS, NOSE, AND THROAT: No tinnitus or ear pain.  RESPIRATORY: No cough, shortness of breath, wheezing or hemoptysis.  CARDIOVASCULAR: No chest pain, orthopnea, edema.  GASTROINTESTINAL: No nausea, vomiting SKIN: No rash or lesion.    DRUG ALLERGIES:   Allergies  Allergen Reactions  . Celebrex [Celecoxib] Hives  . Morphine And Related Itching    VITALS:  Blood pressure 108/69, pulse 95, temperature 97.7 F (36.5 C), temperature source Oral, resp. rate 20, height 5\' 6"  (1.676 m), weight 117 kg (257 lb 15 oz), SpO2 95 %.  PHYSICAL EXAMINATION:  Physical Exam  GENERAL:  42 y.o.-year-old patient lying in the bed with no acute distress.  EYES: Pupils equal, round, reactive to light and accommodation.  Has scleral icterus. Extraocular muscles intact. Icteric sclerae HEENT: Head atraumatic, normocephalic. Oropharynx and nasopharynx clear.  NECK:  Supple, no jugular venous distention. No thyroid enlargement, no tenderness.  LUNGS: Normal breath sounds bilaterally, no wheezing, rales,rhonchi or crepitation. No use of accessory muscles of respiration. Decreased bibasilar breath sounds CARDIOVASCULAR: S1, S2 normal. No murmurs, rubs, or gallops.  ABDOMEN: Soft, obese, nontender, nondistended. Bowel sounds present. Intertrigo  EXTREMITIES: No pedal edema, cyanosis, or clubbing.  NEUROLOGIC:  Sensation intact. Gait not checked.  Global weakness noted PSYCHIATRIC: The patient is arousable and answers questions SKIN: No obvious rash, lesion, or ulcer.     LABORATORY PANEL:   CBC Recent Labs  Lab 11/17/17 0551  WBC 2.9*  HGB 7.8*  HCT 23.1*  PLT 65*   ------------------------------------------------------------------------------------------------------------------  Chemistries  Recent Labs  Lab 11/19/17 0504  NA 142  K 3.7  CL 113*  CO2 19*  GLUCOSE 139*  BUN 40*  CREATININE 1.29*  CALCIUM 9.6  AST 109*  ALT 43  ALKPHOS 55  BILITOT 5.4*   ------------------------------------------------------------------------------------------------------------------  Cardiac Enzymes No results for input(s): TROPONINI in the last 168 hours. ------------------------------------------------------------------------------------------------------------------  RADIOLOGY:  No results found.  EKG:   Orders placed or performed during the hospital encounter of 11/03/2017  . ED EKG  . ED EKG  . EKG 12-Lead  . EKG 12-Lead  . EKG    ASSESSMENT AND PLAN:   Brandy Cooper  is a 42 y.o. female with a known history of anxiety and depression, status post laparoscopic gastric sleeve resection, dysfunctional uterine bleeding presents to hospital secondary to a fall.  #. 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 -Patient was counseled to stop drinking alcohol and she will be benefited with outpatient alcohol Anonymous -continue lactulose and xifaxan -patient is alert and tolerating medications by mouth this afternoon as reported by the RN - ammonia 123 -GI following -IV albumin, vit K - EGD as outpt, okay to discharge patient from GI standpoint, discussed with Dr. Tobi Bastos - BP is low to add any lasix or aldactone -Outpatient follow-up with gastroenterology as recommended for possible biopsy to know the staging of alcoholic liver cirrhosis  2.  Acute renal failure-  probably hepatorenal syndrome -Renal ultrasound-normal -Renal function  is getting worse creatinine 1.3-2.59-2.41  --1.46-1.29 Monitor intake and output- Nephrology following - could be hepatorenal, with new diagnosis of cirrhosis -continue  aldactone once BP improves -Hold metformin and other nephrotoxins  3.  Alcoholic hepatitis and IgM CMV positive CMV PCR sent ID recommended steroids for alcoholic hepatitis.  Patient is started on prednisolone 40 mg once daily will continue for 28 days and taper and a course of 2 weeks Calculate [name]'s score on day 7 to decide if she is responding to steroids per GI   4.  Depression/anxiety/subs abuse-appreciate psych input - continue the Wellbutrin & Xanax.     5. Bacterial vaginosis/intertrigo- flagyl 500mg  bid for 7 days course completed Clinically improving Nystatin powder  Out of bed as tolerated  6.  Dysfunctional uterine bleeding-going on for a few months, had a left ovarian cyst based on pelvic ultrasound 2 months ago. -Missed recent follow-up visits and increased bleeding with worsening anemia. -Appreciate OB/GYN consult-they have recommended outpatient follow-up since no active bleeding at this time  7.  Hematuria after mechanical trauma and thrombocytopenia clearing up with bladder irrigation  Check CBC in a.m.  8.  Failure to thrive-seen by palliative care Discussed with the family members dad and aunt today  Patient's auntSyvil - [phone] will d/w GI, dr.Ana will call aunt Family will d/w palliative care and CM   DVT prophylaxis-teds and SCDs.  Hold off on heparin products due to bleeding and thrombocytopenia    Physical therapy is recommending home health PT   All the records are reviewed and case discussed with Care Management/Social Workerr. Management plans discussed with the patient, mom at bedside.  Verbalized understanding of the plan CODE STATUS: Full Code  TOTAL TIME TAKING CARE OF THIS PATIENT: 28  minutes.   POSSIBLE D/C IN 1-2 DAYS, DEPENDING ON CLINICAL CONDITION.   Brandy Cooper M.D on 11/19/2017 at 4:34  PM  Between 7am to 6pm - Pager - [phone]  After 6pm go to [email or web address] - password [email or web address]  [facility]  Office  [phone]  CC: Primary care physician; Dortha KernBliss, Laura K, MD

## 2017-11-19 NOTE — Progress Notes (Signed)
[facility]  ROUNDING NOTE   Subjective:   Creatinine back to baseline  Objective:  Vital signs in last 24 hours:  Temp:  [97.7 F (36.5 C)-98.2 F (36.8 C)] 97.7 F (36.5 C) (04/25 0548) Pulse Rate:  [85-95] 95 (04/25 0548) Resp:  [16-20] 20 (04/25 0548) BP: (108-161)/(60-131) 108/69 (04/25 0548) SpO2:  [95 %-100 %] 95 % (04/25 0548)  Weight change:  Filed Weights   11/16/2017 0751 11/16/2017 1426  Weight: 113.4 kg (250 lb) 116 kg (255 lb 11.7 oz)    Intake/Output: I/O last 3 completed shifts: In: 1164 [P.O.:120; I.V.:694; Other:150; IV Piggyback:200] Out: 951 [Urine:950; Stool:1]   Intake/Output this shift:  No intake/output data recorded.  Physical Exam: General: NAD,   Head: Normocephalic, atraumatic. Moist oral mucosal membranes  Eyes: Anicteric, PERRL  Neck: Supple, trachea midline  Lungs:  Clear to auscultation  Heart: Regular rate and rhythm  Abdomen:  Soft, nontender,   Extremities: no peripheral edema.  Neurologic: Nonfocal, moving all four extremities  Skin: No lesions  Psych [place] affect    Basic Metabolic Panel: Recent Labs  Lab 11/15/17 0507 11/16/17 0517 11/17/17 0557 11/18/17 1427 11/19/17 0504  NA 139 137 136 141 142  K 3.7 3.4* 3.5 3.4* 3.7  CL 113* 109 110 113* 113*  CO2 20* 19* 19* 19* 19*  GLUCOSE 139* 123* 143* 153* 139*  BUN 35* 37* 39* 38* 40*  CREATININE 2.59* 2.41* 2.01* 1.46* 1.29*  CALCIUM 8.8* 9.3 9.1 9.6 9.6    Liver Function Tests: Recent Labs  Lab 11/15/17 0507 11/16/17 0517 11/17/17 0557 11/18/17 1427 11/19/17 0504  AST 109* 129* 104* 121* 109*  ALT 29 36 33 41 43  ALKPHOS 63 64 63 60 55  BILITOT 5.0* 4.8* 4.1* 5.3* 5.4*  PROT 6.0* 7.1 6.7 7.2 7.3  ALBUMIN 2.6* 3.8 3.9 4.3 4.7   No results for input(s): LIPASE, AMYLASE in the last 168 hours. Recent Labs  Lab 11/17/17 0557 11/18/17 1420 11/19/17 0504  AMMONIA 176* 110* 123*    CBC: Recent Labs  Lab 11/13/17 0530 11/14/17 0634  11/15/17 0507 11/17/17 0551  WBC 2.8* 5.5 3.5* 2.9*  NEUTROABS  --   --   --  2.3  HGB 9.2* 9.3* 8.4* 7.8*  HCT 26.4* 26.0* 24.7* 23.1*  MCV 108.1* 108.6* 108.8* 109.1*  PLT 74* 86* 78* 65*    Cardiac Enzymes: No results for input(s): CKTOTAL, CKMB, CKMBINDEX, TROPONINI in the last 168 hours.  BNP: Invalid input(s): POCBNP  CBG: No results for input(s): GLUCAP in the last 168 hours.  Microbiology: Results for orders placed or performed during the hospital encounter of 11/20/2017  Wet prep, genital     Status: Abnormal   Collection Time: 11/18/2017  8:31 AM  Result Value Ref Range Status   Yeast Wet Prep HPF POC NONE SEEN NONE SEEN Final   Trich, Wet Prep NONE SEEN NONE SEEN Final   Clue Cells Wet Prep HPF POC PRESENT (A) NONE SEEN Final   WBC, Wet Prep HPF POC MODERATE (A) NONE SEEN Final   Sperm NONE SEEN  Final    Comment: Performed at [facility], [address] [place], [place] [address]  Chlamydia/NGC rt PCR [facility] only)     Status: None   Collection Time: 11/15/2017  8:31 AM  Result Value Ref Range Status   Specimen source GC/Chlam CHLAMYDIA SPECIES  Final   Chlamydia Tr NOT DETECTED NOT DETECTED Final   N gonorrhoeae NOT DETECTED  NOT DETECTED Final    Comment: (NOTE) 100  This methodology has not been evaluated in pregnant women or in 200  patients with a history of hysterectomy. 300 400  This methodology will not be performed on patients less than 6514  years of age. Performed at [facility], [address] [place], [place] [address]     Coagulation Studies: Recent Labs    11/17/17 0557 11/18/17 1427 11/19/17 0504  LABPROT 24.8* 26.1* 27.6*  INR 2.26 2.42 2.60    Urinalysis: No results for input(s): COLORURINE, LABSPEC, PHURINE, GLUCOSEU, HGBUR, BILIRUBINUR, KETONESUR, PROTEINUR, UROBILINOGEN, NITRITE, LEUKOCYTESUR in the last 72 hours.  Invalid input(s): APPERANCEUR    Imaging: No results found.   Medications:   . sodium  chloride     . ALPRAZolam  2 mg Oral QHS  . buPROPion  300 mg Oral Daily  . feeding supplement (ENSURE ENLIVE)  237 mL Oral BID BM  . folic acid  1 mg Oral Daily  . I-VITE PROTECT  1 tablet Oral Daily  . lactulose  20 g Oral BID  . lidocaine  1 patch Transdermal Q24H  . neomycin-bacitracin-polymyxin   Topical TID  . nystatin   Topical TID  . OLANZapine zydis  10 mg Oral QHS  . phytonadione  10 mg Oral Daily  . polyethylene glycol  17 g Oral BID  . predniSONE  40 mg Oral Daily  . rifaximin  550 mg Oral BID  . sodium chloride flush  3 mL Intravenous Q12H  . thiamine  100 mg Oral Daily   sodium chloride, alum & mag hydroxide-simeth, diazepam, OLANZapine, ondansetron **OR** ondansetron (ZOFRAN) IV, oxyCODONE-acetaminophen, sodium chloride flush  Assessment/ Plan:  [name] is a 42 y.o. white female anxiety, bipolar disorder, depression, alcohol abuse, who was admitted to [facility] on 11/24/2017 for evaluation of vaginal bleeding as well as recent fall.   1.  Acute renal failure.  2.  Hepatic steatosis/Alcoholic hepatitis.  3.  Hepatic encephalopathy.  4. Anemia with kidney failure 5. Diabetes with renal manifestations 6. Hypertension  Plan:  - Encourage PO intake.  - Will sign off. Please call with questions.    LOS: 13 Brandy Cooper 4/25/201910:29 AM

## 2017-11-19 NOTE — Progress Notes (Signed)
Patient was sleeping. Chaplain offered silent prayer and presence.

## 2017-11-20 DIAGNOSIS — K7460 Unspecified cirrhosis of liver: Secondary | ICD-10-CM

## 2017-11-20 LAB — PROTIME-INR
INR: 2.9
Prothrombin Time: 30.1 seconds — ABNORMAL HIGH (ref 11.4–15.2)

## 2017-11-20 LAB — COMPREHENSIVE METABOLIC PANEL
ALT: 42 U/L (ref 14–54)
ANION GAP: 7 (ref 5–15)
AST: 90 U/L — ABNORMAL HIGH (ref 15–41)
Albumin: 4.3 g/dL (ref 3.5–5.0)
Alkaline Phosphatase: 51 U/L (ref 38–126)
BUN: 39 mg/dL — ABNORMAL HIGH (ref 6–20)
CHLORIDE: 114 mmol/L — AB (ref 101–111)
CO2: 23 mmol/L (ref 22–32)
Calcium: 9.7 mg/dL (ref 8.9–10.3)
Creatinine, Ser: 1.26 mg/dL — ABNORMAL HIGH (ref 0.44–1.00)
GFR calc non Af Amer: 52 mL/min — ABNORMAL LOW (ref >60)
Glucose, Bld: 157 mg/dL — ABNORMAL HIGH (ref 65–99)
Potassium: 3.6 mmol/L (ref 3.5–5.1)
SODIUM: 144 mmol/L (ref 135–145)
Total Bilirubin: 5.4 mg/dL — ABNORMAL HIGH (ref 0.3–1.2)
Total Protein: 6.9 g/dL (ref 6.5–8.1)

## 2017-11-20 LAB — ZINC: Zinc: 54 ug/dL — ABNORMAL LOW (ref 56–134)

## 2017-11-20 LAB — AMMONIA: Ammonia: 59 umol/L — ABNORMAL HIGH (ref 9–35)

## 2017-11-20 LAB — COPPER, SERUM: COPPER: 36 ug/dL — AB (ref 72–166)

## 2017-11-20 LAB — VITAMIN D 25 HYDROXY (VIT D DEFICIENCY, FRACTURES): VIT D 25 HYDROXY: 20.7 ng/mL — AB (ref 30.0–100.0)

## 2017-11-20 MED ORDER — SODIUM CHLORIDE 0.9 % IV SOLN
INTRAVENOUS | Status: DC
  Administered 2017-11-21: 12:00:00 via INTRAVENOUS
  Filled 2017-11-20 (×2): qty 5

## 2017-11-20 MED ORDER — TRACE MINERALS CR-CU-MN-SE-ZN 10-1000-500-60 MCG/ML IV SOLN
INTRAVENOUS | Status: AC
  Administered 2017-11-20: 13:00:00 via INTRAVENOUS
  Filled 2017-11-20: qty 2

## 2017-11-20 MED ORDER — OCUVITE-LUTEIN PO CAPS
1.0000 | ORAL_CAPSULE | Freq: Two times a day (BID) | ORAL | Status: DC
  Administered 2017-11-21: 1 via ORAL
  Filled 2017-11-20 (×5): qty 1

## 2017-11-20 MED ORDER — VITAMIN D 1000 UNITS PO TABS
3000.0000 [IU] | ORAL_TABLET | Freq: Every day | ORAL | Status: DC
  Administered 2017-11-20 – 2017-11-21 (×2): 3000 [IU] via ORAL
  Filled 2017-11-20 (×2): qty 3

## 2017-11-20 NOTE — Progress Notes (Addendum)
Daily Progress Note   Patient Name: Brandy Cooper       Date: 11/20/2017 DOB: 1976-04-23  Age: 42 y.o. MRN#: [id number] Attending Physician: Ramonita Lab, MD Primary Care Physician: Dortha Kern, MD Admit Date: 10/30/2017  Reason for Consultation/Follow-up: Establishing goals of care  Subjective: Patient resting in bed. Mother at bedside. Patient is able to tell me she is in th hospital, that she is here because of her drinking and liver problems, and able to tell me the president is Trump. She cannot tell me the date. She tells her mother she has dropped her water looking to the floor as the water container sat on th bedside table. She is confused with other statements.   Her mother states she will be the decision maker for her daughter as she is her biological mother, but her father is her adoptive father; she states her biological father died, and Clide Cliff adopted her when she was 42 years old.   We discussed her diagnosis, prognosis, GOC, EOL wishes disposition and options.  A detailed discussion was had today regarding advanced directives.  Concepts specific to code status, artifical feeding and hydration,  IV antibiotics and rehospitalization was had.  The difference between an aggressive medical intervention path and a hospice comfort care path for this patient at this time was had.  Values and goals of care important to patient and family were attempted to be elicited.  Her mother encourages her to voice her wishes; she understand her daughter is confused. [name] asked to speak privately to her mother. Upon return, [name] tells me she would not want a feeding tube, dialysis, a ventilator for distress, or chest compressions, shocks, or a  breathing tube if her heart and lungs stopped. She would like to treat the treatable.   MOST form completed for DNR, limited additional interventions, abx ok, IVF ok, no feeding tube. Signed by patient and mother both.   Length of Stay: 14  Current Medications: Scheduled Meds:  . ALPRAZolam  2 mg Oral QHS  .  buPROPion  300 mg Oral Daily  . feeding supplement (ENSURE ENLIVE)  237 mL Oral BID BM  . folic acid  1 mg Oral Daily  . lactulose  20 g Oral BID  . lidocaine  1 patch Transdermal Q24H  . multivitamin with minerals  1 tablet Oral Daily  . multivitamin-lutein  1 capsule Oral BID  . neomycin-bacitracin-polymyxin   Topical TID  . nystatin   Topical TID  . OLANZapine zydis  10 mg Oral QHS  . OLANZapine zydis  5 mg Oral Daily  . phytonadione  10 mg Oral Daily  . polyethylene glycol  17 g Oral BID  . predniSONE  40 mg Oral Daily  . rifaximin  550 mg Oral BID  . sodium chloride flush  3 mL Intravenous Q12H  . thiamine  100 mg Oral Daily    Continuous Infusions: . sodium chloride    . [START ON 11/21/2017] small volume/piggyback builder    . small volume/piggyback builder 10.4 mL/hr at 11/20/17 1232    PRN Meds: sodium chloride, alum & mag hydroxide-simeth, diazepam, OLANZapine, ondansetron **OR** ondansetron (ZOFRAN) IV, oxyCODONE-acetaminophen, sodium chloride flush  Physical Exam  Constitutional: No distress.  Pulmonary/Chest: Effort normal.  Neurological: She is alert.  Confused            Vital Signs: BP 103/61 (BP Location: Right Wrist)   Pulse 64   Temp (!) 97.4 F (36.3 C) (Oral)   Resp 16   Ht 5\' 6"  (1.676 m)   Wt 117 kg (257 lb 15 oz)   SpO2 96%   BMI 41.63 kg/m  SpO2: SpO2: 96 % O2 Device: O2 Device: Room Air O2 Flow Rate:    Intake/output summary:   Intake/Output Summary (Last 24 hours) at 11/20/2017 1253 Last data filed at 11/19/2017 2147 Gross per 24 hour  Intake 483 ml  Output 500 ml  Net -17 ml   LBM: Last BM Date: 11/18/17 Baseline Weight:  Weight: 113.4 kg (250 lb) Most recent weight: Weight: 117 kg (257 lb 15 oz)       Palliative Assessment/Data: 50%    Flowsheet Rows     Most Recent Value  Intake Tab  Referral Department  Hospitalist  Unit at Time of Referral  Med/Surg Unit  Palliative Care Primary Diagnosis  Nephrology  Date Notified  11/16/17  Palliative Care Type  New Palliative care  Reason for referral  Clarify Goals of Care  Date of Admission  27-Nov-2017  # of days IP prior to Palliative referral  10  Clinical Assessment  Psychosocial & Spiritual Assessment  Palliative Care Outcomes      Patient Active Problem List   Diagnosis Date Noted  . 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 Subacute delirium 11/11/2017  . ARF (acute renal failure) (HCC) Nov 27, 2017  . Substance induced mood disorder (HCC) 04/04/2015  . Alcohol abuse 04/04/2015  . Benzodiazepine overdose 04/04/2015    Palliative Care Assessment & Plan   Code Status:    Code Status Orders  (From admission, onward)        Start     Ordered   11-27-17 1426  Full code  Continuous     11/27/2017 1425    Code Status History    This patient has a current code status but no historical code status.       Care plan was discussed with Gouru,SW.  Thank you for allowing the Palliative Medicine Team to assist in the care of this patient.  Time In: 11:20 Time Out: 12:30 Total Time 70min Prolonged Time Billed  yes       Greater than 50%  of this time was spent counseling and coordinating care related to the above assessment and plan.  Morton Stallrystal Janeliz Prestwood, NP  Please contact Palliative Medicine Team phone at [phone] for questions and concerns.

## 2017-11-20 NOTE — Progress Notes (Signed)
Wyline Mood , MD [address], [place], [place], [address] [address], [address], [place], [place], [address] Phone: [phone]  Fax: [phone]   Brandy Cooper is being followed for Alcoholic hepatitis    Subjective: Feels well having hr lunch    Objective: Vital signs in last 24 hours: Vitals:   11/19/17 0548 11/19/17 1126 11/19/17 2014 11/20/17 0628  BP: 108/69  (!) 100/51 103/61  Pulse: 95  76 64  Resp: 20  20 16   Temp: 97.7 F (36.5 C)  (!) 97.4 F (36.3 C)   TempSrc: Oral  Oral   SpO2: 95%  96% 96%  Weight:  257 lb 15 oz (117 kg)    Height:       Weight change:   Intake/Output Summary (Last 24 hours) at 11/20/2017 1054 Last data filed at 11/19/2017 2147 Gross per 24 hour  Intake 483 ml  Output 500 ml  Net -17 ml     Exam: Heart:: Regular rate and rhythm, S1S2 present or without murmur or extra heart sounds Lungs: normal, clear to auscultation and clear to auscultation and percussion Abdomen: soft, nontender, normal bowel sounds   Lab Results: @LABTEST2 @ Micro Results: No results found for this or any previous visit (from the past 240 hour(s)). Studies/Results: No results found. Medications: I have reviewed the patient's current medications. Scheduled Meds: . ALPRAZolam  2 mg Oral QHS  . buPROPion  300 mg Oral Daily  . feeding supplement (ENSURE ENLIVE)  237 mL Oral BID BM  . folic acid  1 mg Oral Daily  . lactulose  20 g Oral BID  . lidocaine  1 patch Transdermal Q24H  . multivitamin with minerals  1 tablet Oral Daily  . multivitamin-lutein  1 capsule Oral BID  . neomycin-bacitracin-polymyxin   Topical TID  . nystatin   Topical TID  . OLANZapine zydis  10 mg Oral QHS  . OLANZapine zydis  5 mg Oral Daily  . phytonadione  10 mg Oral Daily  . polyethylene glycol  17 g Oral BID  . predniSONE  40 mg Oral Daily  . rifaximin  550 mg Oral BID  . sodium chloride flush  3 mL Intravenous Q12H  . thiamine  100 mg Oral Daily    Continuous Infusions: . sodium chloride     PRN Meds:.sodium chloride, alum & mag hydroxide-simeth, diazepam, OLANZapine, ondansetron **OR** ondansetron (ZOFRAN) IV, oxyCODONE-acetaminophen, sodium chloride flush   Assessment: Principal Problem:   Steroid-induced psychosis, with delusions (HCC) Active Problems:   Alcohol abuse   ARF (acute renal failure) (HCC)   Subacute delirium   Brandy Cooper [id number] y.o.femaleadmitted with alcohlic hepatitis and liver failure , AKI. Commenced on prednisone 11/13/17 40 mg.No encephelopathy presentlyliver function tests's improving   Plan: 1.[name] score  0.113 points suggests to continue prednisolone decrease by 5 mg every week , She will need 28 days of therapy - Suggest to follow up 5-7 days from discharge with Dr Maximino Greenland in the outpatient . 2. Lactulose titrated to two soft bowel movements a day - if has diarrhea decrease doseas diarhea and dehydration worsens encephelopathy- stop checking ammonia. Continue Xifaxan  3. Stop all alcohol  4. Nutrition is key to recovery from alcoholic hepatitis along with cessation of alcohol. If dietician not seen patient then would recommend to do so  5. EGD as an outpatient to screen for varices. 6. I spoke to mother today in depth and explained to her as she is POA that the  patient should stop all alcohol, seek help from AA and follow up in the outpatient explained this condition has >50% mortality at 1 year.   I will sign off.  Please call me if any further GI concerns or questions.  We would like to thank you for the opportunity to participate in the care of Brandy Cooper.       LOS: 14 days   Wyline MoodKiran Janiene Aarons, MD 11/20/2017, 10:54 AM

## 2017-11-20 NOTE — Plan of Care (Signed)
Spoke with mother Brandy Cooper. She states Brandy Cooper is her daughter and she will be the POA and decision maker for her. Arranging family meeting today.

## 2017-11-20 NOTE — Progress Notes (Signed)
Brief Nutrition Note   Pt noted to have low Copper, vitamin D, and zinc labs.   Recommendations:  Copper 2mg  IV daily x 6 days  Ocuvite po BID- (provides 2000 IU vitamin A, vitamin C 400mg , vitamin E 120 IU, zinc 80mg , selenium 110 mcg, copper 4mg )  Vitamin D3- 3000IU po daily   Vitamin A, E, K, B1 labs pending   Betsey Holidayasey Kelani Robart MS, RD, LDN Pager #[phone]- [phone] After Hours Pager: [phone]

## 2017-11-20 NOTE — Progress Notes (Signed)
[facility] [place] [facility]   PATIENT NAME: Brandy Cooper    MR#:  [id number]  DATE OF BIRTH:  [birth year]  SUBJECTIVE:  CHIEF COMPLAINT:   Chief Complaint  Patient presents with  . Vaginal Bleeding  Patient is ok, lethargic today , seen by psych and agitated , dad and aunt are here REVIEW OF SYSTEMS:  Limited review of system  cONSTITUTIONAL: No fever, fatigue  EYES: No blurred or double vision.  EARS, NOSE, AND THROAT: No tinnitus or ear pain.  RESPIRATORY: No cough, shortness of breath, wheezing or hemoptysis.  CARDIOVASCULAR: No chest pain, orthopnea, edema.  GASTROINTESTINAL: No nausea, vomiting SKIN: No rash or lesion.    DRUG ALLERGIES:   Allergies  Allergen Reactions  . Celebrex [Celecoxib] Hives  . Morphine And Related Itching    VITALS:  Blood pressure 112/68, pulse 71, temperature 97.7 F (36.5 C), temperature source Oral, resp. rate 17, height 5\' 6"  (1.676 m), weight 117 kg (257 lb 15 oz), SpO2 97 %.  PHYSICAL EXAMINATION:  Physical Exam  GENERAL:  42 y.o.-year-old patient lying in the bed with no acute distress.  EYES: Pupils equal, round, reactive to light and accommodation.  Has scleral icterus. Extraocular muscles intact. Icteric sclerae HEENT: Head atraumatic, normocephalic. Oropharynx and nasopharynx clear.  NECK:  Supple, no jugular venous distention. No thyroid enlargement, no tenderness.  LUNGS: Normal breath sounds bilaterally, no wheezing, rales,rhonchi or crepitation. No use of accessory muscles of respiration. Decreased bibasilar breath sounds CARDIOVASCULAR: S1, S2 normal. No murmurs, rubs, or gallops.  ABDOMEN: Soft, obese, nontender, nondistended. Bowel sounds present. Intertrigo  EXTREMITIES: No pedal edema, cyanosis, or clubbing.  NEUROLOGIC:  Sensation intact. Gait not checked.  Global weakness noted PSYCHIATRIC: The patient is arousable and answers questions SKIN: No obvious rash, lesion, or ulcer.     LABORATORY PANEL:   CBC Recent Labs  Lab 11/17/17 0551  WBC 2.9*  HGB 7.8*  HCT 23.1*  PLT 65*   ------------------------------------------------------------------------------------------------------------------  Chemistries  Recent Labs  Lab 11/20/17 0617  NA 144  K 3.6  CL 114*  CO2 23  GLUCOSE 157*  BUN 39*  CREATININE 1.26*  CALCIUM 9.7  AST 90*  ALT 42  ALKPHOS 51  BILITOT 5.4*   ------------------------------------------------------------------------------------------------------------------  Cardiac Enzymes No results for input(s): TROPONINI in the last 168 hours. ------------------------------------------------------------------------------------------------------------------  RADIOLOGY:  No results found.  EKG:   Orders placed or performed during the hospital encounter of 11/20/2017  . ED EKG  . ED EKG  . EKG 12-Lead  . EKG 12-Lead  . EKG    ASSESSMENT AND PLAN:   Brandy Bossngela Cobbs  is a 42 y.o. female with a known history of anxiety and depression, status post laparoscopic gastric sleeve resection, dysfunctional uterine bleeding presents to hospital secondary to a fall.  #. 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 -Patient was counseled to stop drinking alcohol and she will be benefited with outpatient alcohol Anonymous -continue lactulose and xifaxan -patient is alert and tolerating medications by mouth this afternoon as reported by the RN Jonna Clark-[name] score  0.113 points  ,GI suggests to continue prednisolone decrease by 5 mg every week , She will need  TOTAL 28 days of therapy - Suggest to follow up 5-7 days from discharge with Dr Maximino Greenlandahiliani in the outpatient  GI signed off -IV albumin, vit K - EGD as outpt, okay to discharge patient from GI standpoint, discussed with Dr. 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  Acute renal failure-  probably hepatorenal syndrome -Renal ultrasound-normal -Renal function is getting worse creatinine 1.3-2.59-2.41 --1.46-1.29-1.26 Monitor intake and output- Nephrology following, okay to discharge patient from nephrology standpoint - could be hepatorenal, with new diagnosis of cirrhosis -continue  aldactone once BP improves -Hold metformin and other nephrotoxins  3.  Alcoholic hepatitis and IgM CMV positive CMV PCR sent ID recommended steroids for alcoholic hepatitis.  Patient is started on prednisolone 40 mg once daily will continue for 28 days and taper and a course of 2 weeks    4.  Depression/anxiety/subs abuse-appreciate psych input - continue the Wellbutrin & Xanax.     5. Bacterial vaginosis/intertrigo- flagyl 500mg  bid for 7 days course completed Clinically improving Nystatin powder  Out of bed as tolerated  6.  Dysfunctional uterine bleeding-going on for a few months, had a left ovarian cyst based on pelvic ultrasound 2 months ago. -Missed recent follow-up visits and increased bleeding with worsening anemia. -Appreciate OB/GYN consult-they have recommended outpatient follow-up since no active bleeding at this time  7.  Hematuria after mechanical trauma and thrombocytopenia clearing up with bladder irrigation  Check CBC in a.m.  8.  Failure to thrive-seen by palliative care Discussed with the family members stepdad  and aunt yesterday.Stepdad and aunt cannot take care of the patient and patient's healthcare power of attorney is her mom, have had a lengthy discussion with mom who is  requesting to  treat the patient as needed and place her to a rehab center    DVT prophylaxis-teds and SCDs.  Hold off on heparin products due to bleeding and thrombocytopenia    Physical therapy is recommending home health PT, will ask physical therapy to reassess the patient on Monday  All the records are reviewed and case discussed with Care Management/Social Workerr. Management plans discussed with the patient, mom at bedside.  Verbalized understanding of the plan CODE STATUS: Full Code  TOTAL TIME TAKING CARE OF THIS PATIENT: 33  minutes.   POSSIBLE D/C IN 1-2 DAYS, DEPENDING ON CLINICAL CONDITION.   Ramonita Lab M.D on 11/20/2017 at 3:10 PM  Between 7am to 6pm - Pager - [phone]  After 6pm go to [email or web address] - password [email or web address]  [facility]  Office  [phone]  CC: Primary care physician; Dortha Kern, MD

## 2017-11-21 ENCOUNTER — Inpatient Hospital Stay: Payer: BLUE CROSS/BLUE SHIELD

## 2017-11-21 LAB — AMMONIA: AMMONIA: 71 umol/L — AB (ref 9–35)

## 2017-11-21 LAB — PROTIME-INR
INR: 2.74
Prothrombin Time: 28.8 seconds — ABNORMAL HIGH (ref 11.4–15.2)

## 2017-11-21 MED ORDER — FUROSEMIDE 10 MG/ML IJ SOLN
40.0000 mg | INTRAMUSCULAR | Status: AC
  Administered 2017-11-21: 40 mg via INTRAVENOUS
  Filled 2017-11-21: qty 4

## 2017-11-21 MED ORDER — SODIUM CHLORIDE 0.9 % IV SOLN
INTRAVENOUS | Status: DC
  Filled 2017-11-21: qty 5

## 2017-11-21 MED ORDER — GUAIFENESIN-DM 100-10 MG/5ML PO SYRP
5.0000 mL | ORAL_SOLUTION | ORAL | Status: DC | PRN
  Administered 2017-11-21 (×2): 5 mL via ORAL
  Filled 2017-11-21 (×3): qty 5

## 2017-11-21 NOTE — Progress Notes (Signed)
   11/21/17 2144  Clinical Encounter Type  Visited With Patient;Health care provider  Visit Type Code (rapid response)  Spiritual Encounters  Spiritual Needs Prayer   Rapid response called to patient room.  No family present.  Chaplain maintained pastoral presence and offered silent prayer for patient and care team.  Chaplain spoke with [facility] regarding ongoing support availability and asked to be paged if patient/team needs changed.

## 2017-11-21 NOTE — Clinical Social Work Note (Signed)
CSW met with patient and her mother to discuss discharge planning.  Patient was alert and oriented x2.  Patient's mother has determined she is okay with becoming patient's HCPOA.  CSW explained to patient and her mother that CSW may not be able to find a local SNF placement that has an ALF section.  CSW informed patient's family who were at the bedside that the patient would have to go to a SNF either [facility] or [facility] and stay in the ALF side of the facility under difficult to place if family are agreeable to the plan.  CSW also informed patient's family that if patient clears up she may be able to go back home with mother and home health to follow patient's mother expressed understanding..   CSW updated assistant director Nathaniel Man of patient's situation, CSW to continue to follow.    Levitan Broom. Norval Morton, MSW, [place]  11/21/2017 1:49 AM#

## 2017-11-21 NOTE — Progress Notes (Signed)
MEDICATION RELATED CONSULT NOTE - INITIAL   Pharmacy Consult for Copper infusion (no actual consult placed) Indication: Copper deficiency  Allergies  Allergen Reactions  . Celebrex [Celecoxib] Hives  . Morphine And Related Itching    Patient Measurements: Height:  (167.6 cm) Weight: 257 lb 15 oz (117 kg) IBW/kg (Calculated) : 59.3  Vital Signs: Temp: 97.5 F (36.4 C) (04/27 2038) Temp Source: Oral (04/27 2038) BP: 143/123 (04/27 2219) Pulse Rate: 117 (04/27 2219) Intake/Output from previous day: 04/26 0701 - 04/27 0700 In: 0  Out: 675 [Urine:675] Intake/Output from this shift: Total I/O In: -  Out: 225 [Urine:225]  Labs: Recent Labs    11/19/17 0504 11/20/17 0617  CREATININE 1.29* 1.26*  ALBUMIN 4.7 4.3  PROT 7.3 6.9  AST 109* 90*  ALT 43 42  ALKPHOS 55 51  BILITOT 5.4* 5.4*   Estimated Creatinine Clearance: 76.4 mL/min (A) (by C-G formula based on SCr of 1.26 mg/dL (H)).   Microbiology: Recent Results (from the past 720 hour(s))  Wet prep, genital     Status: Abnormal   Collection Time: 11/24/2017  8:31 AM  Result Value Ref Range Status   Yeast Wet Prep HPF POC NONE SEEN NONE SEEN Final   Trich, Wet Prep NONE SEEN NONE SEEN Final   Clue Cells Wet Prep HPF POC PRESENT (A) NONE SEEN Final   WBC, Wet Prep HPF POC MODERATE (A) NONE SEEN Final   Sperm NONE SEEN  Final    Comment: Performed at [facility], [address] [place], [place] [address]  Chlamydia/NGC rt PCR [facility] only)     Status: None   Collection Time: 11/05/2017  8:31 AM  Result Value Ref Range Status   Specimen source GC/Chlam CHLAMYDIA SPECIES  Final   Chlamydia Tr NOT DETECTED NOT DETECTED Final   N gonorrhoeae NOT DETECTED NOT DETECTED Final    Comment: (NOTE) 100  This methodology has not been evaluated in pregnant women or in 200  patients with a history of hysterectomy. 300 400  This methodology will not be performed on patients less than 46  years of age. Performed  at [facility], [address], [place], [place] [address]     Medical History: Past Medical History:  Diagnosis Date  . Anxiety   . Bipolar affect, depressed (HCC)   . Depression     Medications:  Scheduled:  . ALPRAZolam  2 mg Oral QHS  . buPROPion  300 mg Oral Daily  . cholecalciferol  3,000 Units Oral Daily  . feeding supplement (ENSURE ENLIVE)  237 mL Oral BID BM  . folic acid  1 mg Oral Daily  . lactulose  20 g Oral BID  . lidocaine  1 patch Transdermal Q24H  . multivitamin with minerals  1 tablet Oral Daily  . multivitamin-lutein  1 capsule Oral BID  . neomycin-bacitracin-polymyxin   Topical TID  . nystatin   Topical TID  . OLANZapine zydis  10 mg Oral QHS  . OLANZapine zydis  5 mg Oral Daily  . polyethylene glycol  17 g Oral BID  . predniSONE  40 mg Oral Daily  . rifaximin  550 mg Oral BID  . sodium chloride flush  3 mL Intravenous Q12H  . thiamine  100 mg Oral Daily    Assessment: Patient admitted for altered mental status s/t to hyperammonemia/hepatic cirrhosis. Patient also has a h/o gastric bypass w/ gastric sleeve and was found to have Cu, Vit D, and Zinc  deficiency. Copper infusion of /250 ml over 24 hours has been started  Goal of Therapy:  Cu 72 - 166 mcg/dL Vit D > 30 ng/mL Zinc 56 - 134 ng/mL  Plan:  Currently the Cu infusion is due to give 1.99 mg Cu/day. Per the recommendations by the ASPEN guidelines the normal supplementation is 0.3 - 0.5 mg/day. Will decrease rate to 2.6 mL/hr which will provide 0.5 mg of Cu/day. Will recheck a Cu level w/ am labs.  Thomasene Ripple, PharmD, BCPS Clinical Pharmacist 11/21/2017

## 2017-11-21 NOTE — [place] FL2 (Signed)
[place] MEDICAID FL2 LEVEL OF CARE SCREENING TOOL     IDENTIFICATION  Patient Name: Brandy Cooper Birthdate: May 03, 1976 Sex: female Admission Date (Current Location): 11/11/2017  [facility] and [place] Number:  Engineer, civil (consulting) and Address:  [facility], [address], [place], [place] [address]      Provider Number: [id number]  Attending Physician Name and Address:  Katha Hamming, MD  Relative Name and Phone Number:       Current Level of Care: Hospital Recommended Level of Care: Assisted Living Facility Prior Approval Number:  Pending  Date Approved/Denied: 11/21/17 PASRR Number: Pending  Discharge Plan: Domiciliary (Rest home)    Current Diagnoses: Patient Active Problem List   Diagnosis Date Noted  . 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 Subacute delirium 11/11/2017  . ARF (acute renal failure) (HCC) 11/01/2017  . Substance induced mood disorder (HCC) 04/04/2015  . Alcohol abuse 04/04/2015  . Benzodiazepine overdose 04/04/2015    Orientation RESPIRATION BLADDER Height & Weight     Self, Place  Normal Continent, Indwelling catheter Weight: 257 lb 15 oz (117 kg) Height:   (167.6 cm)  BEHAVIORAL SYMPTOMS/MOOD NEUROLOGICAL BOWEL NUTRITION STATUS      Continent Diet  AMBULATORY STATUS COMMUNICATION OF NEEDS Skin   Independent Verbally Surgical wounds                       Personal Care Assistance Level of Assistance  Dressing, Feeding, Bathing Bathing Assistance: Independent Feeding assistance: Limited assistance Dressing Assistance: Limited assistance     Functional Limitations Info  Sight, Hearing, Speech Sight Info: Adequate Hearing Info: Adequate Speech Info: Adequate    SPECIAL CARE FACTORS FREQUENCY  PT (By licensed PT)     PT Frequency: mininum 2 times per week              Contractures      Additional Factors Info  Code Status, Allergies, Psychotropic  Code Status Info: DNR  Allergies Info: CELEBREX CELECOXIB, MORPHINE AND RELATED Psychotropic Info: ALPRAZolam (XANAX) tablet 2 mg and buPROPion (WELLBUTRIN XL) 24 hr tablet 300 mg and OLANZapine zydis (ZYPREXA) disintegrating tablet 10 mg          Current Medications (11/21/2017):  This is the current hospital active medication list Current Facility-Administered Medications  Medication Dose Route Frequency Provider Last Rate Last Dose  . 0.9 %  sodium chloride infusion  250 mL Intravenous PRN Enid Baas, MD      . ALPRAZolam Prudy Feeler) tablet 2 mg  2 mg Oral QHS Enid Baas, MD   2 mg at 11/20/17 2233  . alum & mag hydroxide-simeth (MAALOX/MYLANTA) 200-200-20 MG/5ML suspension 30 mL  30 mL Oral Q6H PRN Enid Baas, MD   30 mL at 11/08/17 2247  . buPROPion (WELLBUTRIN XL) 24 hr tablet 300 mg  300 mg Oral Daily Enid Baas, MD   300 mg at 11/20/17 1118  . cholecalciferol (VITAMIN D) tablet 3,000 Units  3,000 Units Oral Daily Ramonita Lab, MD   3,000 Units at 11/20/17 1706  . copper chloride 2 mg in sodium chloride 0.9 % 250 mL injection   Intravenous Q24H Gouru, Aruna, MD      . diazepam (VALIUM) tablet 5 mg  5 mg Oral Q8H PRN Enid Baas, MD   5 mg at 11/14/17 0113  . feeding supplement (ENSURE ENLIVE) (ENSURE ENLIVE) liquid 237 mL  237 mL Oral BID BM Vanga, Loel Dubonnet, MD   237 mL  at 11/20/17 1123  . folic acid (FOLVITE) tablet 1 mg  1 mg Oral Daily Enid Baas, MD   1 mg at 11/20/17 1117  . lactulose (CHRONULAC) 10 GM/15ML solution 20 g  20 g Oral BID Gouru, Aruna, MD   20 g at 11/20/17 2234  . lidocaine (LIDODERM) 5 % 1 patch  1 patch Transdermal Q24H Enid Baas, MD   1 patch at 11/20/17 1128  . multivitamin with minerals tablet 1 tablet  1 tablet Oral Daily Wyline Mood, MD   1 tablet at 11/20/17 1118  . multivitamin-lutein (OCUVITE-LUTEIN) capsule 1 capsule  1 capsule Oral BID Gouru, Aruna, MD      . neomycin-bacitracin-polymyxin (NEOSPORIN)  ointment   Topical TID Gouru, Aruna, MD      . nystatin (MYCOSTATIN/NYSTOP) topical powder   Topical TID Gouru, Aruna, MD      . OLANZapine (ZYPREXA) injection 10 mg  10 mg Intramuscular Once PRN Clapacs, John T, MD      . OLANZapine zydis (ZYPREXA) disintegrating tablet 10 mg  10 mg Oral QHS Clapacs, Jackquline Denmark, MD   10 mg at 11/20/17 2233  . OLANZapine zydis (ZYPREXA) disintegrating tablet 5 mg  5 mg Oral Daily Clapacs, Jackquline Denmark, MD   5 mg at 11/20/17 1119  . ondansetron (ZOFRAN) tablet 4 mg  4 mg Oral Q6H PRN Enid Baas, MD       Or  . ondansetron (ZOFRAN) injection 4 mg  4 mg Intravenous Q6H PRN Enid Baas, MD      . oxyCODONE-acetaminophen (PERCOCET/ROXICET) 5-325 MG per tablet 1 tablet  1 tablet Oral Q6H PRN Enid Baas, MD   1 tablet at 11/21/17 0128  . phytonadione (VITAMIN K) tablet 10 mg  10 mg Oral Daily Toney Reil, MD   10 mg at 11/20/17 1119  . polyethylene glycol (MIRALAX / GLYCOLAX) packet 17 g  17 g Oral BID Toney Reil, MD   17 g at 11/20/17 2245  . predniSONE (DELTASONE) tablet 40 mg  40 mg Oral Daily Gouru, Aruna, MD   40 mg at 11/20/17 1118  . rifaximin (XIFAXAN) tablet 550 mg  550 mg Oral BID Enid Baas, MD   550 mg at 11/20/17 2233  . sodium chloride flush (NS) 0.9 % injection 3 mL  3 mL Intravenous Q12H Enid Baas, MD   3 mL at 11/20/17 2235  . sodium chloride flush (NS) 0.9 % injection 3 mL  3 mL Intravenous PRN Enid Baas, MD      . thiamine (VITAMIN B-1) tablet 100 mg  100 mg Oral Daily Toney Reil, MD   100 mg at 11/20/17 1118  . trace elements Cr-Cu-Mn-Se-Zn (MTE-5 Concentrate) 2 mL in sodium chloride 0.9 % 250 mL injection   Intravenous Q24H Gouru, Aruna, MD 10.4 mL/hr at 11/20/17 1232       Discharge Medications: Please see discharge summary for a list of discharge medications.  Relevant Imaging Results:  Relevant Lab Results:   Additional Information SSN [id number]  Darleene Cleaver,  [place]

## 2017-11-21 NOTE — Progress Notes (Signed)
[facility] [place] at [facility]   PATIENT NAME: Brandy Cooper    MR#:  [id number]  DATE OF BIRTH:  01/01/1976  SUBJECTIVE: Slow to respond.did not see the patient before seeing first time today, got signout from Dr. Amado Coe.  CHIEF COMPLAINT:   Chief Complaint  Patient presents with  . Vaginal Bleeding  Patient has some cough, ordered chest x-ray. REVIEW OF SYSTEMS:  Limited review of system  cONSTITUTIONAL: No fever, fatigue  EYES: No blurred or double vision.  EARS, NOSE, AND THROAT: No tinnitus or ear pain.  RESPIRATORY: Patient has some cough but no shortness of breath.  CARDIOVASCULAR: No chest pain, orthopnea, edema.  GASTROINTESTINAL: No nausea, vomiting SKIN: No rash or lesion.    DRUG ALLERGIES:   Allergies  Allergen Reactions  . Celebrex [Celecoxib] Hives  . Morphine And Related Itching    VITALS:  Blood pressure 119/66, pulse 88, temperature 97.7 F (36.5 C), temperature source Oral, resp. rate 18, height  (1.676 m), weight 117 kg (257 lb 15 oz), SpO2 93 %.  PHYSICAL EXAMINATION:  Physical Exam  GENERAL:  42 y.o.-year-old patient lying in the bed with no acute distress.,  Lethargic. EYES: Pupils equal, round, reactive to light and accommodation.  Has scleral icterus. Extraocular muscles intact. Icteric sclerae HEENT: Head atraumatic, normocephalic. Oropharynx and nasopharynx clear.  NECK:  Supple, no jugular venous distention. No thyroid enlargement, no tenderness.  LUNGS: Normal breath sounds bilaterally, no wheezing, rales,rhonchi or crepitation. No use of accessory muscles of respiration. Decreased bibasilar breath sounds CARDIOVASCULAR: S1, S2 normal. No murmurs, rubs, or gallops.  ABDOMEN: Soft, obese, nontender, nondistended. Bowel sounds present. Intertrigo  EXTREMITIES: No pedal edema, cyanosis, or clubbing.  NEUROLOGIC:  Sensation intact. Gait not checked.  Global weakness noted PSYCHIATRIC: The patient is arousable and  answers questions SKIN: No obvious rash, lesion, or ulcer.    LABORATORY PANEL:   CBC Recent Labs  Lab 11/17/17 0551  WBC 2.9*  HGB 7.8*  HCT 23.1*  PLT 65*   ------------------------------------------------------------------------------------------------------------------  Chemistries  Recent Labs  Lab 11/20/17 0617  NA 144  K 3.6  CL 114*  CO2 23  GLUCOSE 157*  BUN 39*  CREATININE 1.26*  CALCIUM 9.7  AST 90*  ALT 42  ALKPHOS 51  BILITOT 5.4*   ------------------------------------------------------------------------------------------------------------------  Cardiac Enzymes No results for input(s): TROPONINI in the last 168 hours. ------------------------------------------------------------------------------------------------------------------  RADIOLOGY:  No results found.  EKG:   Orders placed or performed during the hospital encounter of Nov 29, 2017  . ED EKG  . ED EKG  . EKG 12-Lead  . EKG 12-Lead  . EKG    ASSESSMENT AND PLAN:   Brandy Cooper  is a 42 y.o. female with a known history of anxiety and depression, status post laparoscopic gastric sleeve resection, dysfunctional uterine bleeding presents to hospital secondary to a fall.  #. 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 -Patient was counseled to stop drinking alcohol and she will be benefited with outpatient alcohol Anonymous -Continue lactulose, Xifaxan, -Brandy Cooper  0.113 points  ,GI suggests to continue prednisolone decrease by 5 mg every week , She will need  TOTAL 28 days of therapy - Suggest to follow up 5-7 days from discharge with Dr Maximino Greenland in the outpatient  GI signed off  -IV albumin, vit K - EGD as outpt, okay to discharge patient from GI standpoint, discussed with Dr. 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 Giving Ocuvite, vitamin D3, copper  3.  Acute renal failure-  probably hepatorenal syndrome -Renal ultrasound-normal -Renal function stable now.1.3-2.59-2.41 --1.46-1.29-1.26 Monitor intake and output- Nephrology following, okay to discharge patient from nephrology standpoint - could be hepatorenal, with new diagnosis of cirrhosis -continue  aldactone once BP improves -Hold metformin and other nephrotoxins  3.  Alcoholic hepatitis and IgM CMV positive CMV PCR sent ID recommended steroids for alcoholic hepatitis.  Patient is started on prednisolone 40 mg once daily will continue for 28 days and taper and a course of 2 weeks    4.  Depression/anxiety/subs abuse-appreciate psych input - continue the Wellbutrin & Xanax.     5. Bacterial vaginosis/intertrigo- flagyl  bid for 7 days course completed Clinically improving Nystatin powder  Out of bed as tolerated  6.  Dysfunctional uterine bleeding-going on for a few months, had a left ovarian cyst based on pelvic ultrasound 2 months ago. -Missed recent follow-up visits and increased bleeding with worsening anemia. -Appreciate OB/GYN consult-they have recommended outpatient follow-up since no active bleeding at this time  7.  Hematuria after mechanical trauma and thrombocytopenia clearing up with bladder irrigation   8.  Failure to thrive-seen by palliative care Discussed with the family members stepdad  and aunt yesterday.Stepdad and aunt cannot take care of the patient and patient's healthcare power of attorney is her mom, have had a lengthy discussion with mom who is  requesting to treat the patient as needed and place her to a rehab center    DVT prophylaxis-teds and SCDs.  Hold off on heparin products due to bleeding and thrombocytopenia      All the records are reviewed and case discussed with Care Management/Social Workerr. Management plans discussed with the patient, mom at bedside.  Verbalized understanding of the plan CODE STATUS: Full Code  TOTAL TIME TAKING CARE OF THIS PATIENT: 33  minutes.   POSSIBLE D/C IN 1-2 DAYS, DEPENDING ON CLINICAL CONDITION.   Katha Hamming M.D on 11/21/2017 at 12:43 PM  Between 7am to 6pm - Pager - [phone]  After 6pm go to [email or web address] - password [email or web address]  [facility]  Office  [phone]  CC: Primary care physician; Dortha Kern, MD

## 2017-11-21 NOTE — Progress Notes (Signed)
Responded to rapid response. No respiratory interventions at this time

## 2017-11-22 DIAGNOSIS — F19950 Other psychoactive substance use, unspecified with psychoactive substance-induced psychotic disorder with delusions: Secondary | ICD-10-CM

## 2017-11-22 DIAGNOSIS — K7200 Acute and subacute hepatic failure without coma: Secondary | ICD-10-CM

## 2017-11-22 DIAGNOSIS — J9601 Acute respiratory failure with hypoxia: Secondary | ICD-10-CM

## 2017-11-22 DIAGNOSIS — K7682 Hepatic encephalopathy: Secondary | ICD-10-CM

## 2017-11-22 DIAGNOSIS — K7290 Hepatic failure, unspecified without coma: Secondary | ICD-10-CM

## 2017-11-22 DIAGNOSIS — N179 Acute kidney failure, unspecified: Secondary | ICD-10-CM

## 2017-11-22 LAB — GLUCOSE, CAPILLARY: Glucose-Capillary: 184 mg/dL — ABNORMAL HIGH (ref 65–99)

## 2017-11-22 LAB — PROTIME-INR
INR: 2.54
PROTHROMBIN TIME: 27.1 s — AB (ref 11.4–15.2)

## 2017-11-22 LAB — CBC WITH DIFFERENTIAL/PLATELET
BASOS ABS: 0 10*3/uL (ref 0–0.1)
BASOS PCT: 0 %
EOS ABS: 0 10*3/uL (ref 0–0.7)
EOS PCT: 0 %
HEMATOCRIT: 28 % — AB (ref 35.0–47.0)
Hemoglobin: 9.4 g/dL — ABNORMAL LOW (ref 12.0–16.0)
Lymphocytes Relative: 2 %
Lymphs Abs: 0.2 10*3/uL — ABNORMAL LOW (ref 1.0–3.6)
MCH: 36.9 pg — ABNORMAL HIGH (ref 26.0–34.0)
MCHC: 33.4 g/dL (ref 32.0–36.0)
MCV: 110.5 fL — ABNORMAL HIGH (ref 80.0–100.0)
MONO ABS: 0.2 10*3/uL (ref 0.2–0.9)
MONOS PCT: 2 %
Neutro Abs: 11.3 10*3/uL — ABNORMAL HIGH (ref 1.4–6.5)
Neutrophils Relative %: 96 %
PLATELETS: 91 10*3/uL — AB (ref 150–440)
RBC: 2.54 MIL/uL — ABNORMAL LOW (ref 3.80–5.20)
RDW: 23.4 % — ABNORMAL HIGH (ref 11.5–14.5)
WBC: 11.7 10*3/uL — ABNORMAL HIGH (ref 3.6–11.0)

## 2017-11-22 LAB — COMPREHENSIVE METABOLIC PANEL
ALT: 43 U/L (ref 14–54)
ANION GAP: 8 (ref 5–15)
AST: 78 U/L — AB (ref 15–41)
Albumin: 4.2 g/dL (ref 3.5–5.0)
Alkaline Phosphatase: 65 U/L (ref 38–126)
BUN: 35 mg/dL — AB (ref 6–20)
CHLORIDE: 107 mmol/L (ref 101–111)
CO2: 23 mmol/L (ref 22–32)
Calcium: 9.4 mg/dL (ref 8.9–10.3)
Creatinine, Ser: 1.12 mg/dL — ABNORMAL HIGH (ref 0.44–1.00)
GFR calc Af Amer: >60 mL/min (ref >60)
GFR calc non Af Amer: >60 mL/min (ref >60)
GLUCOSE: 184 mg/dL — AB (ref 65–99)
Potassium: 3.9 mmol/L (ref 3.5–5.1)
SODIUM: 138 mmol/L (ref 135–145)
TOTAL PROTEIN: 7.2 g/dL (ref 6.5–8.1)
Total Bilirubin: 6.1 mg/dL — ABNORMAL HIGH (ref 0.3–1.2)

## 2017-11-22 LAB — VITAMIN E
VITAMIN E(GAMMA TOCOPHEROL): 0.2 mg/L — AB (ref 0.5–5.5)
Vitamin E (Alpha Tocopherol): 3.6 mg/L — ABNORMAL LOW (ref 7.0–25.1)

## 2017-11-22 LAB — VITAMIN B1: VITAMIN B1 (THIAMINE): 85 nmol/L (ref 66.5–200.0)

## 2017-11-22 LAB — MRSA PCR SCREENING: MRSA by PCR: NEGATIVE

## 2017-11-22 MED ORDER — LORAZEPAM 2 MG/ML IJ SOLN: 1.0000 mg | INTRAMUSCULAR | Status: DC | PRN

## 2017-11-22 MED ORDER — SCOPOLAMINE 1 MG/3DAYS TD PT72
1.0000 | MEDICATED_PATCH | TRANSDERMAL | Status: DC
  Filled 2017-11-22: qty 1

## 2017-11-22 MED ORDER — GLYCOPYRROLATE 0.2 MG/ML IJ SOLN
0.4000 mg | INTRAMUSCULAR | Status: DC | PRN
  Administered 2017-11-22: 0.2 mg via INTRAVENOUS
  Filled 2017-11-22: qty 2

## 2017-11-22 MED ORDER — FUROSEMIDE 10 MG/ML IJ SOLN
40.0000 mg | Freq: Once | INTRAMUSCULAR | Status: AC
  Administered 2017-11-22: 40 mg via INTRAVENOUS
  Filled 2017-11-22: qty 4

## 2017-11-22 MED ORDER — LORAZEPAM BOLUS VIA INFUSION: 1.0000 mg | INTRAVENOUS | Status: DC | PRN

## 2017-11-22 MED ORDER — MORPHINE 100MG IN NS 100ML (1MG/ML) PREMIX INFUSION
5.0000 mg/h | INTRAVENOUS | Status: DC
  Administered 2017-11-22: 5 mg/h via INTRAVENOUS
  Administered 2017-11-22: 4 mg/h via INTRAVENOUS
  Filled 2017-11-22: qty 100

## 2017-11-22 MED ORDER — GLYCOPYRROLATE 0.2 MG/ML IJ SOLN
0.2000 mg | INTRAMUSCULAR | Status: DC | PRN
  Administered 2017-11-22 (×2): 0.2 mg via INTRAVENOUS
  Filled 2017-11-22 (×2): qty 1

## 2017-11-22 MED ORDER — MORPHINE BOLUS VIA INFUSION
5.0000 mg | INTRAVENOUS | Status: DC | PRN
  Filled 2017-11-22: qty 20

## 2017-11-22 MED ORDER — ATROPINE SULFATE 1 % OP SOLN
2.0000 [drp] | Freq: Four times a day (QID) | OPHTHALMIC | Status: DC | PRN
  Filled 2017-11-22: qty 2

## 2017-11-24 ENCOUNTER — Telehealth: Payer: Self-pay | Admitting: *Deleted

## 2017-11-24 LAB — COPPER, SERUM: Copper: 45 ug/dL — ABNORMAL LOW (ref 72–166)

## 2017-11-24 LAB — VITAMIN A: VITAMIN A (RETINOIC ACID): 7.7 ug/dL — AB (ref 20.1–62.0)

## 2017-11-24 NOTE — Telephone Encounter (Signed)
Death cert faxed to [facility] per their request at [phone].

## 2017-11-24 NOTE — Telephone Encounter (Signed)
Received death certificate for patient via fax. Place in MD folder for completion.

## 2017-11-25 NOTE — Significant Event (Signed)
Rapid Response Event Note  Overview:   Event Type: Respiratory  Initial Focused Assessment:   Interventions:  Plan of Care (if not transferred):  Event Summary: Name of Physician Notified: Dr. Caryn Bee    Patient with decreased responsiveness, o2 sats 92% on 5L, crackles in lungs. Chest Xray and  lasix ordered IV. Dr. Caryn Bee to come and assess patient.  Outcome: Stayed in room and stabalized  Event End Time: 2245  Jodelle Green

## 2017-11-25 NOTE — Progress Notes (Signed)
   11/21/17 2355  Clinical Encounter Type  Visited With Patient;Health care provider  Visit Type Code (rapid response)  Spiritual Encounters  Spiritual Needs Prayer   Rapid response called to patient room.  Family not present, but mother has been notified and is en route.  Chaplain maintained pastoral presence and offered silent prayers for patient and care team.  Chaplain checked in with patient nurse before leaving; patient likely transferring to CCU.

## 2017-11-25 NOTE — Progress Notes (Signed)
CH responded to page for consult with patient and family as patient will be passing soon. CH arrived as patient had just passed. CH provided prayer shawl, prayer, emotional/grief support for family. CH allowed the family space to begin the grieving process. Patient's mother was to the left of the patient's bed. CH provided her with the prayer shawl and prayer. CH is available for follow-up as needed by family or staff. CH continued to provide silent prayer from outside the room as family was exiting.

## 2017-11-25 NOTE — Plan of Care (Signed)
Patient is an in hospital transfer.  Patient having SOB.  Patient status changed to comfort care.  Rebreather mask changed to nasal canula.  Family at bedside. Will continue to monitor.  Patient not responding to commands. Morphine infusing at /hr.

## 2017-11-25 NOTE — Significant Event (Signed)
Rapid Response Event Note  Overview:   Event Type: Respiratory   Initial Focused Assessment: patient continues to have crackles with decreased o2 saturations. MD notified and arrives at bedside    Interventions: orders for pt transfer to ICU  Plan of Care (if not transferred):  Event Summary: Name of Physician Notified: Dr. Caryn Bee at      at      [facility]

## 2017-11-25 NOTE — Progress Notes (Signed)
RN called pharmacy to inquire about possible interactions of Lasix with current infusion of Copper Chloride. Pharmacist,  DB unaware of any known interactions Patient's rate was changed @ 2220 from 10.42ml/hour to 2.8 per calculations of pharmacy for  doseage

## 2017-11-25 NOTE — Progress Notes (Signed)
RN notified Dr. Peggye Pitt and Elnita Maxwell, RN -[facility] of patient's time of death.

## 2017-11-25 NOTE — Progress Notes (Signed)
Patient status changed, rapid response team and prime doctor assessed.  Due to change in breathing patterns, need for bipap patient to be transferred to ICU for higher acuity of care.  Family notified of changes.

## 2017-11-25 NOTE — Death Summary Note (Signed)
DEATH SUMMARY   Patient Details  Name: Brandy Cooper MRN: [id number] DOB: [birth year]  Admission/Discharge Information   Admit Date:  Nov 30, 2017  Date of Death: Date of Death: 12-16-17  Time of Death: Time of Death: 0900  Length of Stay: 01/04/2023  Referring Physician: Dortha Kern, MD   Reason(s) for Hospitalization  Vaginal bleeding  Diagnoses  Preliminary cause of death: Hepatic failure  Secondary Diagnoses (including complications and co-morbidities):  Principal Problem:   Steroid-induced psychosis, with delusions (HCC) Active Problems:   Alcohol abuse   ARF (acute renal failure) (HCC)   Subacute delirium Acute hypoxic respiratory failure Acute CHF exacerbation Acute hepatic encephalopathy Acute on chronic renal failure 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 year old female with multiple comorbidities, S/P Gastric sleeve admitted 11/30/17 with vaginal bleeding that started I November and a fall.She's had a long and complicated hospital course with severe progressive  hepatic encephalopathy, alcoholic liver cirrhosis, severe copper deficiency, acute renal failure, alcoholic hepatitis with IgM CMV +, bacteria vaginosis and adult failure to thrive. She was transferred to the ICU on 16-Dec-2017 because of worsening hypoxia. She was already a DNR/DNI. Family decided to proceed with full comfort care. Withdrawal of care orders placed and patient expired peacefully   Pertinent Labs and Studies  Significant Diagnostic Studies Dg Chest 1 View  Result Date: 11/21/2017 CLINICAL DATA:  Acute onset shortness of breath EXAM: CHEST  1 VIEW COMPARISON:  11/21/2017 FINDINGS: Since the previous study there is increase of diffuse bilateral alveolar pulmonary infiltrates  consistent with progressing edema or multifocal pneumonia. No blunting of costophrenic angles. No pneumothorax. Heart size is obscured by the parenchymal process but appears mildly enlarged. Calcification of the aorta. IMPRESSION: Increasing diffuse bilateral pulmonary infiltrates since previous study suggesting progressing edema or multifocal pneumonia. Electronically Signed   By: Burman Nieves M.D.   On: 11/21/2017 22:27   Dg Chest 1 View  Result Date: 11/21/2017 CLINICAL DATA:  Cough. Recent fall. Post laparoscopic gastric sleeve resection with dysfunctional uterine bleeding. EXAM: CHEST  1 VIEW COMPARISON:  09/19/2014 FINDINGS: Lungs are adequately inflated demonstrate patchy bilateral hazy opacification left worse than right likely infection and less likely asymmetric edema. No evidence of effusion. Cardiomediastinal silhouette and remainder the exam is unchanged. IMPRESSION: Hazy bilateral patchy opacification left greater than right likely infection and less likely asymmetric edema. Electronically Signed   By: Elberta Fortis M.D.   On: 11/21/2017 17:26   Dg Lumbar Spine 2-3 Views  Result Date: 11/10/2017 CLINICAL DATA:  Low back pain for 2 years, recent fall with increased pain, initial encounter EXAM: LUMBAR SPINE - 3 VIEW COMPARISON:  None. FINDINGS: Five lumbar type vertebral bodies are well visualized. Disc space narrowing is noted at L3-4, L4-5 and L5-S1. Mild osteophytic changes are seen. No anterolisthesis is noted. No soft tissue changes are noted. IMPRESSION: Degenerative change without acute abnormality. Electronically Signed   By: Alcide Clever M.D.   On: 11/10/2017 08:30   Dg Abd 1 View  Result Date: 11/10/2017 CLINICAL DATA:  Constipation for 2-3 days, history of cirrhosis EXAM: ABDOMEN - 1 VIEW COMPARISON:  Lumbar spine films of 11/10/2017 FINDINGS: Supine views of the abdomen show no evidence of bowel obstruction. Surgical clips are present in the right upper quadrant from prior  cholecystectomy and sutures  are noted in the left upper quadrant possibly due to prior gastric surgery. IUD is noted in the mid pelvis. No opaque calculi are seen. There are degenerative changes in the lower lumbar spine. IMPRESSION: 1. No bowel obstruction. 2. No opaque calculi. 3. Degenerative change in the mid to lower lumbar spine Electronically Signed   By: Dwyane Dee M.D.   On: 11/10/2017 11:57   US Pelvis Transvanginal Non-ob (tv Only)  Result Date: 11/23/17 CLINICAL DATA:  Vaginal bleeding for several months EXAM: TRANSABDOMINAL AND TRANSVAGINAL ULTRASOUND OF PELVIS TECHNIQUE: Both transabdominal and transvaginal ultrasound examinations of the pelvis were performed. Transabdominal technique was performed for global imaging of the pelvis including uterus, ovaries, adnexal regions, and pelvic cul-de-sac. It was necessary to proceed with endovaginal exam following the transabdominal exam to visualize the ovaries. COMPARISON:  09/22/2012 CT of the abdomen pelvis. FINDINGS: Uterus Measurements: 7.4 x 4.3 x 5.1 cm. No fibroids or other mass visualized. Nabothian cysts are noted. Endometrium Thickness: 2.8 mm.  IUD is noted in place. Right ovary Measurements: 4.0 x 2.6 x 3.4 cm. Normal appearance/no adnexal mass. Left ovary Measurements: 3.0 x 2.3 x 2.1 cm. Normal appearance/no adnexal mass. Other findings Moderate free pelvic fluid is noted. This may be physiologic in nature. IMPRESSION: IUD in place. Moderate free pelvic fluid. This may be physiologic in nature. Possibility of recent ovarian cyst rupture could not be totally excluded. No findings of ovarian cysts are seen. Electronically Signed   By: Alcide Clever M.D.   On: Nov 23, 2017 10:10   US Pelvis Complete  Result Date: 11/23/2017 CLINICAL DATA:  Vaginal bleeding for several months EXAM: TRANSABDOMINAL AND TRANSVAGINAL ULTRASOUND OF PELVIS TECHNIQUE: Both transabdominal and transvaginal ultrasound examinations of the pelvis were performed.  Transabdominal technique was performed for global imaging of the pelvis including uterus, ovaries, adnexal regions, and pelvic cul-de-sac. It was necessary to proceed with endovaginal exam following the transabdominal exam to visualize the ovaries. COMPARISON:  09/22/2012 CT of the abdomen pelvis. FINDINGS: Uterus Measurements: 7.4 x 4.3 x 5.1 cm. No fibroids or other mass visualized. Nabothian cysts are noted. Endometrium Thickness: 2.8 mm.  IUD is noted in place. Right ovary Measurements: 4.0 x 2.6 x 3.4 cm. Normal appearance/no adnexal mass. Left ovary Measurements: 3.0 x 2.3 x 2.1 cm. Normal appearance/no adnexal mass. Other findings Moderate free pelvic fluid is noted. This may be physiologic in nature. IMPRESSION: IUD in place. Moderate free pelvic fluid. This may be physiologic in nature. Possibility of recent ovarian cyst rupture could not be totally excluded. No findings of ovarian cysts are seen. Electronically Signed   By: Alcide Clever M.D.   On: 11-23-2017 10:10   US Renal  Result Date: 11/15/2017 CLINICAL DATA:  Acute kidney injury. EXAM: RENAL / URINARY TRACT ULTRASOUND COMPLETE COMPARISON:  11/09/2017. FINDINGS: Right Kidney: Length: 10.4 cm. Echogenicity within normal limits. No mass or hydronephrosis visualized. Left Kidney: Length: 10.4 cm. Again demonstrated is diffuse cortical thinning with prominent renal sinus fat. Normal parenchymal echotexture. No hydronephrosis. The previously demonstrated exophytic lower pole cyst was not imaged today. Bladder: Not visualized. The examination was limited by body habitus and lack of bladder distention. IMPRESSION: 1. No acute abnormality. 2. Stable mild diffuse left renal cortical atrophy. Electronically Signed   By: Beckie Salts M.D.   On: 11/15/2017 10:58   US Renal  Result Date: 11/09/2017 CLINICAL DATA:  Acute renal failure EXAM: RENAL ULTRASOUND COMPARISON:  CT abdomen and pelvis September 22, 2012 FINDINGS: Right Kidney: Length:  10.2 cm.  Echogenicity and renal cortical thickness are within normal limits. No mass, perinephric fluid, or hydronephrosis visualized. No sonographically demonstrable calculus or ureterectasis. Left Kidney: Length: 11.3 cm. Echogenicity within normal limits. There is renal cortical thinning on the left. No perinephric fluid or hydronephrosis visualized. There is a cyst arising from the lower pole left kidney measuring 2.9 x 2.7 x 3.0 cm. No sonographically demonstrable calculus or ureterectasis. Bladder: Empty and could not be visualized. IMPRESSION: Renal cortical thinning on the left, a finding that may be associated with medical renal disease. Renal cortical thickness on the right is within normal limits. Renal echogenicity normal bilaterally. No obstructing focus in either kidney. There is a cyst in the lower pole left kidney region. Electronically Signed   By: Bretta Bang III M.D.   On: 11/09/2017 11:28   Mr Abdomen Mrcp Wo Contrast  Result Date: 11/09/2017 CLINICAL DATA:  Cirrhosis. Surgical history of gastric sleeve and cholecystectomy. Hyperbilirubinemia. EXAM: MRI ABDOMEN WITHOUT CONTRAST  (INCLUDING MRCP) TECHNIQUE: Multiplanar multisequence MR imaging of the abdomen was performed. Heavily T2-weighted images of the biliary and pancreatic ducts were obtained, and three-dimensional MRCP images were rendered by post processing. COMPARISON:  CT 09/22/2012 FINDINGS: Exam is degraded by patient respiratory motion. Patient encephalopathic and could not follow breath hold commands. Free breathing technique use. Lower chest:  Lung bases are clear. Hepatobiliary: Marked loss of signal intensity on opposed phase imaging consistent with hepatic steatosis (series 18). No intrahepatic biliary duct dilatation. The common bile duct is mildly dilated to 9 mm following cholecystectomy. No choledocholithiasis. No IV contrast administered. Pancreas: Normal pancreatic parenchymal intensity. No ductal dilatation or  inflammation. Spleen: Moderate splenomegaly with the spleen measuring 16 cm in craniocaudad dimension Adrenals/urinary tract: Adrenal glands and kidneys are normal. Stomach/Bowel: Stomach limited view of the bowel is unremarkable. Vascular/Lymphatic: Normal caliber Musculoskeletal: No aggressive osseous lesion IMPRESSION: 1. No evidence of biliary obstruction. Mild extrahepatic duct dilatation typical following cholecystectomy. 2. Severe hepatic steatosis. 3. No discrete lesion liver identified on noncontrast exam. 4. Mild splenomegaly. 5. No ascites. Electronically Signed   By: Genevive Bi M.D.   On: 11/09/2017 23:37   US Abdomen Limited Ruq  Result Date: 11/07/2017 CLINICAL DATA:  Hepatic cirrhosis EXAM: ULTRASOUND ABDOMEN LIMITED RIGHT UPPER QUADRANT COMPARISON:  CT abdomen and pelvis September 22, 2012 FINDINGS: Gallbladder: Surgically absent. Common bile duct: Diameter: 12 mm proximally. Distal common bile duct could not be visualized due to overlying gas. Liver: No focal lesion identified. Liver echogenicity is diffusely increased. Portal vein is patent on color Doppler imaging with normal direction of blood flow towards the liver. Other: There is trace ascites. IMPRESSION: 1. Gallbladder absent. Visualized common bile duct is dilated to 12 mm. More distal common bile duct could not be visualized due to overlying gas. This finding may warrant MRCP to further evaluate given the dilatation proximally. 2. Diffuse increase in liver echogenicity, a finding indicative of hepatic steatosis, potentially with underlying parenchymal liver disease. While no focal liver lesions are identified on this study, it must be cautioned that the sensitivity of ultrasound for detection of focal liver lesions is diminished significantly in this circumstance. 3.  Trace ascites. Electronically Signed   By: Bretta Bang III M.D.   On: 11/07/2017 17:09    Microbiology Recent Results (from the past 240 hour(s))  MRSA  PCR Screening     Status: None   Collection Time: 2017/12/07  7:14 AM  Result Value Ref Range Status   MRSA  by PCR NEGATIVE NEGATIVE Final    Comment:        The GeneXpert MRSA Assay (FDA approved for NASAL specimens only), is one component of a comprehensive MRSA colonization surveillance program. It is not intended to diagnose MRSA infection nor to guide or monitor treatment for MRSA infections. Performed at [facility], [address] [place], [place] [address]     Lab Basic Metabolic Panel: Recent Labs  Lab 11/17/17 0557 11/18/17 1427 11/19/17 0504 11/20/17 0617 12-18-17 0022  NA 136 141 142 144 138  K 3.5 3.4* 3.7 3.6 3.9  CL 110 113* 113* 114* 107  CO2 19* 19* 19* 23 23  GLUCOSE 143* 153* 139* 157* 184*  BUN 39* 38* 40* 39* 35*  CREATININE 2.01* 1.46* 1.29* 1.26* 1.12*  CALCIUM 9.1 9.6 9.6 9.7 9.4   Liver Function Tests: Recent Labs  Lab 11/17/17 0557 11/18/17 1427 11/19/17 0504 11/20/17 0617 12/18/2017 0022  AST 104* 121* 109* 90* 78*  ALT 33 41 43 42 43  ALKPHOS 63 60 55 51 65  BILITOT 4.1* 5.3* 5.4* 5.4* 6.1*  PROT 6.7 7.2 7.3 6.9 7.2  ALBUMIN 3.9 4.3 4.7 4.3 4.2   No results for input(s): LIPASE, AMYLASE in the last 168 hours. Recent Labs  Lab 11/17/17 0557 11/18/17 1420 11/19/17 0504 11/20/17 0616 11/21/17 0719  AMMONIA 176* 110* 123* 59* 71*   CBC: Recent Labs  Lab 11/17/17 0551 12/18/2017 0022  WBC 2.9* 11.7*  NEUTROABS 2.3 11.3*  HGB 7.8* 9.4*  HCT 23.1* 28.0*  MCV 109.1* 110.5*  PLT 65* 91*   Cardiac Enzymes: No results for input(s): CKTOTAL, CKMB, CKMBINDEX, TROPONINI in the last 168 hours. Sepsis Labs: Recent Labs  Lab 11/17/17 0551 Dec 18, 2017 0022  WBC 2.9* 11.7*    Procedures/Operations  Liver and renal ultrasound Vaginal ultrasound   Annett Fabian 12-18-2017, 6:49 PM

## 2017-11-25 NOTE — Progress Notes (Signed)
Asystole per cardiac monitor therefore this RN and Florentina Addison, RN went to bedside and auscultated for heart tones for a full minute and no heart tones heard. Apnic. Time of death pronounced at 0900.  Family at bedside including mother.

## 2017-11-25 NOTE — Consult Note (Addendum)
PULMONARY / CRITICAL CARE MEDICINE   Name: Brandy Cooper MRN: [id number] DOB: 12-31-1975    ADMISSION DATE:  10/27/2017   CONSULTATION DATE:  12/18/2017  REFERRING MD:  Dr Caryn Bee  REASON: Acute hypoxic respiratory failure  HISTORY OF PRESENT ILLNESS:   This is a 42 y/o female with multiple comorbidities, S/P Gastric sleeve admitted 11/02/2017 with vaginal bleeding that started I November and a fall.She's had a long and complicated hospital course with severe hepatic encephalopathy, alcoholic liver cirrhosis, severe copper deficiency, acute renal failure, alcoholic hepatitis with IgM CMV +, bacteria vaginosis and adult failure to thrive. This evening, a rapid response was called due to hypoxia and acute respiratory distress. Her CXR showed worsening bilateral pulmonary infiltrates. She was placed on a non-rebreather mask and transferred to the ICU for further management. Upon arrival in the ICU, family decided to make patient full comfort care based on patient's expressed wishes and meeting with palliative care team on 11/20/17. She was already a DNR/DNI. Withdrawal of care orders placed.   PAST MEDICAL HISTORY :  She  has a past medical history of Anxiety, Bipolar affect, depressed (HCC), and Depression.  PAST SURGICAL HISTORY: She  has a past surgical history that includes Laparoscopic gastric sleeve resection; Tonsillectomy; and Cholecystectomy.  Allergies  Allergen Reactions  . Celebrex [Celecoxib] Hives  . Morphine And Related Itching    No current facility-administered medications on file prior to encounter.    Current Outpatient Medications on File Prior to Encounter  Medication Sig  . ALPRAZolam (XANAX) 1 MG tablet Take 2 mg by mouth at bedtime.   Marland Kitchen buPROPion (WELLBUTRIN XL) 300 MG 24 hr tablet Take 300 mg by mouth daily.  . metFORMIN (GLUCOPHAGE) 500 MG tablet Take 1,500 mg by mouth daily.   . valACYclovir (VALTREX) 500 MG tablet Take 500 mg by mouth once. As needed  .  cephALEXin (KEFLEX) 500 MG capsule Take 1 capsule (500 mg total) by mouth every 12 (twelve) hours. (Patient not taking: Reported on 11/23/2017)  . diazepam (VALIUM) 5 MG tablet Take 1 tablet (5 mg total) by mouth every 8 (eight) hours as needed for muscle spasms.    FAMILY HISTORY:  Her has no family status information on file.    SOCIAL HISTORY: She  reports that she has quit smoking. Her smoking use included cigarettes. She has never used smokeless tobacco. She reports that she does not drink alcohol or use drugs.  REVIEW OF SYSTEMS:   Unable to obtain as patient is unresponsive  SUBJECTIVE:    VITAL SIGNS: BP 106/79 (BP Location: Left Arm)   Pulse 81   Temp 97.9 F (36.6 C) (Axillary)   Resp (!) 26   Ht  (1.676 m)   Wt 260 lb 9.3 oz (118.2 kg)   SpO2 94%   BMI 42.06 kg/m   HEMODYNAMICS:    VENTILATOR SETTINGS: FiO2 (%):  [100 %] 100 %  INTAKE / OUTPUT: I/O last 3 completed shifts: In: 304 [IV Piggyback:304] Out: 675 [Urine:675]  PHYSICAL EXAMINATION: General: Acutely ill looking Neuro: Moaning and groaning, unable to follow commands, restless HEENT: Pupils sluggish, trachea midline, neck with moderate JVD Cardiovascular: Apical pulse tachycardic, S1-S2, no murmurs regurg or gallop, faint pulses bilaterally Lungs: Bilateral breath sounds with diffuse rhonchi in all lung fields Abdomen: Obese, hypoactive bowel sounds in all 4 quadrants Musculoskeletal: No joint deformities, positive range of motion Skin: Warm and dry  LABS:  BMET Recent Labs  Lab 11/19/17 0504 11/20/17 0617  12/08/2017 0022  NA 142 144 138  K 3.7 3.6 3.9  CL 113* 114* 107  CO2 19* 23 23  BUN 40* 39* 35*  CREATININE 1.29* 1.26* 1.12*  GLUCOSE 139* 157* 184*    Electrolytes Recent Labs  Lab 11/19/17 0504 11/20/17 0617 12/08/17 0022  CALCIUM 9.6 9.7 9.4    CBC Recent Labs  Lab 11/15/17 0507 11/17/17 0551 Dec 08, 2017 0022  WBC 3.5* 2.9* 11.7*  HGB 8.4* 7.8* 9.4*  HCT  24.7* 23.1* 28.0*  PLT 78* 65* 91*    Coag's Recent Labs  Lab 11/20/17 0617 11/21/17 0719 12-08-2017 0022  INR 2.90 2.74 2.54    Sepsis Markers No results for input(s): LATICACIDVEN, PROCALCITON, O2SATVEN in the last 168 hours.  ABG No results for input(s): PHART, PCO2ART, PO2ART in the last 168 hours.  Liver Enzymes Recent Labs  Lab 11/19/17 0504 11/20/17 0617 Dec 08, 2017 0022  AST 109* 90* 78*  ALT 43 42 43  ALKPHOS 55 51 65  BILITOT 5.4* 5.4* 6.1*  ALBUMIN 4.7 4.3 4.2    Cardiac Enzymes No results for input(s): TROPONINI, PROBNP in the last 168 hours.  Glucose Recent Labs  Lab 12/08/17 0053  GLUCAP 184*    Imaging Dg Chest 1 View  Result Date: 11/21/2017 CLINICAL DATA:  Acute onset shortness of breath EXAM: CHEST  1 VIEW COMPARISON:  11/21/2017 FINDINGS: Since the previous study there is increase of diffuse bilateral alveolar pulmonary infiltrates consistent with progressing edema or multifocal pneumonia. No blunting of costophrenic angles. No pneumothorax. Heart size is obscured by the parenchymal process but appears mildly enlarged. Calcification of the aorta. IMPRESSION: Increasing diffuse bilateral pulmonary infiltrates since previous study suggesting progressing edema or multifocal pneumonia. Electronically Signed   By: Burman Nieves M.D.   On: 11/21/2017 22:27   Dg Chest 1 View  Result Date: 11/21/2017 CLINICAL DATA:  Cough. Recent fall. Post laparoscopic gastric sleeve resection with dysfunctional uterine bleeding. EXAM: CHEST  1 VIEW COMPARISON:  09/19/2014 FINDINGS: Lungs are adequately inflated demonstrate patchy bilateral hazy opacification left worse than right likely infection and less likely asymmetric edema. No evidence of effusion. Cardiomediastinal silhouette and remainder the exam is unchanged. IMPRESSION: Hazy bilateral patchy opacification left greater than right likely infection and less likely asymmetric edema. Electronically Signed   By: Elberta Fortis M.D.   On: 11/21/2017 17:26   DISCUSSION: 42 year old female with an extensive complicated medical history presenting with acute hypoxic respiratory failure.  She is a DNR/DNI.  Patient's family has expressed the need to proceed with patient's wishes of being made comfortable should she have an impending respiratory failure.  Her chest x-ray shows an ARDS pattern suggestive of diffuse pulmonary edema and possible multifocal pneumonia.  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   Withdrawal of life-sustaining treatment protocol implemented: Patient placed on morphine drip with titration to comfort, lorazepam as needed for anxiety, rub with normal as needed for secretions and nasotracheal suctioning as needed and tolerated.  Chaplain placed to bedside.  Support provided to family.    Honesty Menta S[facility] ANP-BC Pulmonary and Critical Care Medicine [facility] Pager [phone] or [phone]  NB: This document was prepared using Dragon voice recognition software and may include unintentional dictation  errors.    11/24/2017, 2:31 AM

## 2017-11-25 NOTE — Progress Notes (Signed)
   Nov 27, 2017 0221  Clinical Encounter Type  Visited With Patient and family together  Visit Type Follow-up  Spiritual Encounters  Spiritual Needs Prayer   Received page regarding support for patient family at bedside.  Chaplain maintained pastoral presence then offered prayer for patient and her family.  Chaplain continued to hold space and offer silent prayer then asked family if they would like time alone together.  Family said yes; chaplain encouraged family to have chaplain paged as needed.

## 2017-11-25 DEATH — deceased

## 2017-11-27 LAB — VITAMIN K1, SERUM: VITAMIN K1: 1.62 ng/mL (ref 0.13–1.88)

## 2017-12-02 NOTE — Telephone Encounter (Signed)
Informed funeral home original death cert is ready for pick up. Nothing further needed.

## 2017-12-03 NOTE — Telephone Encounter (Signed)
[facility] called They have a question regarding the message they received  Please call [phone] to discuss

## 2017-12-03 NOTE — Telephone Encounter (Signed)
Spoke with funeral home and informed them that provider gave me the envelope that was with death cert to be mailed to [facility]. Nothing further needed.

## 2017-12-05 ENCOUNTER — Encounter: Payer: Self-pay | Admitting: Infectious Diseases

## 2017-12-05 DIAGNOSIS — R768 Other specified abnormal immunological findings in serum: Secondary | ICD-10-CM | POA: Insufficient documentation

## 2017-12-05 DIAGNOSIS — R894 Abnormal immunological findings in specimens from other organs, systems and tissues: Secondary | ICD-10-CM | POA: Insufficient documentation

## 2018-11-24 IMAGING — MR MR MRCP
9 of 12 series · 36 of 48 positions shown · non-contrast
Comparison: CT 09/22/2012

CLINICAL DATA: Cirrhosis. Surgical history of gastric sleeve and
cholecystectomy. Hyperbilirubinemia.

EXAM:
MRI ABDOMEN WITHOUT CONTRAST  (INCLUDING MRCP)
TECHNIQUE: Multiplanar multisequence MR imaging of the abdomen was performed.
Heavily T2-weighted images of the biliary and pancreatic ducts were
obtained, and three-dimensional MRCP images were rendered by post
processing.

[Series 4: T2 fat-sat · axial · 6.0mm · 0.89mm/px · z∈[-100,+130]mm · 2 of 33 slices shown]
[im 1/33]
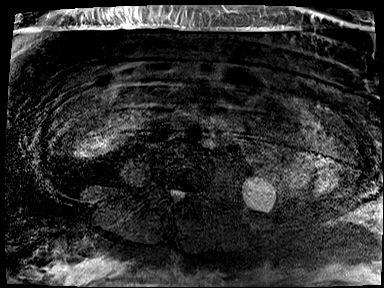
[im 33/33]
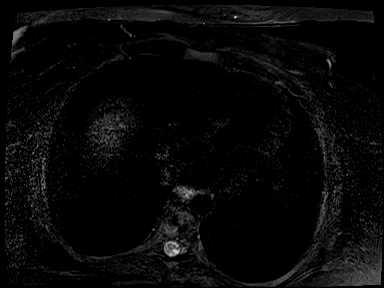

[Series 6: bSSFP · coronal · 4.0mm · 0.78mm/px · 4 of 51 slices shown]
[im 1/51]
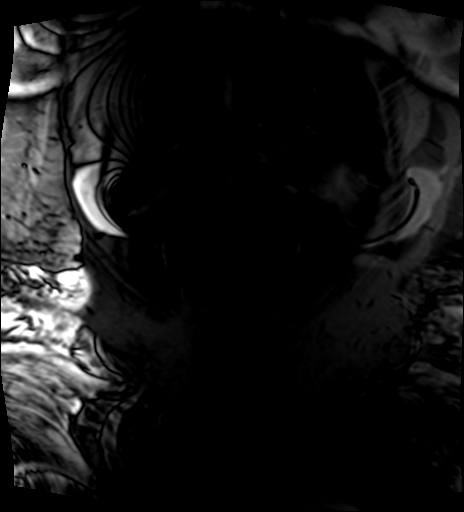
[im 17/51]
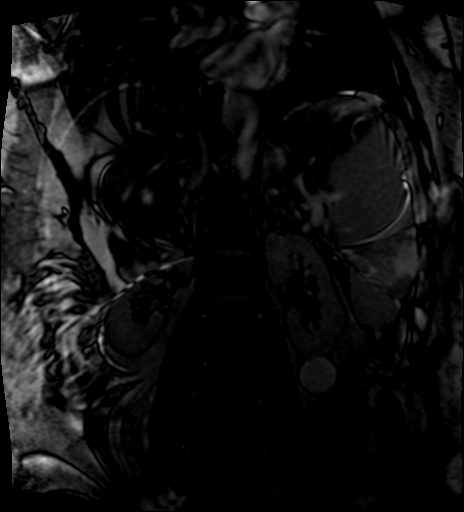
[im 34/51]
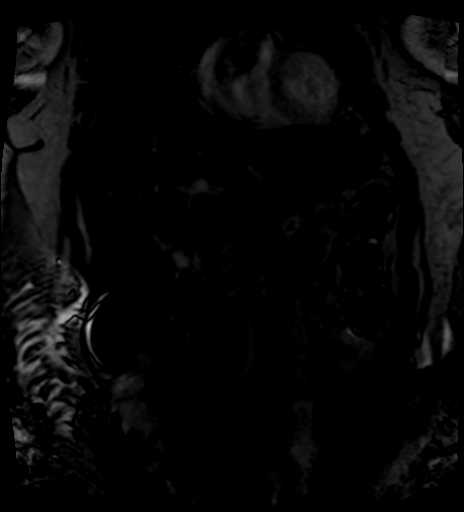
[im 51/51]
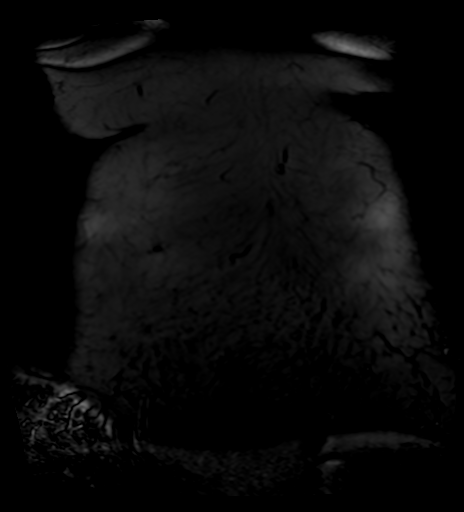

[Series 10: cor thins · coronal · 4.0mm · 0.94mm/px · 1 of 11 slices shown]
[im 1/11]
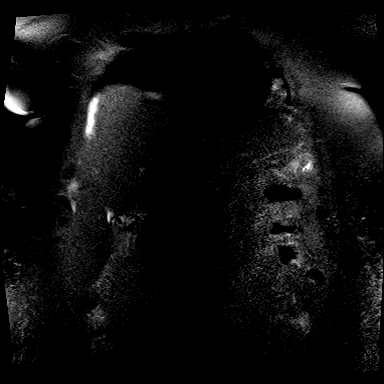

[Series 12: DWI · axial · 6.0mm · 1.77mm/px · z∈[-112,+161]mm · 8 of 117 slices shown]
[im 1/117]
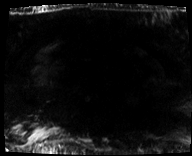
[im 13/117]
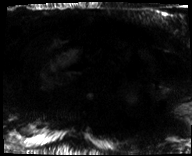
[im 39/117]
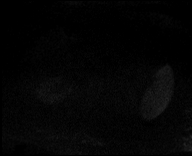
[im 52/117]
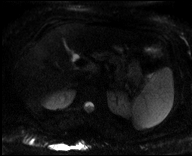
[im 65/117]
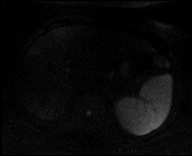
[im 78/117]
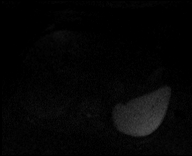
[im 104/117]
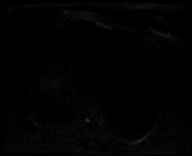
[im 117/117]
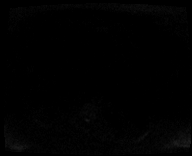

[Series 13: ax dwi_adc · axial · 6.0mm · 1.77mm/px · z∈[-112,+161]mm · 3 of 39 slices shown]
[im 1/39]
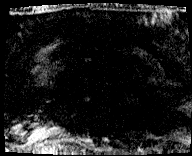
[im 20/39]
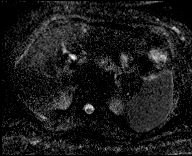
[im 39/39]
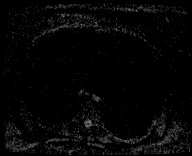

[Series 14: T2 · axial · 6.0mm · 1.52mm/px · z∈[-111,+141]mm · 3 of 36 slices shown]
[im 1/36]
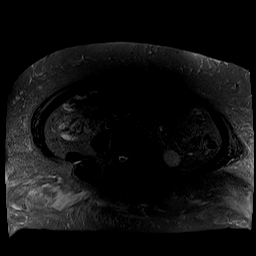
[im 18/36]
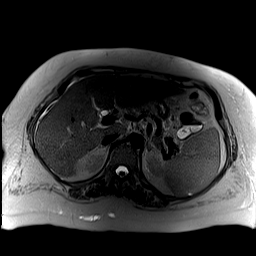
[im 36/36]
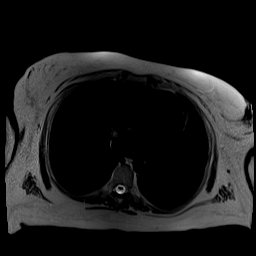

[Series 16: T1 dynamic fat-sat · axial · non-contrast · 2.5mm · 0.74mm/px · z∈[-104,+153]mm · 8 of 104 slices shown]
[im 1/104]
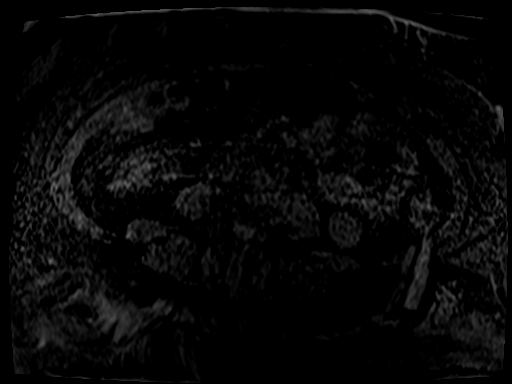
[im 13/104]
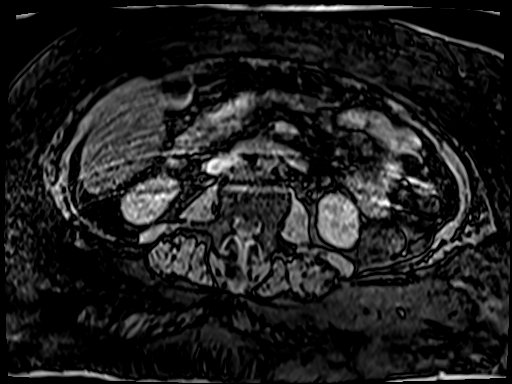
[im 26/104]
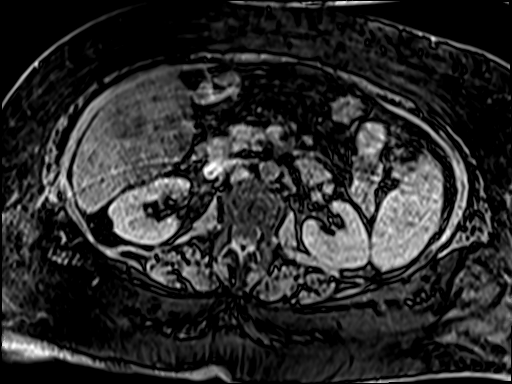
[im 39/104]
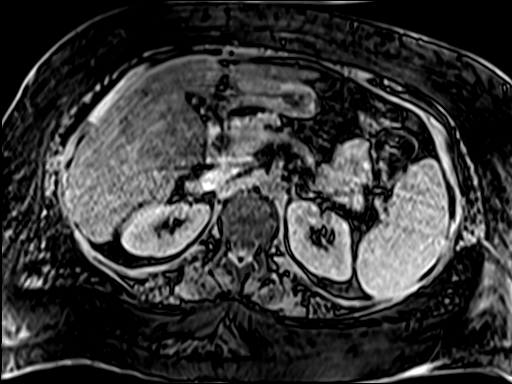
[im 65/104]
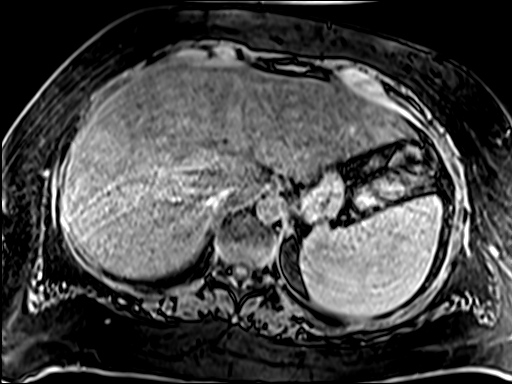
[im 78/104]
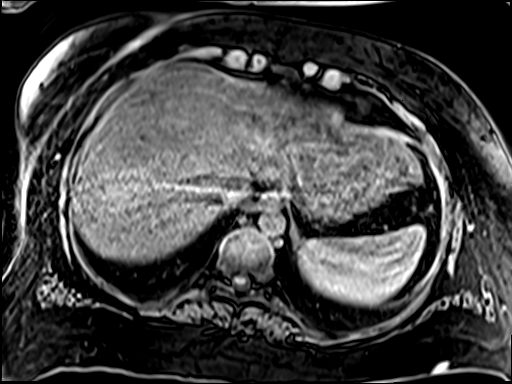
[im 91/104]
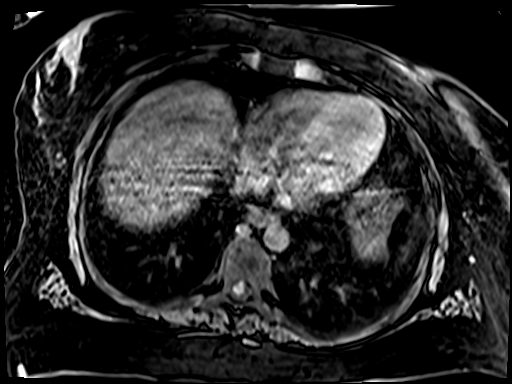
[im 104/104]
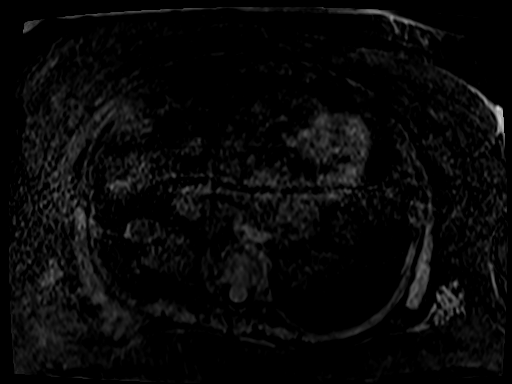

[Series 17: MRCP · coronal · 40.0mm · 0.91mm/px · 1 of 5 slices shown]
[im 1/5]
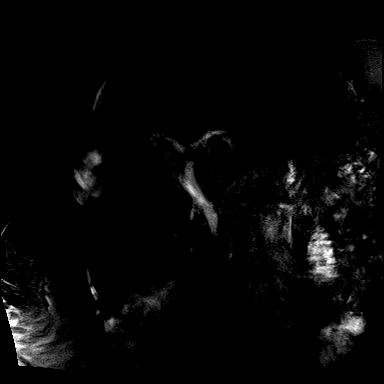

[Series 18: ax dual echo · axial · 6.0mm · 0.74mm/px · z∈[-112,+118]mm · 6 of 78 slices shown]
[im 1/78]
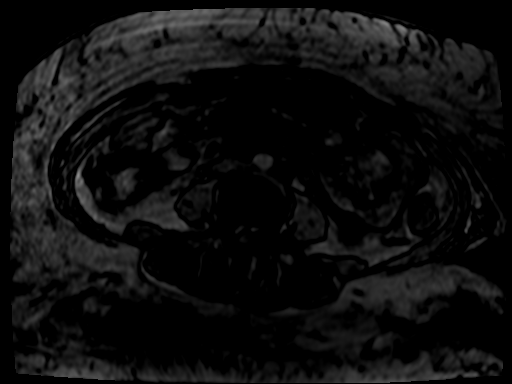
[im 13/78]
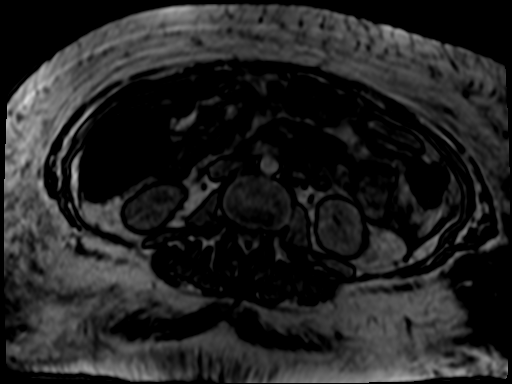
[im 26/78]
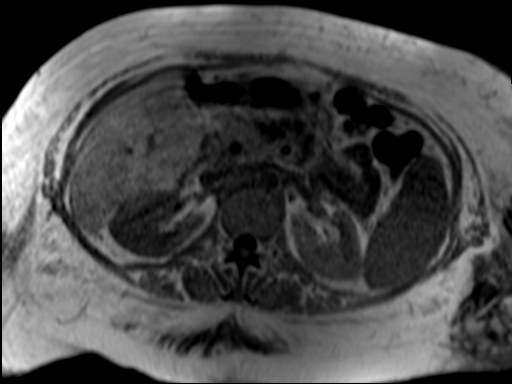
[im 39/78]
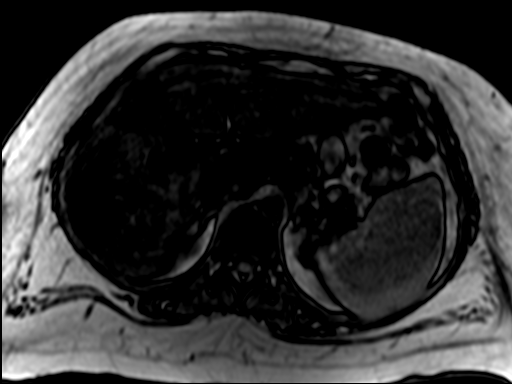
[im 52/78]
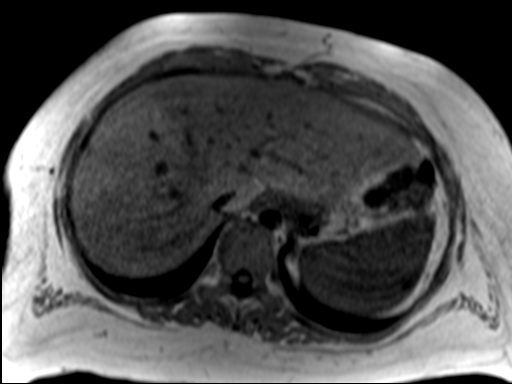
[im 65/78]
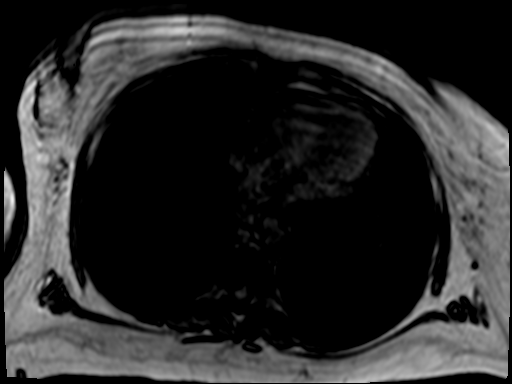

[36 of 48 positions shown; findings below may reference images not displayed]

FINDINGS: Exam is degraded by patient respiratory motion. Patient
encephalopathic and could not follow breath hold commands. Free
breathing technique use.

Lower chest:  Lung bases are clear.

Hepatobiliary: Marked loss of signal intensity on opposed phase
imaging consistent with hepatic steatosis (series 18). No
intrahepatic biliary duct dilatation. The common bile duct is mildly
dilated to 9 mm following cholecystectomy. No choledocholithiasis.
No IV contrast administered.

Pancreas: Normal pancreatic parenchymal intensity. No ductal
dilatation or inflammation.

Spleen: Moderate splenomegaly with the spleen measuring 16 cm in
craniocaudad dimension

Adrenals/urinary tract: Adrenal glands and kidneys are normal.

Stomach/Bowel: Stomach limited view of the bowel is unremarkable.

Vascular/Lymphatic: Normal caliber

Musculoskeletal: No aggressive osseous lesion
IMPRESSION: 1. No evidence of biliary obstruction. Mild extrahepatic duct
dilatation typical following cholecystectomy.
2. Severe hepatic steatosis.
3. No discrete lesion liver identified on noncontrast exam.
4. Mild splenomegaly.
5. No ascites.

## 2018-11-25 IMAGING — CR DG LUMBAR SPINE 2-3V
1 series · 3 of 3 positions shown · non-contrast
Comparison: None.

CLINICAL DATA: Low back pain for 2 years, recent fall with
increased pain, initial encounter

EXAM:
LUMBAR SPINE - 3 VIEW

[Series 1: dg lumbar spine 2-3 views · 0.14mm/px · 3 of 3 slices shown]
[im 1/3]
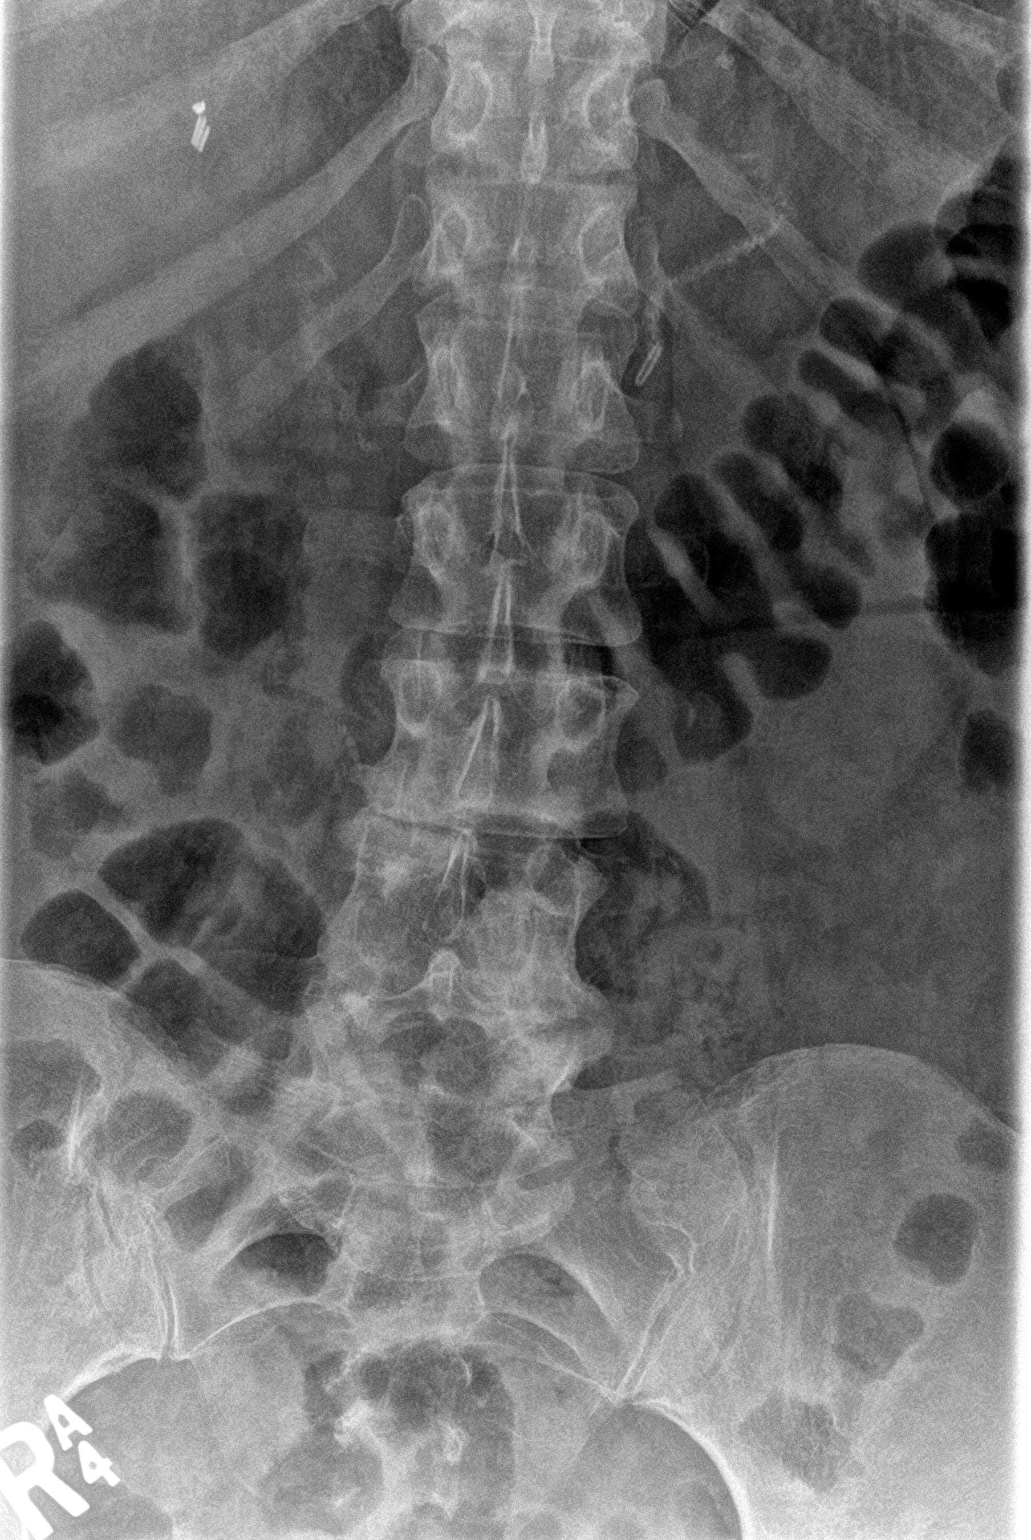
[im 2/3]
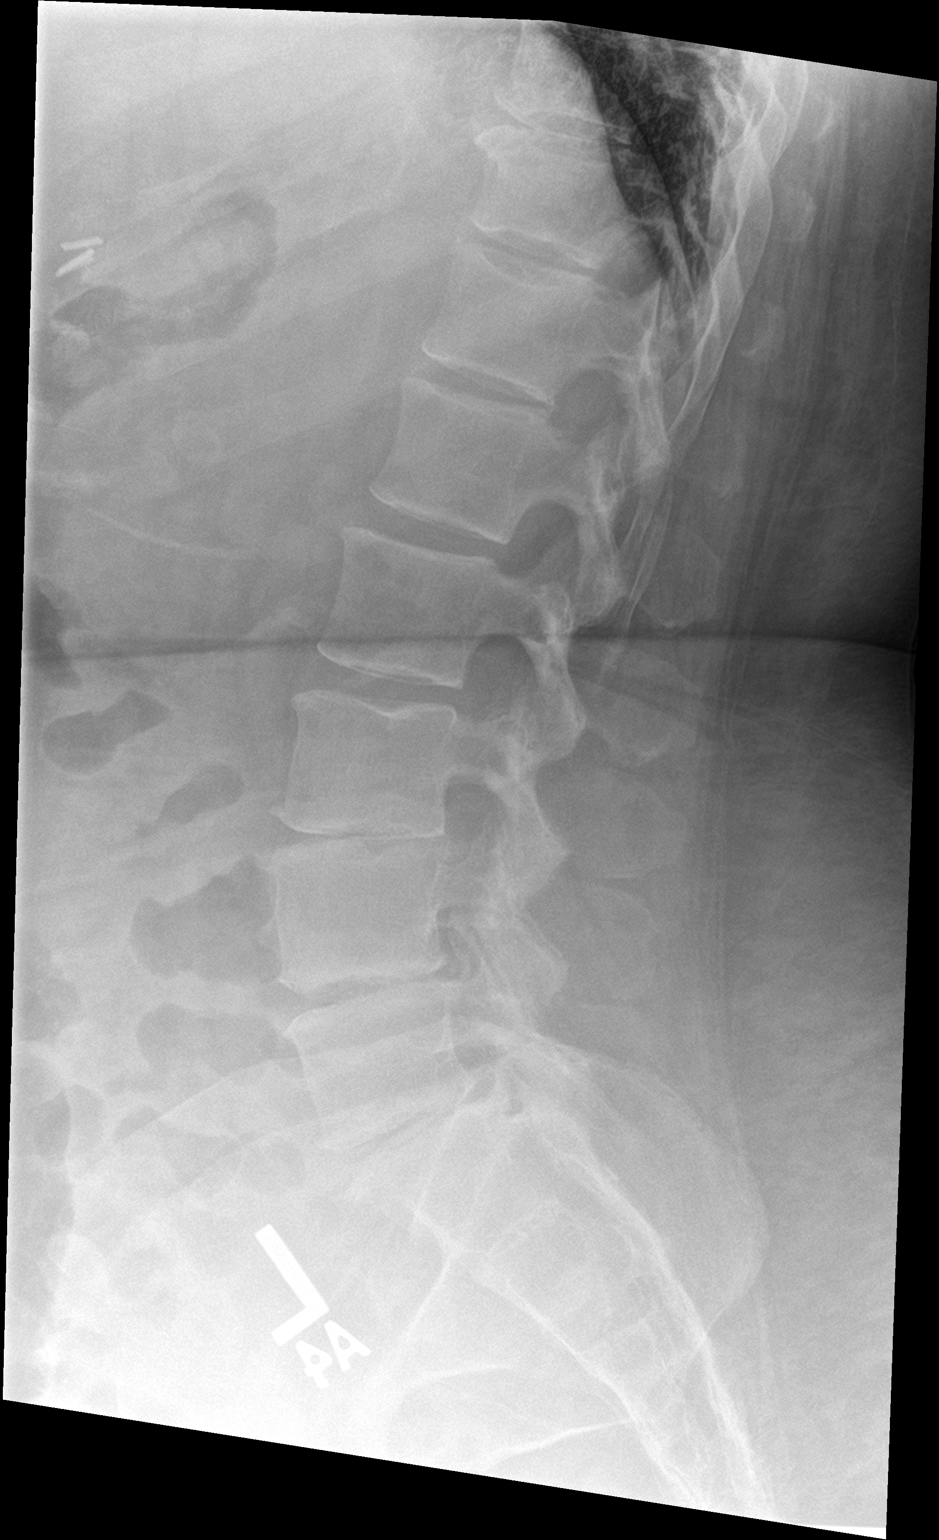
[im 3/3]
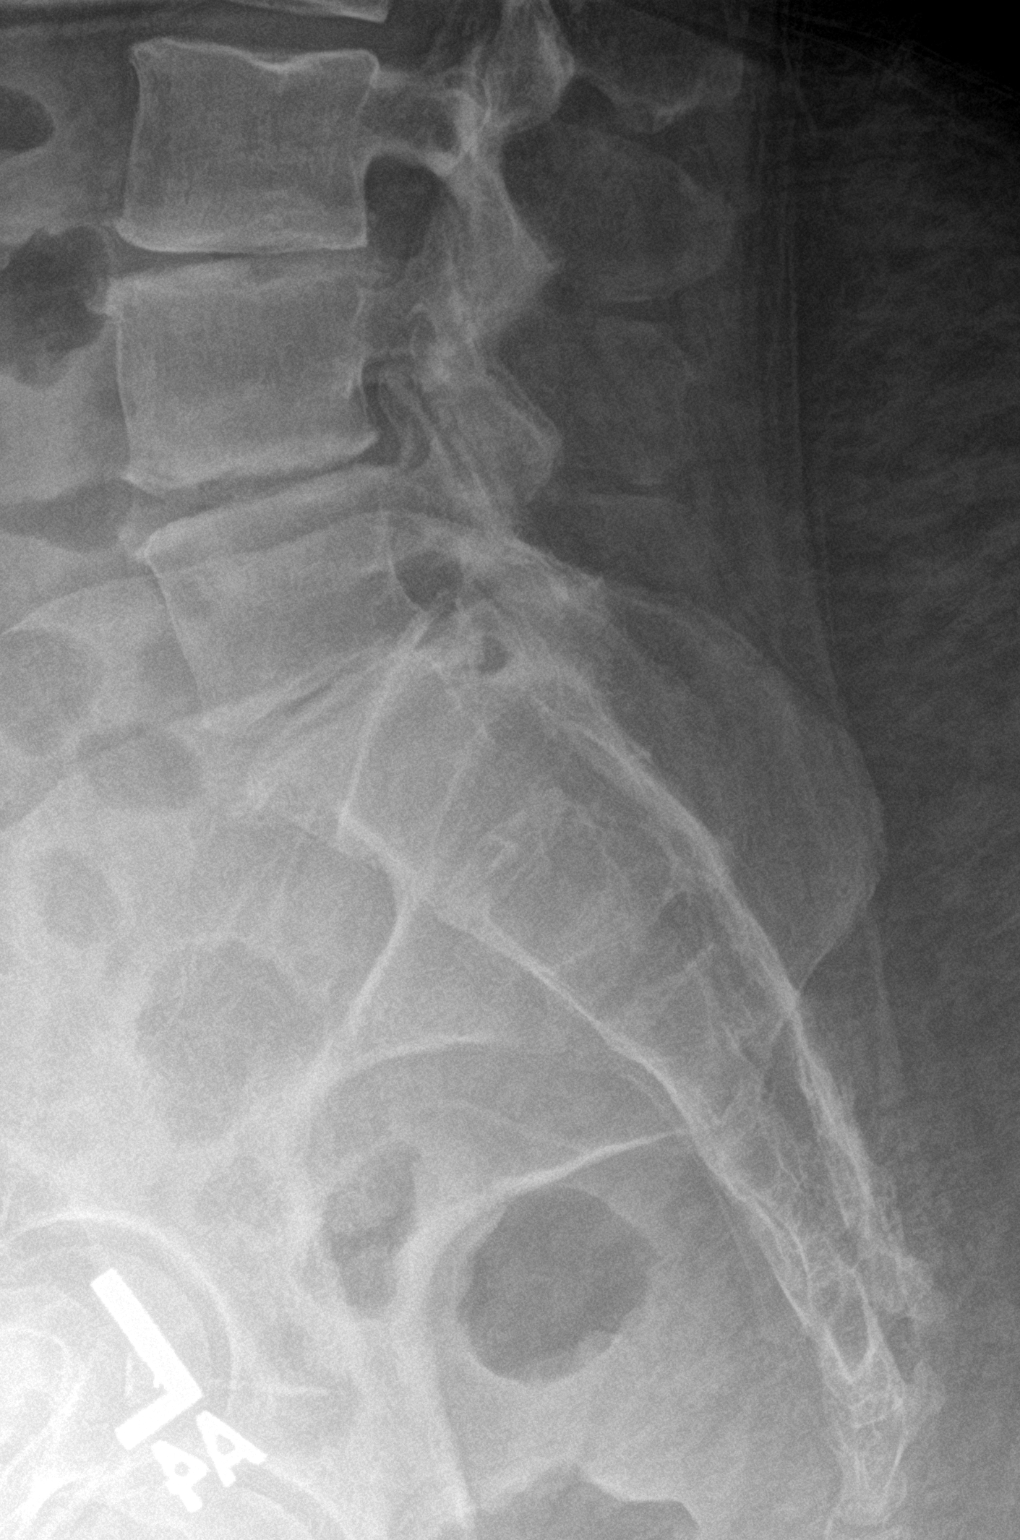

[3 of 3 positions shown; findings below may reference images not displayed]

FINDINGS: Five lumbar type vertebral bodies are well visualized. Disc space
narrowing is noted at L3-4, L4-5 and L5-S1. Mild osteophytic changes
are seen. No anterolisthesis is noted. No soft tissue changes are
noted.
IMPRESSION: Degenerative change without acute abnormality.

## 2018-12-06 IMAGING — DX DG CHEST 1V
1 series · 1 of 1 positions shown · non-contrast
Comparison: 09/19/2014

CLINICAL DATA: Cough. Recent fall. Post laparoscopic gastric sleeve
resection with dysfunctional uterine bleeding.

EXAM:
CHEST  1 VIEW

[chest ap]
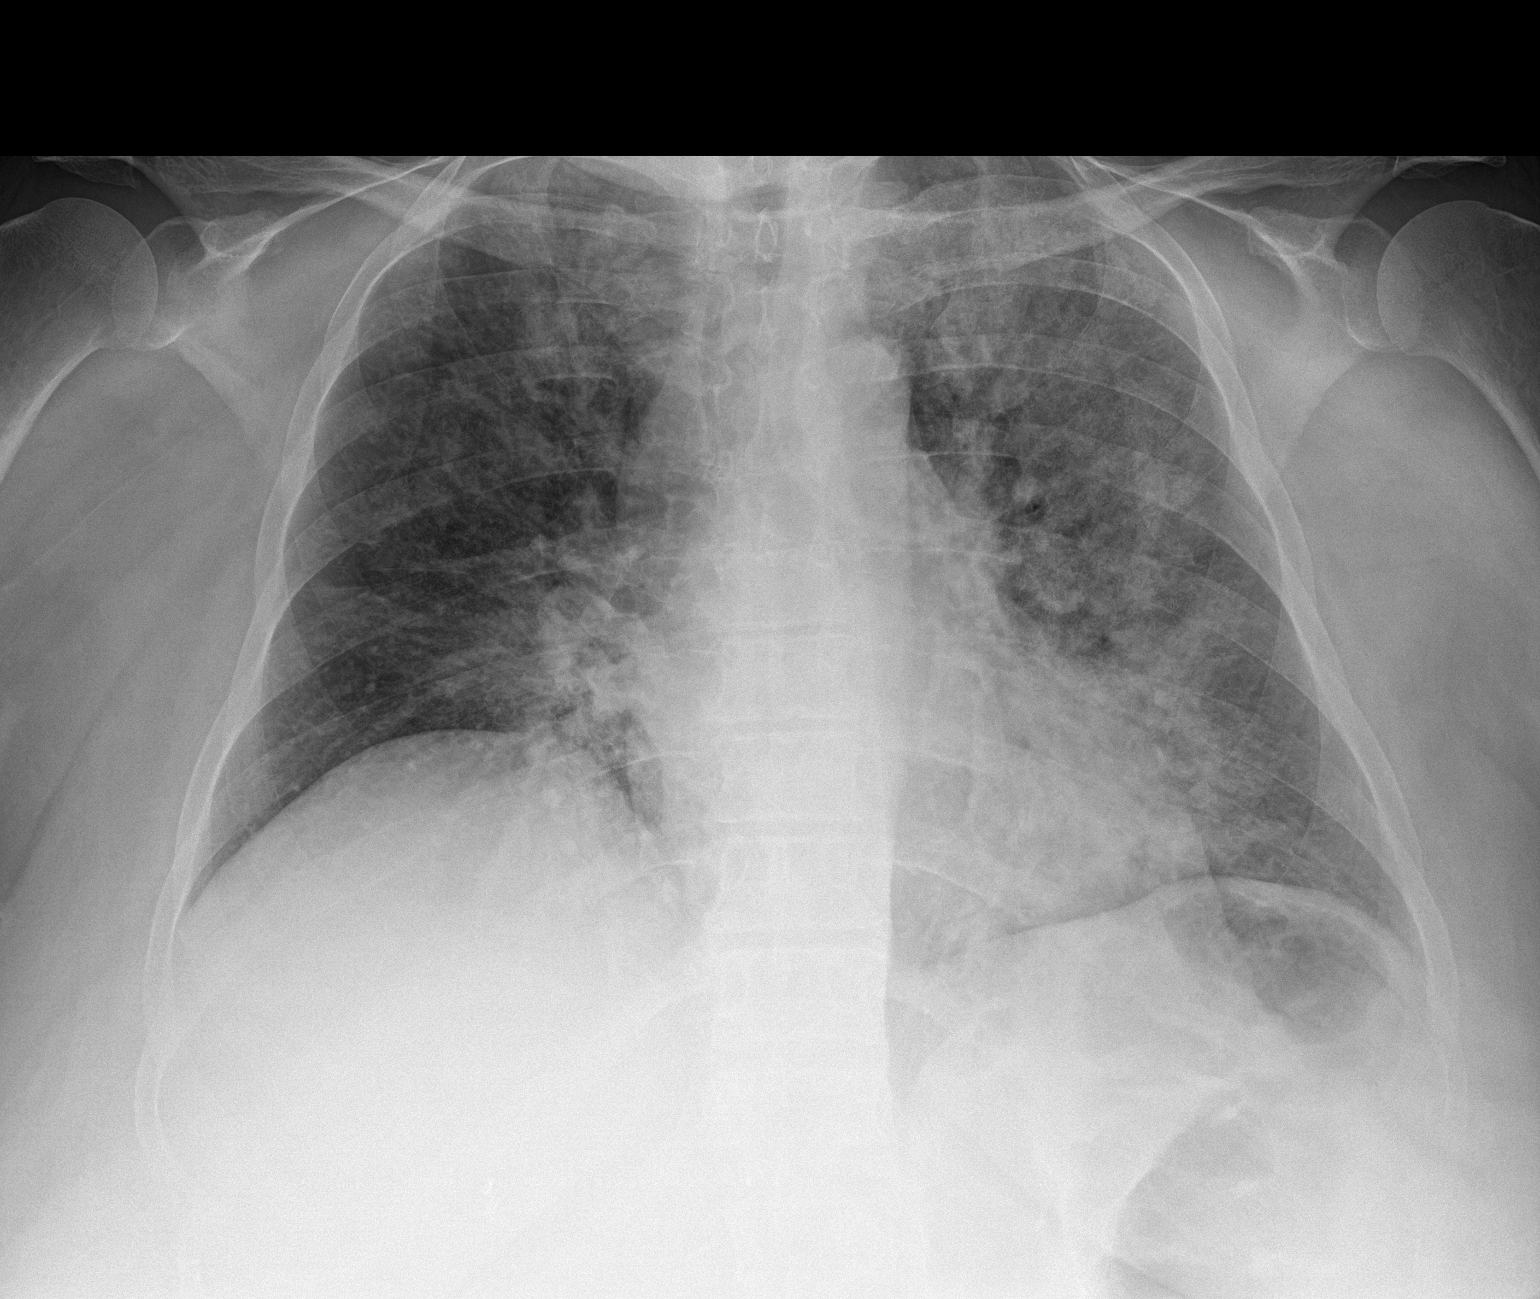

[1 of 1 positions shown; findings below may reference images not displayed]

FINDINGS: Lungs are adequately inflated demonstrate patchy bilateral hazy
opacification left worse than right likely infection and less likely
asymmetric edema. No evidence of effusion. Cardiomediastinal
silhouette and remainder the exam is unchanged.
IMPRESSION: Hazy bilateral patchy opacification left greater than right likely
infection and less likely asymmetric edema.
